# Patient Record
Sex: Male | Born: 1945 | Race: White | Hispanic: No | Marital: Married | Smoking: Never smoker
Health system: Southern US, Community
[De-identification: ages and names within clinical notes are randomized; demographics above are authoritative.]

## PROBLEM LIST (undated history)

## (undated) DIAGNOSIS — R7989 Other specified abnormal findings of blood chemistry: Secondary | ICD-10-CM

## (undated) DIAGNOSIS — E74818 Other disorders of glucose transport: Secondary | ICD-10-CM

## (undated) DIAGNOSIS — L4050 Arthropathic psoriasis, unspecified: Secondary | ICD-10-CM

## (undated) DIAGNOSIS — E748 Other specified disorders of carbohydrate metabolism: Secondary | ICD-10-CM

## (undated) DIAGNOSIS — I10 Essential (primary) hypertension: Secondary | ICD-10-CM

## (undated) DIAGNOSIS — M1990 Unspecified osteoarthritis, unspecified site: Secondary | ICD-10-CM

## (undated) DIAGNOSIS — E291 Testicular hypofunction: Secondary | ICD-10-CM

## (undated) DIAGNOSIS — E785 Hyperlipidemia, unspecified: Secondary | ICD-10-CM

## (undated) DIAGNOSIS — Z860100 Personal history of colon polyps, unspecified: Secondary | ICD-10-CM

## (undated) DIAGNOSIS — K5010 Crohn's disease of large intestine without complications: Secondary | ICD-10-CM

## (undated) DIAGNOSIS — M810 Age-related osteoporosis without current pathological fracture: Secondary | ICD-10-CM

## (undated) DIAGNOSIS — R945 Abnormal results of liver function studies: Secondary | ICD-10-CM

## (undated) HISTORY — DX: Age-related osteoporosis without current pathological fracture: M81.0

## (undated) HISTORY — DX: Unspecified osteoarthritis, unspecified site: M19.90

## (undated) HISTORY — DX: Testicular hypofunction: E29.1

## (undated) HISTORY — DX: Personal history of colonic polyps: Z86.010

## (undated) HISTORY — DX: Other disorders of glucose transport: E74.818

## (undated) HISTORY — DX: Personal history of colon polyps, unspecified: Z86.0100

## (undated) HISTORY — DX: Abnormal results of liver function studies: R94.5

## (undated) HISTORY — DX: Hyperlipidemia, unspecified: E78.5

## (undated) HISTORY — PX: POLYPECTOMY: SHX149

## (undated) HISTORY — DX: Other specified disorders of carbohydrate metabolism: E74.8

## (undated) HISTORY — DX: Other specified abnormal findings of blood chemistry: R79.89

## (undated) HISTORY — DX: Arthropathic psoriasis, unspecified: L40.50

## (undated) HISTORY — DX: Crohn's disease of large intestine without complications: K50.10

## (undated) HISTORY — DX: Essential (primary) hypertension: I10

## (undated) HISTORY — PX: COLONOSCOPY: SHX174

## (undated) HISTORY — PX: TONSILLECTOMY: SUR1361

---

## 2004-09-03 ENCOUNTER — Ambulatory Visit: Payer: Self-pay | Admitting: Gastroenterology

## 2004-09-18 ENCOUNTER — Ambulatory Visit: Payer: Self-pay | Admitting: Gastroenterology

## 2008-12-01 ENCOUNTER — Ambulatory Visit (HOSPITAL_COMMUNITY): Admission: RE | Admit: 2008-12-01 | Discharge: 2008-12-01 | Payer: Self-pay | Admitting: Internal Medicine

## 2009-08-22 ENCOUNTER — Encounter (INDEPENDENT_AMBULATORY_CARE_PROVIDER_SITE_OTHER): Payer: Self-pay | Admitting: *Deleted

## 2009-11-22 ENCOUNTER — Encounter: Payer: Self-pay | Admitting: Gastroenterology

## 2009-11-27 ENCOUNTER — Encounter (INDEPENDENT_AMBULATORY_CARE_PROVIDER_SITE_OTHER): Payer: Self-pay | Admitting: *Deleted

## 2010-01-22 ENCOUNTER — Encounter (INDEPENDENT_AMBULATORY_CARE_PROVIDER_SITE_OTHER): Payer: Self-pay | Admitting: *Deleted

## 2010-01-24 ENCOUNTER — Ambulatory Visit: Payer: Self-pay | Admitting: Gastroenterology

## 2010-02-13 ENCOUNTER — Ambulatory Visit: Payer: Self-pay | Admitting: Gastroenterology

## 2010-07-03 NOTE — Miscellaneous (Signed)
Summary: [facility] Previsit/prep  Clinical Lists Changes  Medications: Added new medication of MOVIPREP 100 GM  SOLR (PEG-KCL-NACL-NASULF-NA ASC-C) As per prep instructions. - Signed Rx of MOVIPREP 100 GM  SOLR (PEG-KCL-NACL-NASULF-NA ASC-C) As per prep instructions.;  #1 x 0;  Signed;  Entered by: [name] RN;  Authorized by: Inda Castle MD;  Method used: Electronically to [facility]. # [phone]*, [address], [place], [place], [place]  [address], Ph: [phone], Fax: [phone] Observations: Added new observation of NKA: T (01/24/2010 8:49)    Prescriptions: MOVIPREP 100 GM  SOLR (PEG-KCL-NACL-NASULF-NA ASC-C) As per prep instructions.  #1 x 0   Entered by:   [name] RN   Authorized by:   Inda Castle MD   Signed by:   [name] RN on 01/24/2010   Method used:   Electronically to        [facility]. # [phone]* (retail)       [address] [place], [place]  [address]       Ph: [phone]       Fax: [phone]   RxID:   [id number]

## 2010-07-03 NOTE — Procedures (Signed)
Summary: Colonoscopy  Patient: Melvin Shelton Note: All result statuses are Final unless otherwise noted.  Tests: (1) Colonoscopy (COL)   COL Colonoscopy           [facility].     [place], [place]  [address]           COLONOSCOPY PROCEDURE REPORT           PATIENT:  Melvin, Shelton  MR#:  [id number]     BIRTHDATE:  January 01, 1946, 34 yrs. old  GENDER:  male           ENDOSCOPIST:  Mister Krahenbuhl. Deatra Ina, MD     Referred by:           PROCEDURE DATE:  02/13/2010     PROCEDURE:  Diagnostic Colonoscopy     ASA CLASS:  Class I     INDICATIONS:  1) screening  2) history of pre-cancerous     (adenomatous) colon polyps           MEDICATIONS:   Fentanyl 75 mcg IV, Versed 7 mg IV           DESCRIPTION OF PROCEDURE:   After the risks benefits and     alternatives of the procedure were thoroughly explained, informed     consent was obtained.  Digital rectal exam was performed and     revealed no abnormalities.   The [id number] endoscope was     introduced through the anus and advanced to the cecum, which was     identified by both the appendix and ileocecal valve, without     limitations.  The quality of the prep was excellent, using     MoviPrep.  The instrument was then slowly withdrawn as the colon     was fully examined.     <<PROCEDUREIMAGES>>           FINDINGS:  Mild diverticulosis was found in the sigmoid colon (see     image16).  This was otherwise a normal examination of the colon     (see image1, image3, image5, image6, image7, image10, image12,     image14, image19, and image20).   Retroflexed views in the rectum     revealed no abnormalities.    The time to cecum =  3.0  minutes.     The scope was then withdrawn (time =  7.50  min) from the patient     and the procedure completed.           COMPLICATIONS:  None           ENDOSCOPIC IMPRESSION:     1) Mild diverticulosis in the sigmoid colon     2) Otherwise normal examination  RECOMMENDATIONS:     1) Colonoscopy           REPEAT EXAM:  In 7 year(s) for Colonoscopy.           ______________________________     Sandy Salaam. Deatra Ina, MD           CC: Rachel Moulds, DO           n.     eSIGNED:   Sandy Salaam. Kaplan at 02/13/2010 02:35 PM           Kennieth Francois, [id number]  Note: An exclamation mark (!) indicates a result that was not dispersed into the flowsheet. Document Creation Date: 02/13/2010 2:35 PM _______________________________________________________________________  (1)  Order result status: Final Collection or observation date-time: 02/13/2010 14:29 Requested date-time:  Receipt date-time:  Reported date-time:  Referring Physician:   Ordering Physician: Erskine Emery [phone]) Specimen Source:  Source: Tawanna Cooler Order Number: [id number] Lab site:   Appended Document: Colonoscopy    Clinical Lists Changes  Observations: Added new observation of COLONNXTDUE: 02/2017 (02/13/2010 14:43)

## 2010-07-03 NOTE — Letter (Signed)
Summary: Colonoscopy Letter  [facility]  [place], [place] [address]   Phone: [phone]  Fax: [phone]      August 22, 2009 MRN: [id number]   Melvin Shelton [place], [place]  [address]   Dear Mr. Canavan,   According to your medical record, it is time for you to schedule a Colonoscopy. The American Cancer Society recommends this procedure as a method to detect early colon cancer. Patients with a family history of colon cancer, or a personal history of colon polyps or inflammatory bowel disease are at increased risk.  This letter has been generated based on the recommendations made at the time of your procedure. If you feel that in your particular situation this may no longer apply, please contact our office.  Please call our office at [phone] to schedule this appointment or to update your records at your earliest convenience.  Thank you for cooperating with [place] to provide you with the very best care possible.   Sincerely,  Sandy Salaam. Deatra Ina, M.D.  [facility] Gastroenterology Division [phone]

## 2010-07-03 NOTE — Letter (Signed)
Summary: [facility] Instructions  [facility]  [place], [place] [address]   Phone: [phone]  Fax: [phone]       Melvin Shelton    05/11/1946    MRN: [id number]        Procedure Day Melvin Shelton:  Melvin Shelton  02/13/10     Arrival Time:  1:00PM     Procedure Time:  2:00PM     Location of Procedure:                    _X _  [name] (4th Floor)                       [place]   Starting 5 days prior to your procedure 02/08/10 do not eat nuts, seeds, popcorn, corn, beans, peas,  salads, or any raw vegetables.  Do not take any fiber supplements (e.g. Metamucil, Citrucel, and Benefiber).  THE DAY BEFORE YOUR PROCEDURE         DATE:  02/12/10  DAY: MONDAY  1.  Drink clear liquids the entire day-NO SOLID FOOD  2.  Do not drink anything colored red or purple.  Avoid juices with pulp.  No orange juice.  3.  Drink at least 64 oz. (8 glasses) of fluid/clear liquids during the day to prevent dehydration and help the prep work efficiently.  CLEAR LIQUIDS INCLUDE: Water Jello Ice Popsicles Tea (sugar ok, no milk/cream) Powdered fruit flavored drinks Coffee (sugar ok, no milk/cream) Gatorade Juice: apple, white grape, white cranberry  Lemonade Clear bullion, consomm, broth Carbonated beverages (any kind) Strained chicken noodle soup Hard Candy                             4.  In the morning, mix first dose of MoviPrep solution:    Empty 1 Pouch A and 1 Pouch B into the disposable container    Add lukewarm drinking water to the top line of the container. Mix to dissolve    Refrigerate (mixed solution should be used within 24 hrs)  5.  Begin drinking the prep at 5:00 p.m. The MoviPrep container is divided by 4 marks.   Every 15 minutes drink the solution down to the next mark (approximately 8 oz) until the full liter is complete.   6.  Follow completed prep with 16 oz of clear liquid of your choice (Nothing  red or purple).  Continue to drink clear liquids until bedtime.  7.  Before going to bed, mix second dose of MoviPrep solution:    Empty 1 Pouch A and 1 Pouch B into the disposable container    Add lukewarm drinking water to the top line of the container. Mix to dissolve    Refrigerate  THE DAY OF YOUR PROCEDURE      DATE: 02/13/10  DAY:  TUESDAY  Beginning at 9:00AM (5 hours before procedure):         1. Every 15 minutes, drink the solution down to the next mark (approx 8 oz) until the full liter is complete.  2. Follow completed prep with 16 oz. of clear liquid of your choice.    3. You may drink clear liquids until 12:00PM (2 HOURS BEFORE PROCEDURE).   MEDICATION INSTRUCTIONS  Unless otherwise instructed, you should take regular prescription medications with a small sip of water   as early as possible the  morning of your procedure.        OTHER INSTRUCTIONS  You will need a responsible adult at least 63 years of age to accompany you and drive you home.   This person must remain in the waiting room during your procedure.  Wear loose fitting clothing that is easily removed.  Leave jewelry and other valuables at home.  However, you may wish to bring a book to read or  an iPod/MP3 player to listen to music as you wait for your procedure to start.  Remove all body piercing jewelry and leave at home.  Total time from sign-in until discharge is approximately 2-3 hours.  You should go home directly after your procedure and rest.  You can resume normal activities the  day after your procedure.  The day of your procedure you should not:   Drive   Make legal decisions   Operate machinery   Drink alcohol   Return to work  You will receive specific instructions about eating, activities and medications before you leave.    The above instructions have been reviewed and explained to me by  Melvin Monte RN  January 24, 2010 9:17 AM     I fully understand and can  verbalize these instructions _____________________________ Date _________

## 2010-07-03 NOTE — Letter (Signed)
Summary: Previsit letter  [facility]  [place], [place] [address]   Phone: [phone]  Fax: [phone]       11/27/2009 MRN: [id number]  [facility] [place], [place]  [address]  Dear Melvin Shelton,  Welcome to the Gastroenterology Division at [facility].    You are scheduled to see a nurse for your pre-procedure visit on 01-24-10 at [facility] on the 3rd floor at [facility] [place], [place] [employer].  We ask that you try to arrive at our office 15 minutes prior to your appointment time to allow for check-in.  Your nurse visit will consist of discussing your medical and surgical history, your immediate family medical history, and your medications.    Please bring a complete list of all your medications or, if you prefer, bring the medication bottles and we will list them.  We will need to be aware of both prescribed and over the counter drugs.  We will need to know exact dosage information as well.  If you are on blood thinners (Coumadin, Plavix, Aggrenox, Ticlid, etc.) please call our office today/prior to your appointment, as we need to consult with your physician about holding your medication.   Please be prepared to read and sign documents such as consent forms, a financial agreement, and acknowledgement forms.  If necessary, and with your consent, a friend or relative is welcome to sit-in on the nurse visit with you.  Please bring your insurance card so that we may make a copy of it.  If your insurance requires a referral to see a specialist, please bring your referral form from your primary care physician.  No co-pay is required for this nurse visit.     If you cannot keep your appointment, please call [phone] to cancel or reschedule prior to your appointment date.  This allows [place] the opportunity to schedule an appointment for another patient in need of care.    Thank you for choosing [facility] for your medical  needs.  We appreciate the opportunity to care for you.  Please visit [place] at our website  to learn more about our practice.                     Sincerely.                                                                                                                   The Gastroenterology Division

## 2010-07-03 NOTE — Letter (Signed)
Summary: [facility]  [facility]   Imported By: Phillis Knack 11/30/2009 09:30:45  _____________________________________________________________________  External Attachment:    Type:   Image     Comment:   External Document

## 2011-06-17 ENCOUNTER — Ambulatory Visit (HOSPITAL_COMMUNITY)
Admission: RE | Admit: 2011-06-17 | Discharge: 2011-06-17 | Disposition: A | Discharge status: Home / Self Care | Payer: BC Managed Care – PPO | Source: Ambulatory Visit | Attending: Internal Medicine | Admitting: Internal Medicine

## 2011-06-17 ENCOUNTER — Other Ambulatory Visit (HOSPITAL_COMMUNITY): Payer: Self-pay | Admitting: Internal Medicine

## 2011-06-17 DIAGNOSIS — R059 Cough, unspecified: Secondary | ICD-10-CM | POA: Insufficient documentation

## 2011-06-17 DIAGNOSIS — R05 Cough: Secondary | ICD-10-CM

## 2013-04-22 ENCOUNTER — Other Ambulatory Visit: Payer: Self-pay | Admitting: Emergency Medicine

## 2013-04-22 MED ORDER — CLOBETASOL PROP EMOLLIENT BASE 0.05 % EX CREA: TOPICAL_CREAM | CUTANEOUS | Status: DC

## 2013-05-08 IMAGING — CR DG CHEST 2V
2 series · 2 of 2 positions shown · non-contrast
Comparison: None.

CLINICAL DATA: Cough

CHEST - 2 VIEW

[view not recorded (1 of 2)]
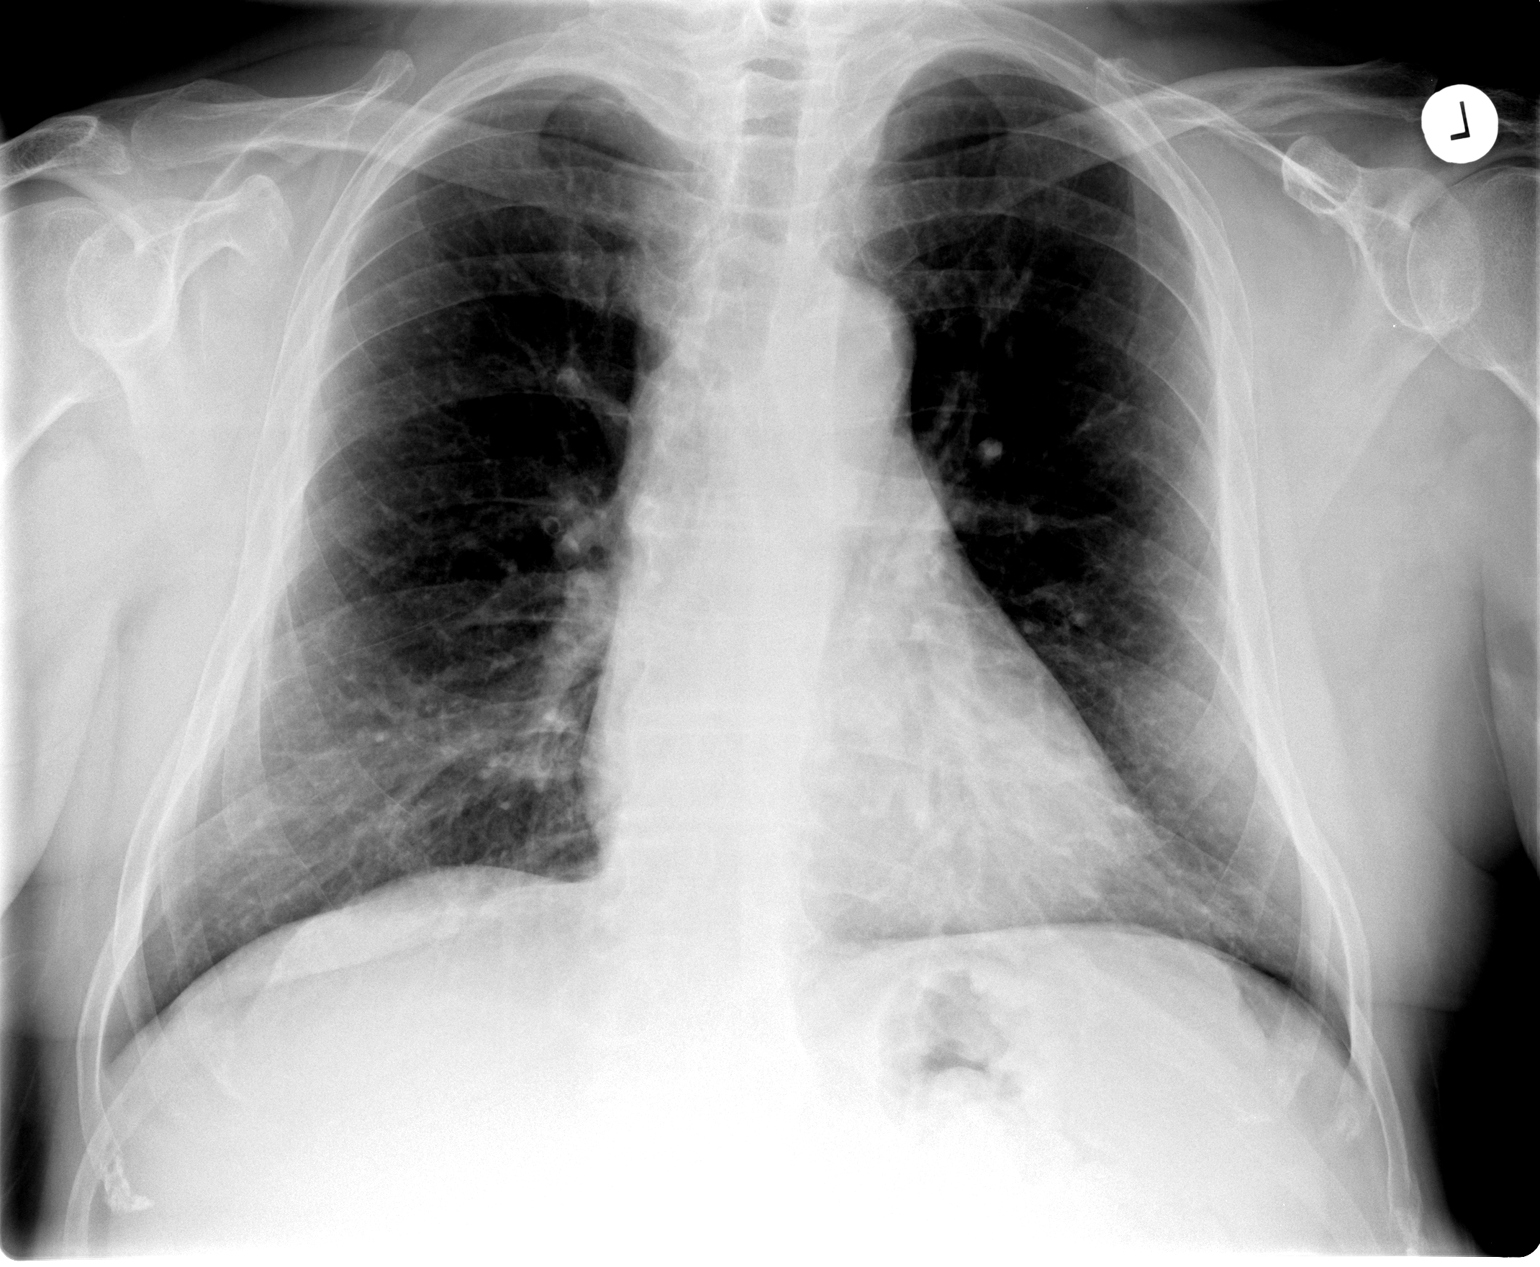

[view not recorded (2 of 2)]
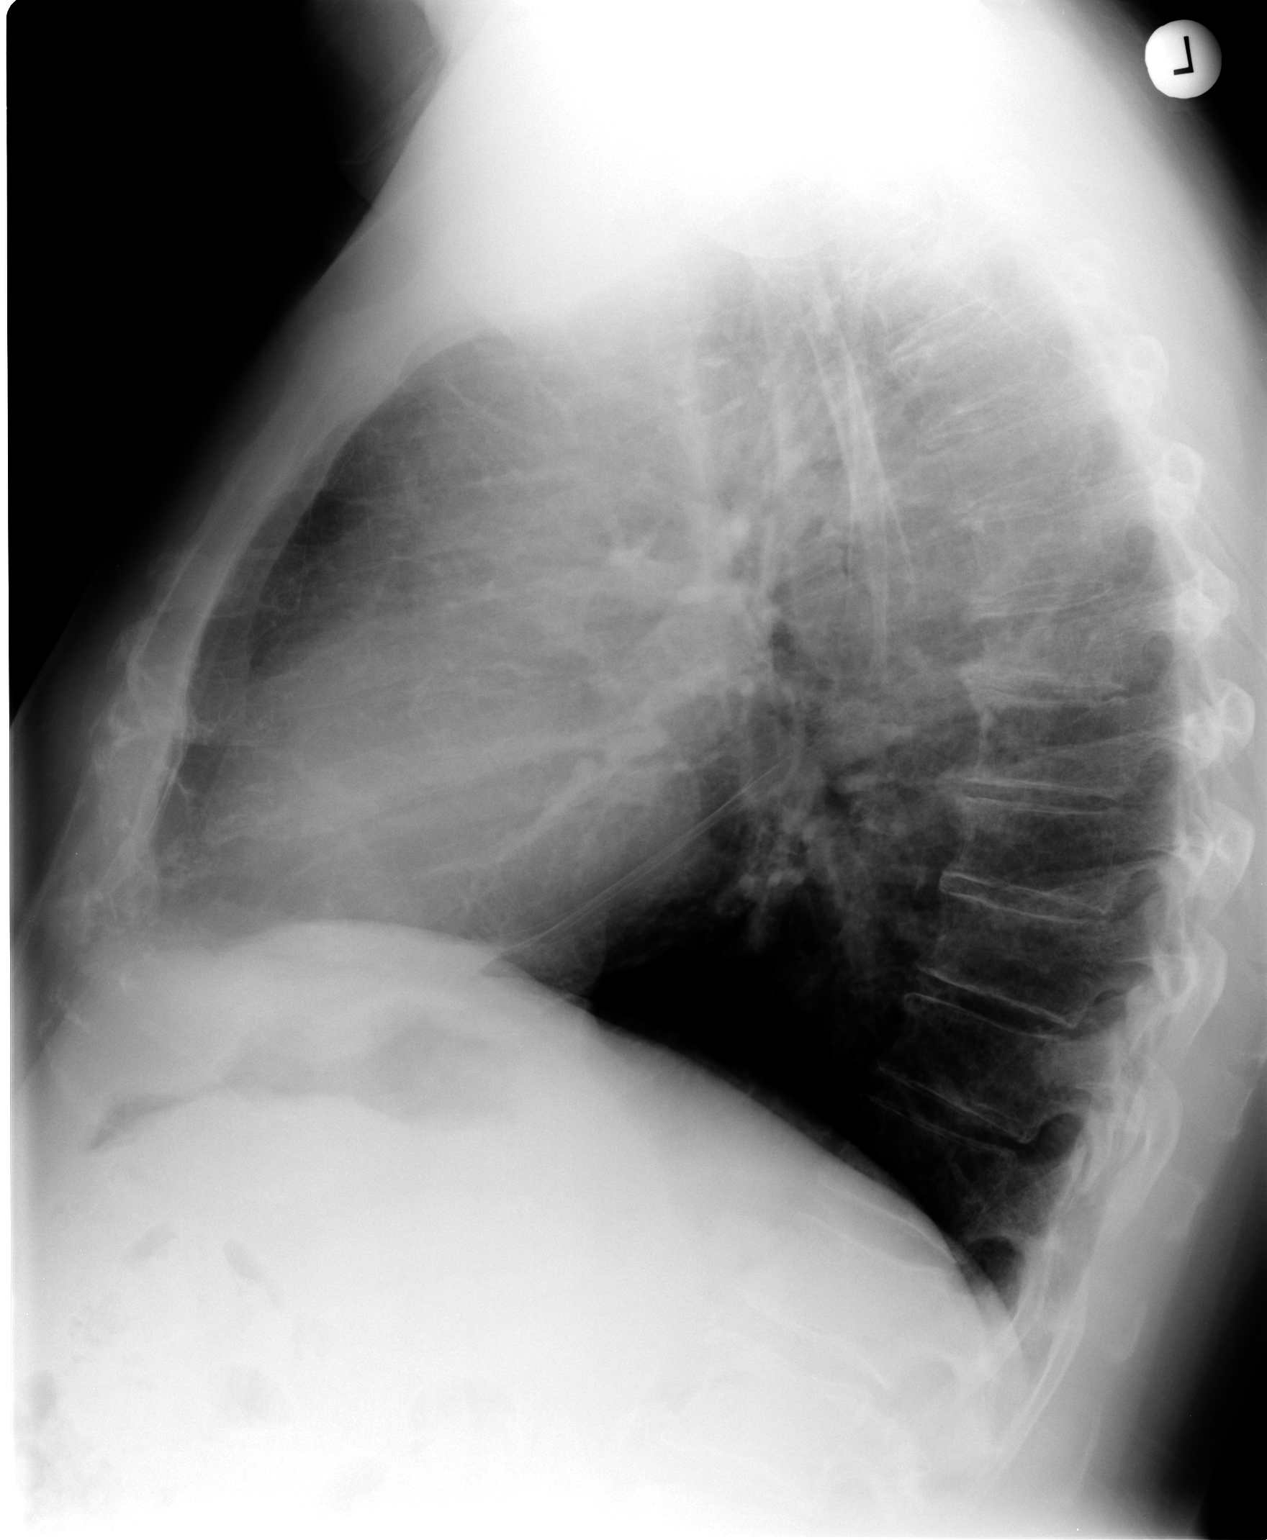

[2 of 2 positions shown; findings below may reference images not displayed]

FINDINGS: Heart size is normal.  Mediastinal shadows are normal.
The lungs are clear.  No effusions.  Ordinary degenerative changes
effect the spine.
IMPRESSION: No active disease

## 2013-06-16 ENCOUNTER — Other Ambulatory Visit: Payer: Self-pay | Admitting: Internal Medicine

## 2013-08-11 ENCOUNTER — Other Ambulatory Visit: Payer: Self-pay | Admitting: Emergency Medicine

## 2013-09-06 ENCOUNTER — Encounter: Payer: Self-pay | Admitting: *Deleted

## 2013-09-06 DIAGNOSIS — K760 Fatty (change of) liver, not elsewhere classified: Secondary | ICD-10-CM | POA: Insufficient documentation

## 2013-09-06 DIAGNOSIS — M810 Age-related osteoporosis without current pathological fracture: Secondary | ICD-10-CM | POA: Insufficient documentation

## 2013-09-06 DIAGNOSIS — L4050 Arthropathic psoriasis, unspecified: Secondary | ICD-10-CM | POA: Insufficient documentation

## 2013-09-06 DIAGNOSIS — E785 Hyperlipidemia, unspecified: Secondary | ICD-10-CM | POA: Insufficient documentation

## 2013-09-06 DIAGNOSIS — E1169 Type 2 diabetes mellitus with other specified complication: Secondary | ICD-10-CM | POA: Insufficient documentation

## 2013-09-06 DIAGNOSIS — R7989 Other specified abnormal findings of blood chemistry: Secondary | ICD-10-CM | POA: Insufficient documentation

## 2013-09-06 DIAGNOSIS — R945 Abnormal results of liver function studies: Secondary | ICD-10-CM

## 2013-09-06 DIAGNOSIS — Z86010 Personal history of colonic polyps: Secondary | ICD-10-CM | POA: Insufficient documentation

## 2013-09-06 DIAGNOSIS — I10 Essential (primary) hypertension: Secondary | ICD-10-CM | POA: Insufficient documentation

## 2013-09-08 ENCOUNTER — Ambulatory Visit: Payer: Self-pay | Admitting: Emergency Medicine

## 2013-09-16 ENCOUNTER — Other Ambulatory Visit: Payer: Self-pay | Admitting: Emergency Medicine

## 2013-09-22 ENCOUNTER — Ambulatory Visit (INDEPENDENT_AMBULATORY_CARE_PROVIDER_SITE_OTHER): Payer: Medicare Other | Admitting: Emergency Medicine

## 2013-09-22 ENCOUNTER — Encounter: Payer: Self-pay | Admitting: Emergency Medicine

## 2013-09-22 VITALS — BP 138/90 | HR 80 | Temp 98.0°F | Resp 16 | Ht 69.75 in | Wt 213.0 lb

## 2013-09-22 DIAGNOSIS — L409 Psoriasis, unspecified: Secondary | ICD-10-CM

## 2013-09-22 DIAGNOSIS — E782 Mixed hyperlipidemia: Secondary | ICD-10-CM

## 2013-09-22 DIAGNOSIS — E119 Type 2 diabetes mellitus without complications: Secondary | ICD-10-CM

## 2013-09-22 DIAGNOSIS — E291 Testicular hypofunction: Secondary | ICD-10-CM

## 2013-09-22 DIAGNOSIS — R6889 Other general symptoms and signs: Secondary | ICD-10-CM

## 2013-09-22 DIAGNOSIS — L408 Other psoriasis: Secondary | ICD-10-CM

## 2013-09-22 DIAGNOSIS — I10 Essential (primary) hypertension: Secondary | ICD-10-CM

## 2013-09-22 LAB — BASIC METABOLIC PANEL WITH GFR
BUN: 7 mg/dL (ref 6–23)
CHLORIDE: 99 meq/L (ref 96–112)
CO2: 28 meq/L (ref 19–32)
Calcium: 9.8 mg/dL (ref 8.4–10.5)
Creat: 0.87 mg/dL (ref 0.50–1.35)
GFR, Est African American: >89 mL/min
GLUCOSE: 109 mg/dL — AB (ref 70–99)
POTASSIUM: 4.3 meq/L (ref 3.5–5.3)
Sodium: 136 mEq/L (ref 135–145)

## 2013-09-22 LAB — HEPATIC FUNCTION PANEL
ALBUMIN: 4.4 g/dL (ref 3.5–5.2)
ALT: 65 U/L — AB (ref 0–53)
AST: 36 U/L (ref 0–37)
Alkaline Phosphatase: 48 U/L (ref 39–117)
Bilirubin, Direct: 0.2 mg/dL (ref 0.0–0.3)
Indirect Bilirubin: 1.2 mg/dL (ref 0.2–1.2)
TOTAL PROTEIN: 7.2 g/dL (ref 6.0–8.3)
Total Bilirubin: 1.4 mg/dL — ABNORMAL HIGH (ref 0.2–1.2)

## 2013-09-22 LAB — LIPID PANEL
CHOLESTEROL: 193 mg/dL (ref 0–200)
HDL: 42 mg/dL (ref >39)
LDL Cholesterol: 101 mg/dL — ABNORMAL HIGH (ref 0–99)
Total CHOL/HDL Ratio: 4.6 Ratio
Triglycerides: 250 mg/dL — ABNORMAL HIGH (ref <150)
VLDL: 50 mg/dL — AB (ref 0–40)

## 2013-09-22 NOTE — Progress Notes (Signed)
Subjective:    Patient ID: Melvin Shelton, male    DOB: [birth year], 68 y.o.   MRN: [id number]  HPI Comments: 68 yo WM presents for 3 month F/U for HTN, Cholesterol, Pre-Dm, D. Deficient. Last labs MALBU 44.6 T 178 TG 335 H 40 L 71 A1C 6.7 MAG 2.0 INSULIN 34 D 87. He has been continuing to improve diet and increase activity as permitted by schedule. He has maintained weight but is trying to lose more. He notes BP is good at home. He has noticed increase energy level with testosterone but concerned with expense with no insurance coverage. He trie to use QOD to decrease cost.  He has had a mildly elevated Malbu and has been trying to increase H2o.  HE HAS BEEN USING PEN VK FOR RASH AND HAS HAD IMPROVEMENT WITH SKIN, WITH PSORIASIS AND ARTHRITIS per Derm and is off Enbrel.    Hypertension  Hyperlipidemia      Medication List       This list is accurate as of: 09/22/13 11:45 AM.  Always use your most recent med list.               ALPRAZolam 0.5 MG tablet  Commonly known as:  XANAX  Take 0.5 mg by mouth 3 (three) times daily as needed for anxiety.     amLODipine 2.5 MG tablet  Commonly known as:  NORVASC  Take 2.5 mg by mouth daily.     aspirin 81 MG tablet  Take 81 mg by mouth daily.     atorvastatin 10 MG tablet  Commonly known as:  LIPITOR  Take 10 mg by mouth daily.     CALCIUM 600 PO  Take by mouth daily.     Clobetasol Prop Emollient Base 0.05 % emollient cream  Apply AD     dexamethasone 0.5 MG tablet  Commonly known as:  DECADRON  Take 0.5 mg by mouth as needed.     Fish Oil 1200 MG Caps  Take by mouth 3 (three) times daily.     FORTESTA 10 MG/ACT (2%) Gel  Generic drug:  Testosterone  Place onto the skin as directed.     Magnesium 500 MG Caps  Take 500 mg by mouth daily.     multivitamin tablet  Take 1 tablet by mouth daily.     penicillin v potassium 500 MG tablet  Commonly known as:  VEETID  Take 500 mg by mouth 2 (two) times daily.     quinapril 40 MG tablet  Commonly known as:  ACCUPRIL  TAKE 1 TABLET BY MOUTH EVERY DAY FOR HIGH BLOOD PRESSURE       No Known Allergies Past Medical History  Diagnosis Date  . Hypertension   . Psoriatic arthritis   . Hyperlipidemia   . Hypogonadism male   . Osteoporosis   . Renal glycosuria   . Elevated LFTs   . Hx of colonic polyp      Review of Systems  All other systems reviewed and are negative.  BP 138/90  Pulse 80  Temp(Src) 98 F (36.7 C) (Temporal)  Resp 16  Ht 5' 9.75" (1.772 m)  Wt 213 lb (96.616 kg)  BMI 30.77 kg/m2     Objective:   Physical Exam  Nursing note and vitals reviewed. Constitutional: He is oriented to person, place, and time. He appears well-developed and well-nourished.  HENT:  Head: Normocephalic and atraumatic.  Right Ear: External ear normal.  Left Ear: External ear  normal.  Nose: Nose normal.  Eyes: Conjunctivae and EOM are normal.  Neck: Normal range of motion. Neck supple. No JVD present. No thyromegaly present.  Cardiovascular: Normal rate, regular rhythm, normal heart sounds and intact distal pulses.   Pulmonary/Chest: Effort normal and breath sounds normal.  Abdominal: Soft. Bowel sounds are normal. He exhibits no distension and no mass. There is no tenderness. There is no rebound and no guarding.  Musculoskeletal: Normal range of motion. He exhibits no edema and no tenderness.  Lymphadenopathy:    He has no cervical adenopathy.  Neurological: He is alert and oriented to person, place, and time. He has normal reflexes. No cranial nerve deficit. Coordination normal.  Skin: Skin is warm and dry.  Psychiatric: He has a normal mood and affect. His behavior is normal. Judgment and thought content normal.          Assessment & Plan:  1.  3 month F/U for HTN, Cholesterol,DM, D. Deficient. Needs healthy diet, cardio QD and obtain healthy weight. Check Labs, Check BP if >130/80 call office, Check BS if >200 call office  2.  Psoriasis- Continue Pen VK AD and FO  3. Hypogonadism- Check labs  4. malbu elevation- recheck labs

## 2013-09-22 NOTE — Patient Instructions (Signed)
Diabetes and Exercise Exercising regularly is important. It is not just about losing weight. It has many health benefits, such as:  Improving your overall fitness, flexibility, and endurance.  Increasing your bone density.  Helping with weight control.  Decreasing your body fat.  Increasing your muscle strength.  Reducing stress and tension.  Improving your overall health. People with diabetes who exercise gain additional benefits because exercise:  Reduces appetite.  Improves the body's use of blood sugar (glucose).  Helps lower or control blood glucose.  Decreases blood pressure.  Helps control blood lipids (such as cholesterol and triglycerides).  Improves the body's use of the hormone insulin by:  Increasing the body's insulin sensitivity.  Reducing the body's insulin needs.  Decreases the risk for heart disease because exercising:  Lowers cholesterol and triglycerides levels.  Increases the levels of good cholesterol (such as high-density lipoproteins [HDL]) in the body.  Lowers blood glucose levels. YOUR ACTIVITY PLAN  Choose an activity that you enjoy and set realistic goals. Your health care provider or diabetes educator can help you make an activity plan that works for you. You can break activities into 2 or 3 sessions throughout the day. Doing so is as good as one long session. Exercise ideas include:  Taking the dog for a walk.  Taking the stairs instead of the elevator.  Dancing to your favorite song.  Doing your favorite exercise with a friend. RECOMMENDATIONS FOR EXERCISING WITH TYPE 1 OR TYPE 2 DIABETES   Check your blood glucose before exercising. If blood glucose levels are greater than 240 mg/dL, check for urine ketones. Do not exercise if ketones are present.  Avoid injecting insulin into areas of the body that are going to be exercised. For example, avoid injecting insulin into:  The arms when playing tennis.  The legs when  jogging.  Keep a record of:  Food intake before and after you exercise.  Expected peak times of insulin action.  Blood glucose levels before and after you exercise.  The type and amount of exercise you have done.  Review your records with your health care provider. Your health care provider will help you to develop guidelines for adjusting food intake and insulin amounts before and after exercising.  If you take insulin or oral hypoglycemic agents, watch for signs and symptoms of hypoglycemia. They include:  Dizziness.  Shaking.  Sweating.  Chills.  Confusion.  Drink plenty of water while you exercise to prevent dehydration or heat stroke. Body water is lost during exercise and must be replaced.  Talk to your health care provider before starting an exercise program to make sure it is safe for you. Remember, almost any type of activity is better than none. Document Released: 08/10/2003 Document Revised: 01/20/2013 Document Reviewed: 10/27/2012 [facility] Patient Information 2014 [place].

## 2013-09-23 ENCOUNTER — Other Ambulatory Visit: Payer: Self-pay | Admitting: Emergency Medicine

## 2013-09-23 LAB — CBC WITH DIFFERENTIAL/PLATELET
BASOS ABS: 0 10*3/uL (ref 0.0–0.1)
Basophils Relative: 0 % (ref 0–1)
Eosinophils Absolute: 0.1 10*3/uL (ref 0.0–0.7)
Eosinophils Relative: 1 % (ref 0–5)
HEMATOCRIT: 53.4 % — AB (ref 39.0–52.0)
Hemoglobin: 18.5 g/dL — ABNORMAL HIGH (ref 13.0–17.0)
LYMPHS PCT: 26 % (ref 12–46)
Lymphs Abs: 2.7 10*3/uL (ref 0.7–4.0)
MCH: 32 pg (ref 26.0–34.0)
MCHC: 34.6 g/dL (ref 30.0–36.0)
MCV: 92.4 fL (ref 78.0–100.0)
MONO ABS: 0.9 10*3/uL (ref 0.1–1.0)
Monocytes Relative: 9 % (ref 3–12)
NEUTROS ABS: 6.5 10*3/uL (ref 1.7–7.7)
Neutrophils Relative %: 64 % (ref 43–77)
Platelets: 265 10*3/uL (ref 150–400)
RBC: 5.78 MIL/uL (ref 4.22–5.81)
RDW: 14.8 % (ref 11.5–15.5)
WBC: 10.2 10*3/uL (ref 4.0–10.5)

## 2013-09-23 LAB — HEMOGLOBIN A1C
Hgb A1c MFr Bld: 6.6 % — ABNORMAL HIGH (ref <5.7)
MEAN PLASMA GLUCOSE: 143 mg/dL — AB (ref <117)

## 2013-09-23 LAB — TESTOSTERONE: Testosterone: 423 ng/dL (ref 300–890)

## 2013-09-23 LAB — MICROALBUMIN / CREATININE URINE RATIO
CREATININE, URINE: 118.9 mg/dL
MICROALB UR: 3.79 mg/dL — AB (ref 0.00–1.89)
Microalb Creat Ratio: 31.9 mg/g — ABNORMAL HIGH (ref 0.0–30.0)

## 2013-09-23 LAB — INSULIN, FASTING: Insulin fasting, serum: 26 u[IU]/mL (ref 3–28)

## 2013-09-24 ENCOUNTER — Other Ambulatory Visit: Payer: Self-pay | Admitting: Emergency Medicine

## 2013-09-24 ENCOUNTER — Encounter: Payer: Self-pay | Admitting: Emergency Medicine

## 2013-09-24 MED ORDER — METFORMIN HCL ER 500 MG PO TB24: ORAL_TABLET | ORAL | Status: DC

## 2013-10-01 ENCOUNTER — Other Ambulatory Visit: Payer: Self-pay | Admitting: Internal Medicine

## 2013-10-19 ENCOUNTER — Other Ambulatory Visit: Payer: Self-pay

## 2013-10-21 ENCOUNTER — Other Ambulatory Visit: Payer: Self-pay | Admitting: Emergency Medicine

## 2013-10-21 DIAGNOSIS — R6889 Other general symptoms and signs: Secondary | ICD-10-CM

## 2013-10-26 ENCOUNTER — Other Ambulatory Visit: Payer: Medicare Other

## 2013-10-26 DIAGNOSIS — R6889 Other general symptoms and signs: Secondary | ICD-10-CM

## 2013-10-26 LAB — HEPATIC FUNCTION PANEL
ALT: 64 U/L — AB (ref 0–53)
AST: 32 U/L (ref 0–37)
Albumin: 4.3 g/dL (ref 3.5–5.2)
Alkaline Phosphatase: 46 U/L (ref 39–117)
Bilirubin, Direct: 0.2 mg/dL (ref 0.0–0.3)
Indirect Bilirubin: 0.9 mg/dL (ref 0.2–1.2)
TOTAL PROTEIN: 7 g/dL (ref 6.0–8.3)
Total Bilirubin: 1.1 mg/dL (ref 0.2–1.2)

## 2013-10-27 ENCOUNTER — Other Ambulatory Visit: Payer: Self-pay | Admitting: Emergency Medicine

## 2013-10-27 ENCOUNTER — Encounter: Payer: Self-pay | Admitting: Emergency Medicine

## 2013-10-27 DIAGNOSIS — R6889 Other general symptoms and signs: Secondary | ICD-10-CM

## 2013-10-27 LAB — MICROALBUMIN / CREATININE URINE RATIO
CREATININE, URINE: 228.5 mg/dL
MICROALB UR: 13.89 mg/dL — AB (ref 0.00–1.89)
Microalb Creat Ratio: 60.8 mg/g — ABNORMAL HIGH (ref 0.0–30.0)

## 2013-11-01 ENCOUNTER — Other Ambulatory Visit: Payer: BC Managed Care – PPO

## 2013-11-09 ENCOUNTER — Ambulatory Visit (INDEPENDENT_AMBULATORY_CARE_PROVIDER_SITE_OTHER): Payer: Medicare Other | Admitting: Emergency Medicine

## 2013-11-09 ENCOUNTER — Encounter: Payer: Self-pay | Admitting: Emergency Medicine

## 2013-11-09 VITALS — BP 140/82 | HR 78 | Temp 98.0°F | Resp 16 | Ht 69.75 in | Wt 217.0 lb

## 2013-11-09 DIAGNOSIS — R6889 Other general symptoms and signs: Secondary | ICD-10-CM

## 2013-11-09 LAB — BASIC METABOLIC PANEL WITH GFR
BUN: 5 mg/dL — AB (ref 6–23)
CO2: 24 mEq/L (ref 19–32)
CREATININE: 0.78 mg/dL (ref 0.50–1.35)
Calcium: 9.3 mg/dL (ref 8.4–10.5)
Chloride: 104 mEq/L (ref 96–112)
Glucose, Bld: 92 mg/dL (ref 70–99)
Potassium: 3.8 mEq/L (ref 3.5–5.3)
Sodium: 138 mEq/L (ref 135–145)

## 2013-11-09 LAB — HEPATIC FUNCTION PANEL
ALBUMIN: 4.1 g/dL (ref 3.5–5.2)
ALT: 74 U/L — ABNORMAL HIGH (ref 0–53)
AST: 54 U/L — ABNORMAL HIGH (ref 0–37)
Alkaline Phosphatase: 48 U/L (ref 39–117)
Bilirubin, Direct: 0.3 mg/dL (ref 0.0–0.3)
Indirect Bilirubin: 1.7 mg/dL — ABNORMAL HIGH (ref 0.2–1.2)
TOTAL PROTEIN: 6.8 g/dL (ref 6.0–8.3)
Total Bilirubin: 2 mg/dL — ABNORMAL HIGH (ref 0.2–1.2)

## 2013-11-09 NOTE — Progress Notes (Signed)
Subjective:    Patient ID: Melvin Shelton, male    DOB: Jun 03, 1946, 68 y.o.   MRN: [id number]  HPI Comments: 68 yo WM with abnormal labs recheck. He d/c Decadron/ Quinapril/ testosterone metformin AD with abnormal lab with Malbu/ HFP x 2 weeks.He is eating better and keeps busy. He notes he is feeing better with less medicine. He is taking 1/2 Quinapril x couple days with BP mild elevation.   WBC            10.2   09/22/2013 HGB            18.5   09/22/2013 HCT            53.4   09/22/2013 PLT             265   09/22/2013 GLUCOSE         109   09/22/2013 CHOL            193   09/22/2013 TRIG            250   09/22/2013 HDL              42   09/22/2013 LDLCALC         101   09/22/2013 ALT              64   10/26/2013 AST              32   10/26/2013 NA              136   09/22/2013 K               4.3   09/22/2013 CL               99   09/22/2013 CREATININE     0.87   09/22/2013 BUN               7   09/22/2013 CO2              28   09/22/2013 HGBA1C          6.6   09/22/2013 MICROALBUR    13.89   10/26/2013     Medication List       This list is accurate as of: 11/09/13  1:51 PM.  Always use your most recent med list.               ALPRAZolam 0.5 MG tablet  Commonly known as:  XANAX  Take 0.5 mg by mouth 3 (three) times daily as needed for anxiety.     amLODipine 5 MG tablet  Commonly known as:  NORVASC  TAKE 1 TABLET BY MOUTH EVERY DAY     aspirin 81 MG tablet  Take 81 mg by mouth daily.     atorvastatin 10 MG tablet  Commonly known as:  LIPITOR  Take 10 mg by mouth daily.     CALCIUM 600 PO  Take by mouth daily.     Clobetasol Prop Emollient Base 0.05 % emollient cream  Apply AD     dexamethasone 0.5 MG tablet  Commonly known as:  DECADRON  Take 0.5 mg by mouth as needed.     dexamethasone 0.75 MG tablet  Commonly known as:  DECADRON  TAKE 1/2 TO 1 TABLET BY MOUTH EVERY DAY     Fish Oil 1200 MG Caps  Take by mouth 3 (three) times daily.     FORTESTA 10  MG/ACT  (2%) Gel  Generic drug:  Testosterone  Place onto the skin as directed.     Magnesium 500 MG Caps  Take 500 mg by mouth daily.     metFORMIN 500 MG 24 hr tablet  Commonly known as:  GLUCOPHAGE XR  1 tab BID     multivitamin tablet  Take 1 tablet by mouth daily.     penicillin v potassium 500 MG tablet  Commonly known as:  VEETID  Take 500 mg by mouth 2 (two) times daily.     quinapril 40 MG tablet  Commonly known as:  ACCUPRIL  TAKE 1 TABLET BY MOUTH EVERY DAY FOR HIGH BLOOD PRESSURE       No Known Allergies  Past Medical History  Diagnosis Date  . Hypertension   . Psoriatic arthritis   . Hyperlipidemia   . Hypogonadism male   . Osteoporosis   . Renal glycosuria   . Elevated LFTs   . Hx of colonic polyp       Review of Systems  All other systems reviewed and are negative.  BP 140/82  Pulse 78  Temp(Src) 98 F (36.7 C) (Temporal)  Resp 16  Ht 5' 9.75" (1.772 m)  Wt 217 lb (98.431 kg)  BMI 31.35 kg/m2     Objective:   Physical Exam  Nursing note and vitals reviewed. Constitutional: He is oriented to person, place, and time. He appears well-developed and well-nourished.  HENT:  Head: Normocephalic and atraumatic.  Right Ear: External ear normal.  Left Ear: External ear normal.  Nose: Nose normal.  Eyes: Conjunctivae and EOM are normal.  Neck: Normal range of motion. Neck supple. No JVD present. No thyromegaly present.  Cardiovascular: Normal rate, regular rhythm, normal heart sounds and intact distal pulses.   Pulmonary/Chest: Effort normal and breath sounds normal.  Abdominal: Soft. Bowel sounds are normal. He exhibits no distension. There is no tenderness.  Musculoskeletal: Normal range of motion. He exhibits no edema and no tenderness.  Lymphadenopathy:    He has no cervical adenopathy.  Neurological: He is alert and oriented to person, place, and time. No cranial nerve deficit. Coordination normal.  Skin: Skin is warm and dry.  Psychiatric: He  has a normal mood and affect. His behavior is normal. Judgment and thought content normal.          Assessment & Plan:  1. Abnormal labs- Recheck with d/c of all directed meds.   2. HTN- Check BP call if >130/80, increase cardio

## 2013-11-10 LAB — CBC WITH DIFFERENTIAL/PLATELET
BASOS PCT: 1 % (ref 0–1)
Basophils Absolute: 0.1 10*3/uL (ref 0.0–0.1)
Eosinophils Absolute: 0.2 10*3/uL (ref 0.0–0.7)
Eosinophils Relative: 2 % (ref 0–5)
HCT: 50.2 % (ref 39.0–52.0)
HEMOGLOBIN: 17.7 g/dL — AB (ref 13.0–17.0)
LYMPHS ABS: 1.9 10*3/uL (ref 0.7–4.0)
Lymphocytes Relative: 25 % (ref 12–46)
MCH: 33.1 pg (ref 26.0–34.0)
MCHC: 35.3 g/dL (ref 30.0–36.0)
MCV: 93.8 fL (ref 78.0–100.0)
MONOS PCT: 11 % (ref 3–12)
Monocytes Absolute: 0.8 10*3/uL (ref 0.1–1.0)
NEUTROS ABS: 4.6 10*3/uL (ref 1.7–7.7)
NEUTROS PCT: 61 % (ref 43–77)
Platelets: 240 10*3/uL (ref 150–400)
RBC: 5.35 MIL/uL (ref 4.22–5.81)
RDW: 14.4 % (ref 11.5–15.5)
WBC: 7.6 10*3/uL (ref 4.0–10.5)

## 2013-11-10 LAB — MICROALBUMIN / CREATININE URINE RATIO
CREATININE, URINE: 139.6 mg/dL
MICROALB UR: 4.02 mg/dL — AB (ref 0.00–1.89)
Microalb Creat Ratio: 28.8 mg/g (ref 0.0–30.0)

## 2013-11-11 ENCOUNTER — Encounter: Payer: Self-pay | Admitting: Emergency Medicine

## 2013-11-12 ENCOUNTER — Other Ambulatory Visit: Payer: Self-pay | Admitting: Emergency Medicine

## 2013-11-12 DIAGNOSIS — R6889 Other general symptoms and signs: Secondary | ICD-10-CM

## 2013-11-20 ENCOUNTER — Encounter: Payer: Self-pay | Admitting: Emergency Medicine

## 2013-11-22 ENCOUNTER — Other Ambulatory Visit: Payer: Self-pay | Admitting: Emergency Medicine

## 2013-11-22 ENCOUNTER — Encounter: Payer: Self-pay | Admitting: Emergency Medicine

## 2013-11-22 ENCOUNTER — Encounter: Payer: Self-pay | Admitting: Internal Medicine

## 2013-11-22 ENCOUNTER — Ambulatory Visit
Admission: RE | Admit: 2013-11-22 | Discharge: 2013-11-22 | Disposition: A | Discharge status: Home / Self Care | Payer: Medicare Other | Source: Ambulatory Visit | Attending: Emergency Medicine | Admitting: Emergency Medicine

## 2013-11-22 DIAGNOSIS — R6889 Other general symptoms and signs: Secondary | ICD-10-CM

## 2013-11-22 DIAGNOSIS — N281 Cyst of kidney, acquired: Secondary | ICD-10-CM

## 2013-12-06 ENCOUNTER — Other Ambulatory Visit: Payer: Self-pay | Admitting: Emergency Medicine

## 2013-12-14 ENCOUNTER — Encounter: Payer: Self-pay | Admitting: Emergency Medicine

## 2013-12-15 ENCOUNTER — Ambulatory Visit (INDEPENDENT_AMBULATORY_CARE_PROVIDER_SITE_OTHER): Payer: Medicare Other | Admitting: *Deleted

## 2013-12-15 DIAGNOSIS — R6889 Other general symptoms and signs: Secondary | ICD-10-CM

## 2013-12-15 LAB — HEPATIC FUNCTION PANEL
ALBUMIN: 4 g/dL (ref 3.5–5.2)
ALT: 59 U/L — AB (ref 0–53)
AST: 47 U/L — ABNORMAL HIGH (ref 0–37)
Alkaline Phosphatase: 54 U/L (ref 39–117)
BILIRUBIN DIRECT: 0.2 mg/dL (ref 0.0–0.3)
Indirect Bilirubin: 1 mg/dL (ref 0.2–1.2)
Total Bilirubin: 1.2 mg/dL (ref 0.2–1.2)
Total Protein: 6.5 g/dL (ref 6.0–8.3)

## 2013-12-15 NOTE — Progress Notes (Signed)
Patient ID: Melvin Shelton, male   DOB: 07/28/1945, 68 y.o.   MRN: [id number] Patient returns for labs and BP check.  BP was 136/82 and weight was 200.2 lb. He states he has reduced the amount of food he is eating and making better choices.  He has lost 13 # since 09/2013 visit.

## 2013-12-16 LAB — MICROALBUMIN / CREATININE URINE RATIO
CREATININE, URINE: 84.8 mg/dL
MICROALB UR: 1.99 mg/dL — AB (ref 0.00–1.89)
Microalb Creat Ratio: 23.5 mg/g (ref 0.0–30.0)

## 2014-01-04 ENCOUNTER — Other Ambulatory Visit: Payer: Self-pay | Admitting: Physician Assistant

## 2014-01-13 ENCOUNTER — Other Ambulatory Visit: Payer: Self-pay | Admitting: Emergency Medicine

## 2014-02-24 ENCOUNTER — Encounter: Payer: Self-pay | Admitting: Emergency Medicine

## 2014-02-24 ENCOUNTER — Ambulatory Visit (INDEPENDENT_AMBULATORY_CARE_PROVIDER_SITE_OTHER): Payer: Medicare Other

## 2014-02-24 VITALS — BP 130/84 | HR 80 | Temp 97.9°F | Resp 18 | Ht 70.0 in | Wt 201.0 lb

## 2014-02-24 DIAGNOSIS — I10 Essential (primary) hypertension: Secondary | ICD-10-CM

## 2014-02-24 NOTE — Progress Notes (Signed)
   Subjective:    Patient ID: Melvin Shelton, male    DOB: Aug 11, 1945, 68 y.o.   MRN: [id number]  HPI Comments: Rescheduled- Nurse visit only with medication reviewed    Medication List       This list is accurate as of: 02/24/14  2:46 PM.  Always use your most recent med list.               ALPRAZolam 0.5 MG tablet  Commonly known as:  XANAX  TAKE 1 TABLET BY MOUTH THREE TIMES DAILY AS NEEDED FOR ANXIETY     amLODipine 5 MG tablet  Commonly known as:  NORVASC  TAKE 1 TABLET BY MOUTH EVERY DAY     aspirin 81 MG tablet  Take 81 mg by mouth daily.     atorvastatin 10 MG tablet  Commonly known as:  LIPITOR  Take 10 mg by mouth daily.     CALCIUM 600 PO  Take by mouth daily.     CLOBETASOL PROPIONATE E 0.05 % emollient cream  Generic drug:  Clobetasol Prop Emollient Base  APPLY TO AFFECTED AREA AS DIRECTED     dexamethasone 0.5 MG tablet  Commonly known as:  DECADRON  Take 0.5 mg by mouth as needed.     dexamethasone 0.75 MG tablet  Commonly known as:  DECADRON  TAKE 1/2 TO 1 TABLET BY MOUTH EVERY DAY     Fish Oil 1200 MG Caps  Take by mouth 3 (three) times daily.     FORTESTA 10 MG/ACT (2%) Gel  Generic drug:  Testosterone  Place onto the skin as directed.     Magnesium 500 MG Caps  Take 500 mg by mouth daily.     metFORMIN 500 MG 24 hr tablet  Commonly known as:  GLUCOPHAGE XR  1 tab BID     multivitamin tablet  Take 1 tablet by mouth daily.     penicillin v potassium 500 MG tablet  Commonly known as:  VEETID  Take 500 mg by mouth 2 (two) times daily.     quinapril 40 MG tablet  Commonly known as:  ACCUPRIL  TAKE 1 TABLET BY MOUTH EVERY DAY FOR HIGH BLOOD PRESSURE          Review of Systems  Constitutional: Negative for activity change.   BP 130/84  Pulse 80  Temp(Src) 97.9 F (36.6 C)  Resp 18  Ht 5' 10"  (1.778 m)  Wt 201 lb (91.173 kg)  BMI 28.84 kg/m2     Objective:   Physical Exam  Nursing note and vitals  reviewed. Constitutional: He appears well-developed.  Psychiatric: Judgment normal.          Assessment & Plan:  Scheduled in correctly will change to [place] and reschedule as CPE with [facility] due to Medicare rules. NO EXAM PERFORMED only VITAL. EKG/ Aorta SCAN will be reviewed at CPE

## 2014-03-07 ENCOUNTER — Encounter: Payer: Self-pay | Admitting: Physician Assistant

## 2014-03-16 ENCOUNTER — Ambulatory Visit (INDEPENDENT_AMBULATORY_CARE_PROVIDER_SITE_OTHER): Payer: Medicare Other | Admitting: Internal Medicine

## 2014-03-16 ENCOUNTER — Encounter: Payer: Self-pay | Admitting: Internal Medicine

## 2014-03-16 VITALS — BP 148/84 | HR 64 | Temp 97.7°F | Resp 16 | Ht 68.25 in | Wt 202.2 lb

## 2014-03-16 DIAGNOSIS — Z79899 Other long term (current) drug therapy: Secondary | ICD-10-CM | POA: Insufficient documentation

## 2014-03-16 DIAGNOSIS — Z23 Encounter for immunization: Secondary | ICD-10-CM

## 2014-03-16 DIAGNOSIS — Z125 Encounter for screening for malignant neoplasm of prostate: Secondary | ICD-10-CM

## 2014-03-16 DIAGNOSIS — E559 Vitamin D deficiency, unspecified: Secondary | ICD-10-CM

## 2014-03-16 DIAGNOSIS — I10 Essential (primary) hypertension: Secondary | ICD-10-CM

## 2014-03-16 DIAGNOSIS — E785 Hyperlipidemia, unspecified: Secondary | ICD-10-CM

## 2014-03-16 DIAGNOSIS — R945 Abnormal results of liver function studies: Secondary | ICD-10-CM

## 2014-03-16 DIAGNOSIS — Z1212 Encounter for screening for malignant neoplasm of rectum: Secondary | ICD-10-CM

## 2014-03-16 DIAGNOSIS — R6889 Other general symptoms and signs: Secondary | ICD-10-CM

## 2014-03-16 DIAGNOSIS — R7989 Other specified abnormal findings of blood chemistry: Secondary | ICD-10-CM

## 2014-03-16 DIAGNOSIS — Z0001 Encounter for general adult medical examination with abnormal findings: Secondary | ICD-10-CM

## 2014-03-16 DIAGNOSIS — E119 Type 2 diabetes mellitus without complications: Secondary | ICD-10-CM

## 2014-03-16 DIAGNOSIS — E291 Testicular hypofunction: Secondary | ICD-10-CM

## 2014-03-16 DIAGNOSIS — Z113 Encounter for screening for infections with a predominantly sexual mode of transmission: Secondary | ICD-10-CM

## 2014-03-16 DIAGNOSIS — Z1331 Encounter for screening for depression: Secondary | ICD-10-CM

## 2014-03-16 DIAGNOSIS — Z9181 History of falling: Secondary | ICD-10-CM

## 2014-03-16 DIAGNOSIS — E349 Endocrine disorder, unspecified: Secondary | ICD-10-CM

## 2014-03-16 LAB — HEMOGLOBIN A1C
Hgb A1c MFr Bld: 5.7 % — ABNORMAL HIGH (ref <5.7)
MEAN PLASMA GLUCOSE: 117 mg/dL — AB (ref <117)

## 2014-03-16 LAB — CBC WITH DIFFERENTIAL/PLATELET
Basophils Absolute: 0 10*3/uL (ref 0.0–0.1)
Basophils Relative: 0 % (ref 0–1)
Eosinophils Absolute: 0.1 10*3/uL (ref 0.0–0.7)
Eosinophils Relative: 1 % (ref 0–5)
HEMATOCRIT: 46.7 % (ref 39.0–52.0)
HEMOGLOBIN: 17 g/dL (ref 13.0–17.0)
LYMPHS PCT: 22 % (ref 12–46)
Lymphs Abs: 1.9 10*3/uL (ref 0.7–4.0)
MCH: 32.5 pg (ref 26.0–34.0)
MCHC: 36.4 g/dL — ABNORMAL HIGH (ref 30.0–36.0)
MCV: 89.3 fL (ref 78.0–100.0)
MONOS PCT: 8 % (ref 3–12)
Monocytes Absolute: 0.7 10*3/uL (ref 0.1–1.0)
NEUTROS ABS: 5.9 10*3/uL (ref 1.7–7.7)
Neutrophils Relative %: 69 % (ref 43–77)
Platelets: 293 10*3/uL (ref 150–400)
RBC: 5.23 MIL/uL (ref 4.22–5.81)
RDW: 13.9 % (ref 11.5–15.5)
WBC: 8.5 10*3/uL (ref 4.0–10.5)

## 2014-03-16 MED ORDER — VITAMIN D3 125 MCG (5000 UT) PO TABS: ORAL_TABLET | ORAL | Status: DC

## 2014-03-16 NOTE — Progress Notes (Signed)
Patient ID: CLAYDEN WITHEM, male   DOB: Nov 08, 1945, 68 y.o.   MRN: [id number]  MEDICARE ANNUAL WELLNESS VISIT & PREVENTATIVE VISIT & CPE  Assessment:   1. Encounter for general adult medical examination with abnormal findings   2. Essential hypertension  - Microalbumin / creatinine urine ratio - EKG 12-Lead - [place], RETROPERITNL ABD,  LTD  3. Hyperlipidemia   4. Type 2 diabetes mellitus without complication  - HM DIABETES FOOT EXAM - LOW EXTREMITY NEUR EXAM DOCUM - Hemoglobin A1c - Insulin, fasting  5. Vitamin D deficiency  - Vit D  25 hydroxy (rtn osteoporosis monitoring)  6. Testosterone deficiency  - Testosterone  7. Medication management  - Urine Microscopic - CBC with Differential - BASIC METABOLIC PANEL WITH GFR - Hepatic function panel - Magnesium - Lipid panel - TSH  8. Screening for rectal cancer  - POC Hemoccult Bld/Stl (3-Cd Home Screen); Future  9. Screening for prostate cancer  - PSA  10. Abnormal LFTs  - Hepatitis A antibody, total - Hepatitis B core antibody, total - Hepatitis B e antibody - Hepatitis B surface antibody - Hepatitis C antibody  11. Screening for venereal disease (VD)  - HIV antibody - RPR  12. Need for prophylactic vaccination with tetanus-diphtheria (TD)  - DT Vaccine greater than 7yo IM  Plan:   During the course of the visit the patient was educated and counseled about appropriate screening and preventive services including:    Pneumococcal vaccine   Influenza vaccine  Td vaccine  Screening electrocardiogram  Bone densitometry screening  Colorectal cancer screening  Diabetes screening  Glaucoma screening  Nutrition counseling   Advanced directives: requested  Screening recommendations, referrals: Vaccinations: DT vaccine 03/16/14 Influenza vaccine declined Pneumococcal vaccine 2005 Prevnar vaccine not indicated Shingles vaccine declined Hep B vaccine not indicated  Nutrition assessed  and recommended  Colonoscopy 02/13/2010 Recommended yearly ophthalmology/optometry visit for glaucoma screening and checkup Recommended yearly dental visit for hygiene and checkup Advanced directives - No  Conditions/risks identified: BMI: Discussed weight loss, diet, and increase physical activity.  Increase physical activity: AHA recommends 150 minutes of physical activity a week.  Medications reviewed Diabetes is not at goal, ACE/ARB therapy: No. Urinary Incontinence is not an issue: discussed non pharmacology and pharmacology options.  Fall risk: low- discussed PT, home fall assessment, medications.    Subjective:  AKAASH VANDEWATER is a 68 y.o. MWM who presents for Medicare Annual Wellness& Preventative  Visit and complete physical.  Date of last medicare wellness visit is unknown.  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 He is followed by Dr Lyman Speller at [facility]. Patient had previoully been on a DEMARD but has stopped since he can't afford on M/C.  He has had elevated blood pressure since 2013.  His blood pressure has been controlled at home, today their BP is BP: 148/84 mmHg  He does not workout. He denies chest pain, shortness of breath, dizziness.  He is on cholesterol medication and denies myalgias. His cholesterol is at goal. The cholesterol last visit was:   Lab Results  Component Value Date   CHOL 193 09/22/2013   HDL 42 09/22/2013   LDLCALC 101* 09/22/2013   TRIG 250* 09/22/2013   CHOLHDL 4.6 09/22/2013   He has had diabetes for 10 years since 2005. He has been working on diet and exercise for T2DM which he has attempted tocontrol with diet and denies foot ulcerations, hyperglycemia,  paresthesia of the feet, polydipsia, polyuria and visual disturbances. Last A1C in the office was:  Lab Results  Component Value Date   HGBA1C 6.6* 09/22/2013   Patient is on Vitamin D supplement.  Last Vit D was 87  in Sept 2014.     Names of Other Physician/Practitioners you currently use: 1. [facility] here for primary care 2. [facility] , eye doctor, last visit 2013 3. Dr Elvina Mattes, dentist, last visit 2013  Patient Care Team: Unk Pinto, MD as PCP - General (Internal Medicine) Inda Castle, MD as Consulting Physician (Gastroenterology) Orville Govern, MD as Consulting Physician (Dermatology)  Medication Review: Medication Sig  . ALPRAZolam  0.5 MG tablet TAKE 1 TAB  THREE TIMES DAILY AS NEEDED  . amLODipine  5 MG tablet TAKE 1 TAB EVERY DAY  . aspirin 81 MG tablet Take 81 mg by mouth daily.  Marland Kitchen atorvastatin 10 MG tablet Take 10 mg by mouth daily.  . Calcium Carbonate  600 mg Take by mouth daily.  Marland Kitchen CLOBETASOL PROPIONATE E 0.05 % crm APPLY TO AFFECTED AREA AS DIRECTED  . Magnesium 500 MG CAPS Take 500 mg by mouth daily.  . Multiple Vitamin Take 1 tablet by mouth daily.  Marland Kitchen FISH OIL 1200 MG  Take by mouth 3 (three) times daily.  . penicillin v potassium (VEETID) 500 MG tablet Take 500 mg by mouth 2 (two) times daily.   Current Problems (verified) Patient Active Problem List   Diagnosis Date Noted  . T2_NIDDM 03/16/2014  . Medication management 03/16/2014  . Vitamin D deficiency 03/16/2014  . Abnormal LFTs 03/16/2014  . Hypertension   . Psoriatic arthritis   . Hyperlipidemia   . Testosterone deficiency   . Osteoporosis   . Elevated LFTs   . Hx of colonic polyp     Screening Tests Health Maintenance  Topic Date Due  . Foot Exam  10/09/1957  . Ophthalmology Exam  10/09/1957  . Tetanus/tdap  10/10/1966  . Colonoscopy  10/09/1997  . Zostavax  10/10/2007  . Influenza Vaccine  01/01/2014  . Pneumococcal Polysaccharide Vaccine Age 62 And Over  09/23/2014 (Originally 10/09/2012)  . Hemoglobin A1c  03/24/2014  . Urine Microalbumin  12/16/2014   Preventative care: Last colonoscopy: 9/13 2011  Prior vaccinations: TD :  03/16/2014  Influenza: declines  Pneumococcal: 2005 Shingles/Zostavax: declines  History reviewed: allergies, current medications, past family history, past medical history, past social history, past surgical history and problem list  Risk Factors: Tobacco History  Substance Use Topics  . Smoking status: Never Smoker   . Smokeless tobacco: Not on file  . Alcohol Use: Yes     Comment: ocassional   He does not smoke.  Patient is not a former smoker, but has 2sd hand exposure. Are there smokers in your home (other than you)?  No  Alcohol Current alcohol use: none, rare  Caffeine Current caffeine use: coffee 1-3 /day  Exercise Current exercise: walking  Nutrition/Diet Current diet: in general, a "healthy" diet    Cardiac risk factors: advanced age (older than 93 for men, 81 for women), diabetes mellitus, dyslipidemia, hypertension, male gender, sedentary lifestyle and smoking/ tobacco exposure.  Depression Screen (Note: if answer to either of the following is "Yes", a more complete depression screening is indicated)   Q1: Over the past two weeks, have you felt down, depressed or hopeless? No  Q2: Over the past two weeks, have you felt little interest or  pleasure in doing things? No  Have you lost interest or pleasure in daily life? No  Do you often feel hopeless? No  Do you cry easily over simple problems? No  Activities of Daily Living In your present state of health, do you have any difficulty performing the following activities?:  Driving? No Managing money?  No Feeding yourself? No Getting from bed to chair? No Climbing a flight of stairs? No Preparing food and eating?: No Bathing or showering? No Getting dressed: No Getting to the toilet? No Using the toilet:No Moving around from place to place: No In the past year have you fallen or had a near fall?:No   Are you sexually active?  Yes  Do you have more than one partner?  No  Vision  Difficulties: No  Hearing Difficulties: No Do you often ask people to speak up or repeat themselves? No Do you experience ringing or noises in your ears? No Do you have difficulty understanding soft or whispered voices? No  Cognition  Do you feel that you have a problem with memory?No  Do you often misplace items? No  Do you feel safe at home?  Yes  Advanced directives Does patient have a [place]? No Does patient have a Living Will? No Patient given copy of Advanced Directives & HC POA today.  Objective:     Blood pressure 148/84, pulse 64, temperature 97.7 F (36.5 C), temperature source Temporal, resp. rate 16, height 5' 8.25" (1.734 m), weight 202 lb 3.2 oz (91.717 kg). Body mass index is 30.5 kg/(m^2).  General appearance: alert, no distress, WD/WN, male Cognitive Testing  Alert? Yes  Normal Appearance? Yes  Oriented to person? Yes  Place? Yes   Time? Yes  Recall of three objects?  Yes  Can perform simple calculations? Yes  Displays appropriate judgment? Yes  Can read the correct time from a watch/clock? Yes  HEENT: normocephalic, sclerae anicteric, TMs pearly, nares patent, no discharge or erythema, pharynx normal Oral cavity: MMM, no lesions Neck: supple, no lymphadenopathy, no thyromegaly, no masses Heart: RRR, normal S1, S2, no murmurs Lungs: CTA bilaterally, no wheezes, rhonchi, or rales Abdomen: +bs, soft, non tender, non distended, no masses, no hepatomegaly, no splenomegaly GU: DRE finds prostate smooth & Nl for age . Hemoccult Neg. Musculoskeletal: nontender, no swelling, no obvious deformity Extremities: no edema, no cyanosis, no clubbing Pulses: 2+ symmetric, upper and lower extremities, normal cap refill Neurological: alert, oriented x 3, CN2-12 intact, strength normal upper extremities and lower extremities, sensation normal throughout, DTRs 2+ throughout, no cerebellar signs, gait normal Psychiatric: normal affect, behavior normal,  pleasant   Medicare Attestation I have personally reviewed: The patient's medical and social history Their use of alcohol, tobacco or illicit drugs Their current medications and supplements The patient's functional ability including ADLs,fall risks, home safety risks, cognitive, and hearing and visual impairment Diet and physical activities Evidence for depression or mood disorders  The patient's weight, height, BMI, and visual acuity have been recorded in the chart.  I have made referrals, counseling, and provided education to the patient based on review of the above and I have provided the patient with a written personalized care plan for preventive services.    Annis Lagoy DAVID, MD   03/16/2014

## 2014-03-16 NOTE — Patient Instructions (Addendum)
 Recommend the book "The END of DIETING" by Dr Joel Furman   and the book "The END of DIABETES " by Dr Joel Fuhrman  At [employer] - get book & Audio CD's      Being diabetic has a  300% increased risk for heart attack, stroke, cancer, and alzheimer- type vascular dementia. It is very important that you work harder with diet by avoiding all foods that are white except chicken & fish. Avoid white rice (brown & wild rice is OK), white potatoes (sweetpotatoes in moderation is OK), White bread or wheat bread or anything made out of white flour like bagels, donuts, rolls, buns, biscuits, cakes, pastries, cookies, pizza crust, and pasta (made from white flour & egg whites) - vegetarian pasta or spinach or wheat pasta is OK. Multigrain breads like Arnold's or [employer], or multigrain sandwich thins or flatbreads.  Diet, exercise and weight loss can reverse and cure diabetes in the early stages.  Diet, exercise and weight loss is very important in the control and prevention of complications of diabetes which affects every system in your body, ie. Brain - dementia/stroke, eyes - glaucoma/blindness, heart - heart attack/heart failure, kidneys - dialysis, stomach - gastric paralysis, intestines - malabsorption, nerves - severe painful neuritis, circulation - gangrene & loss of a leg(s), and finally cancer and Alzheimers.    I recommend avoid fried & greasy foods,  sweets/candy, white rice (brown or wild rice or Quinoa is OK), white potatoes (sweet potatoes are OK) - anything made from white flour - bagels, doughnuts, rolls, buns, biscuits,white and wheat breads, pizza crust and traditional pasta made of white flour & egg white(vegetarian pasta or spinach or wheat pasta is OK).  Multi-grain bread is OK - like multi-grain flat bread or sandwich thins. Avoid alcohol in excess. Exercise is also important.    Eat all the vegetables you want - avoid meat, especially red meat and dairy - especially cheese.  Cheese  is the most concentrated form of trans-fats which is the worst thing to clog up our arteries. Veggie cheese is OK which can be found in the fresh produce section at [employer] or [employer] or [employer]  Preventive Care for Adults A healthy lifestyle and preventive care can promote health and wellness. Preventive health guidelines for men include the following key practices:  A routine yearly physical is a good way to check with your health care provider about your health and preventative screening. It is a chance to share any concerns and updates on your health and to receive a thorough exam.  Visit your dentist for a routine exam and preventative care every 6 months. Brush your teeth twice a day and floss once a day. Good oral hygiene prevents tooth decay and gum disease.  The frequency of eye exams is based on your age, health, family medical history, use of contact lenses, and other factors. Follow your health care provider's recommendations for frequency of eye exams.  Eat a healthy diet. Foods such as vegetables, fruits, whole grains, low-fat dairy products, and lean protein foods contain the nutrients you need without too many calories. Decrease your intake of foods high in solid fats, added sugars, and salt. Eat the right amount of calories for you.Get information about a proper diet from your health care provider, if necessary.  Regular physical exercise is one of the most important things you can do for your health. Most adults should get at least 150 minutes of moderate-intensity exercise (any activity that   increases your heart rate and causes you to sweat) each week. In addition, most adults need muscle-strengthening exercises on 2 or more days a week.  Maintain a healthy weight. The body mass index (BMI) is a screening tool to identify possible weight problems. It provides an estimate of body fat based on height and weight. Your health care provider can find your BMI and can help you  achieve or maintain a healthy weight.For adults 20 years and older:  A BMI below 18.5 is considered underweight.  A BMI of 18.5 to 24.9 is normal.  A BMI of 25 to 29.9 is considered overweight.  A BMI of 30 and above is considered obese.  Maintain normal blood lipids and cholesterol levels by exercising and minimizing your intake of saturated fat. Eat a balanced diet with plenty of fruit and vegetables. Blood tests for lipids and cholesterol should begin at age 20 and be repeated every 5 years. If your lipid or cholesterol levels are high, you are over 50, or you are at high risk for heart disease, you may need your cholesterol levels checked more frequently.Ongoing high lipid and cholesterol levels should be treated with medicines if diet and exercise are not working.  If you smoke, find out from your health care provider how to quit. If you do not use tobacco, do not start.  Lung cancer screening is recommended for adults aged 55-80 years who are at high risk for developing lung cancer because of a history of smoking. A yearly low-dose CT scan of the lungs is recommended for people who have at least a 30-pack-year history of smoking and are a current smoker or have quit within the past 15 years. A pack year of smoking is smoking an average of 1 pack of cigarettes a day for 1 year (for example: 1 pack a day for 30 years or 2 packs a day for 15 years). Yearly screening should continue until the smoker has stopped smoking for at least 15 years. Yearly screening should be stopped for people who develop a health problem that would prevent them from having lung cancer treatment.  If you choose to drink alcohol, do not have more than 2 drinks per day. One drink is considered to be 12 ounces (355 mL) of beer, 5 ounces (148 mL) of wine, or 1.5 ounces (44 mL) of liquor.  Avoid use of street drugs. Do not share needles with anyone. Ask for help if you need support or instructions about stopping the use of  drugs.  High blood pressure causes heart disease and increases the risk of stroke. Your blood pressure should be checked at least every 1-2 years. Ongoing high blood pressure should be treated with medicines, if weight loss and exercise are not effective.  If you are 45-79 years old, ask your health care provider if you should take aspirin to prevent heart disease.  Diabetes screening involves taking a blood sample to check your fasting blood sugar level. This should be done once every 3 years, after age 45, if you are within normal weight and without risk factors for diabetes. Testing should be considered at a younger age or be carried out more frequently if you are overweight and have at least 1 risk factor for diabetes.  Colorectal cancer can be detected and often prevented. Most routine colorectal cancer screening begins at the age of 50 and continues through age 75. However, your health care provider may recommend screening at an earlier age if you have risk   risk factors for colon cancer. On a yearly basis, your health care provider may provide home test kits to check for hidden blood in the stool. Use of a small camera at the end of a tube to directly examine the colon (sigmoidoscopy or colonoscopy) can detect the earliest forms of colorectal cancer. Talk to your health care provider about this at age 76, when routine screening begins. Direct exam of the colon should be repeated every 5-10 years through age 97, unless early forms of precancerous polyps or small growths are found.  People who are at an increased risk for hepatitis B should be screened for this virus. You are considered at high risk for hepatitis B if:  You were born in a country where hepatitis B occurs often. Talk with your health care provider about which countries are considered high risk.  Your parents were born in a high-risk country and you have not received a shot to protect against hepatitis B (hepatitis B  vaccine).  You have HIV or AIDS.  You use needles to inject street drugs.  You live with, or have sex with, someone who has hepatitis B.  You are a man who has sex with other men (MSM).  You get hemodialysis treatment.  You take certain medicines for conditions such as cancer, organ transplantation, and autoimmune conditions.  Hepatitis C blood testing is recommended for all people born from [birth year] through 1965 and any individual with known risks for hepatitis C.  Practice safe sex. Use condoms and avoid high-risk sexual practices to reduce the spread of sexually transmitted infections (STIs). STIs include gonorrhea, chlamydia, syphilis, trichomonas, herpes, HPV, and human immunodeficiency virus (HIV). Herpes, HIV, and HPV are viral illnesses that have no cure. They can result in disability, cancer, and death.  If you are at risk of being infected with HIV, it is recommended that you take a prescription medicine daily to prevent HIV infection. This is called preexposure prophylaxis (PrEP). You are considered at risk if:  You are a man who has sex with other men (MSM) and have other risk factors.  You are a heterosexual man, are sexually active, and are at increased risk for HIV infection.  You take drugs by injection.  You are sexually active with a partner who has HIV.  Talk with your health care provider about whether you are at high risk of being infected with HIV. If you choose to begin PrEP, you should first be tested for HIV. You should then be tested every 3 months for as long as you are taking PrEP.  Screening for abdominal aortic aneurysm (AAA) and surgical repair of large AAAs by ultrasound are recommended for men ages 51 to 52 years who are current or former smokers.  Healthy men should no longer receive prostate-specific antigen (PSA) blood tests as part of routine cancer screening. Talk with your health care provider about prostate cancer screening.  Testicular cancer  screening is not recommended for adult males who have no symptoms. Screening includes self-exam, a health care provider exam, and other screening tests. Consult with your health care provider about any symptoms you have or any concerns you have about testicular cancer.  Use sunscreen. Apply sunscreen liberally and repeatedly throughout the day. You should seek shade when your shadow is shorter than you. Protect yourself by wearing long sleeves, pants, a wide-brimmed hat, and sunglasses year round, whenever you are outdoors.  Once a month, do a whole-body skin exam, using a mirror to look at  the skin on your back. Tell your health care provider about new moles, moles that have irregular borders, moles that are larger than a pencil eraser, or moles that have changed in shape or color.  Stay current with required vaccines (immunizations).  Influenza vaccine. All adults should be immunized every year.  Tetanus, diphtheria, and acellular pertussis (Td, Tdap) vaccine. An adult who has not previously received Tdap or who does not know his vaccine status should receive 1 dose of Tdap. This initial dose should be followed by tetanus and diphtheria toxoids (Td) booster doses every 10 years. Adults with an unknown or incomplete history of completing a 3-dose immunization series with Td-containing vaccines should begin or complete a primary immunization series including a Tdap dose. Adults should receive a Td booster every 10 years.  Varicella vaccine. An adult without evidence of immunity to varicella should receive 2 doses or a second dose if he has previously received 1 dose.  Human papillomavirus (HPV) vaccine. Males aged 77-21 years who have not received the vaccine previously should receive the 3-dose series. Males aged 22-26 years may be immunized. Immunization is recommended through the age of 89 years for any male who has sex with males and did not get any or all doses earlier. Immunization is recommended  for any person with an immunocompromised condition through the age of 84 years if he did not get any or all doses earlier. During the 3-dose series, the second dose should be obtained 4-8 weeks after the first dose. The third dose should be obtained 24 weeks after the first dose and 16 weeks after the second dose.  Zoster vaccine. One dose is recommended for adults aged 78 years or older unless certain conditions are present.  Measles, mumps, and rubella (MMR) vaccine. Adults born before [birth year] generally are considered immune to measles and mumps. Adults born in [birth year] or later should have 1 or more doses of MMR vaccine unless there is a contraindication to the vaccine or there is laboratory evidence of immunity to each of the three diseases. A routine second dose of MMR vaccine should be obtained at least 28 days after the first dose for students attending postsecondary schools, health care workers, or international travelers. People who received inactivated measles vaccine or an unknown type of measles vaccine during 1963-1967 should receive 2 doses of MMR vaccine. People who received inactivated mumps vaccine or an unknown type of mumps vaccine before 1979 and are at high risk for mumps infection should consider immunization with 2 doses of MMR vaccine. Unvaccinated health care workers born before [birth year] who lack laboratory evidence of measles, mumps, or rubella immunity or laboratory confirmation of disease should consider measles and mumps immunization with 2 doses of MMR vaccine or rubella immunization with 1 dose of MMR vaccine.  Pneumococcal 13-valent conjugate (PCV13) vaccine. When indicated, a person who is uncertain of his immunization history and has no record of immunization should receive the PCV13 vaccine. An adult aged 74 years or older who has certain medical conditions and has not been previously immunized should receive 1 dose of PCV13 vaccine. This PCV13 should be followed with a dose of  pneumococcal polysaccharide (PPSV23) vaccine. The PPSV23 vaccine dose should be obtained at least 8 weeks after the dose of PCV13 vaccine. An adult aged 52 years or older who has certain medical conditions and previously received 1 or more doses of PPSV23 vaccine should receive 1 dose of PCV13. The PCV13 vaccine dose should be obtained 1 or  more years after the last PPSV23 vaccine dose.  Pneumococcal polysaccharide (PPSV23) vaccine. When PCV13 is also indicated, PCV13 should be obtained first. All adults aged 56 years and older should be immunized. An adult younger than age 40 years who has certain medical conditions should be immunized. Any person who resides in a nursing home or long-term care facility should be immunized. An adult smoker should be immunized. People with an immunocompromised condition and certain other conditions should receive both PCV13 and PPSV23 vaccines. People with human immunodeficiency virus (HIV) infection should be immunized as soon as possible after diagnosis. Immunization during chemotherapy or radiation therapy should be avoided. Routine use of PPSV23 vaccine is not recommended for American Indians, [place] Natives, or people younger than 65 years unless there are medical conditions that require PPSV23 vaccine. When indicated, people who have unknown immunization and have no record of immunization should receive PPSV23 vaccine. One-time revaccination 5 years after the first dose of PPSV23 is recommended for people aged 19-64 years who have chronic kidney failure, nephrotic syndrome, asplenia, or immunocompromised conditions. People who received 1-2 doses of PPSV23 before age 58 years should receive another dose of PPSV23 vaccine at age 22 years or later if at least 5 years have passed since the previous dose. Doses of PPSV23 are not needed for people immunized with PPSV23 at or after age 47 years.  Meningococcal vaccine. Adults with asplenia or persistent complement component  deficiencies should receive 2 doses of quadrivalent meningococcal conjugate (MenACWY-D) vaccine. The doses should be obtained at least 2 months apart. Microbiologists working with certain meningococcal bacteria, [employer] recruits, people at risk during an outbreak, and people who travel to or live in countries with a high rate of meningitis should be immunized. A first-year college student up through age 76 years who is living in a residence hall should receive a dose if he did not receive a dose on or after his 16th birthday. Adults who have certain high-risk conditions should receive one or more doses of vaccine.  Hepatitis A vaccine. Adults who wish to be protected from this disease, have certain high-risk conditions, work with hepatitis A-infected animals, work in hepatitis A research labs, or travel to or work in countries with a high rate of hepatitis A should be immunized. Adults who were previously unvaccinated and who anticipate close contact with an international adoptee during the first 60 days after arrival in the [place] from a country with a high rate of hepatitis A should be immunized.  Hepatitis B vaccine. Adults should be immunized if they wish to be protected from this disease, have certain high-risk conditions, may be exposed to blood or other infectious body fluids, are household contacts or sex partners of hepatitis B positive people, are clients or workers in certain care facilities, or travel to or work in countries with a high rate of hepatitis B.  Haemophilus influenzae type b (Hib) vaccine. A previously unvaccinated person with asplenia or sickle cell disease or having a scheduled splenectomy should receive 1 dose of Hib vaccine. Regardless of previous immunization, a recipient of a hematopoietic stem cell transplant should receive a 3-dose series 6-12 months after his successful transplant. Hib vaccine is not recommended for adults with HIV infection. Preventive Service /  Frequency   Ages 79 and over  Blood pressure check.** / Every 1 to 2 years.  Lipid and cholesterol check.**/ Every 5 years beginning at age 26.  Lung cancer screening. / Every year if you are aged 25-80  years and have a 30-pack-year history of smoking and currently smoke or have quit within the past 15 years. Yearly screening is stopped once you have quit smoking for at least 15 years or develop a health problem that would prevent you from having lung cancer treatment.  Fecal occult blood test (FOBT) of stool. / Every year beginning at age 64 and continuing until age 41. You may not have to do this test if you get a colonoscopy every 10 years.  Flexible sigmoidoscopy** or colonoscopy.** / Every 5 years for a flexible sigmoidoscopy or every 10 years for a colonoscopy beginning at age 30 and continuing until age 46.  Hepatitis C blood test.** / For all people born from [birth year] through 1963 and any individual with known risks for hepatitis C.  Abdominal aortic aneurysm (AAA) screening for patients with hypertension or who are current or former smokers.  Skin self-exam. / Monthly.  Influenza vaccine. / Every year.  Tetanus, diphtheria, and acellular pertussis (Tdap/Td) vaccine.** / 1 dose of Td every 10 years.  Varicella vaccine.** / Consult your health care provider.  Zoster vaccine.** / 1 dose for adults aged 58 years or older.  Pneumococcal 13-valent conjugate (PCV13) vaccine.** / Consult your health care provider.  Pneumococcal polysaccharide (PPSV23) vaccine.** / 1 dose for all adults aged 25 years and older.  Meningococcal vaccine.** / Consult your health care provider.  Hepatitis A vaccine.** / Consult your health care provider.  Hepatitis B vaccine.** / Consult your health care provider.

## 2014-03-17 ENCOUNTER — Other Ambulatory Visit: Payer: Self-pay | Admitting: Physician Assistant

## 2014-03-17 LAB — HEPATIC FUNCTION PANEL
ALK PHOS: 55 U/L (ref 39–117)
ALT: 25 U/L (ref 0–53)
AST: 18 U/L (ref 0–37)
Albumin: 4.2 g/dL (ref 3.5–5.2)
Bilirubin, Direct: 0.2 mg/dL (ref 0.0–0.3)
Indirect Bilirubin: 0.6 mg/dL (ref 0.2–1.2)
TOTAL PROTEIN: 7 g/dL (ref 6.0–8.3)
Total Bilirubin: 0.8 mg/dL (ref 0.2–1.2)

## 2014-03-17 LAB — HIV ANTIBODY (ROUTINE TESTING W REFLEX): HIV 1&2 Ab, 4th Generation: NONREACTIVE

## 2014-03-17 LAB — URINALYSIS, MICROSCOPIC ONLY
Bacteria, UA: NONE SEEN
CASTS: NONE SEEN
Crystals: NONE SEEN
Squamous Epithelial / LPF: NONE SEEN

## 2014-03-17 LAB — MICROALBUMIN / CREATININE URINE RATIO
CREATININE, URINE: 115.3 mg/dL
MICROALB UR: 7.2 mg/dL — AB (ref <2.0)
Microalb Creat Ratio: 62.4 mg/g — ABNORMAL HIGH (ref 0.0–30.0)

## 2014-03-17 LAB — BASIC METABOLIC PANEL WITH GFR
BUN: 13 mg/dL (ref 6–23)
CHLORIDE: 105 meq/L (ref 96–112)
CO2: 26 mEq/L (ref 19–32)
Calcium: 9.6 mg/dL (ref 8.4–10.5)
Creat: 0.78 mg/dL (ref 0.50–1.35)
GFR, Est African American: >89 mL/min
GLUCOSE: 106 mg/dL — AB (ref 70–99)
POTASSIUM: 4.2 meq/L (ref 3.5–5.3)
Sodium: 143 mEq/L (ref 135–145)

## 2014-03-17 LAB — RPR

## 2014-03-17 LAB — HEPATITIS B CORE ANTIBODY, TOTAL: HEP B C TOTAL AB: NONREACTIVE

## 2014-03-17 LAB — LIPID PANEL
Cholesterol: 203 mg/dL — ABNORMAL HIGH (ref 0–200)
HDL: 53 mg/dL (ref >39)
LDL CALC: 129 mg/dL — AB (ref 0–99)
Total CHOL/HDL Ratio: 3.8 Ratio
Triglycerides: 106 mg/dL (ref <150)
VLDL: 21 mg/dL (ref 0–40)

## 2014-03-17 LAB — MAGNESIUM: Magnesium: 2.4 mg/dL (ref 1.5–2.5)

## 2014-03-17 LAB — TESTOSTERONE: Testosterone: 246 ng/dL — ABNORMAL LOW (ref 300–890)

## 2014-03-17 LAB — INSULIN, FASTING: Insulin fasting, serum: 28.8 u[IU]/mL — ABNORMAL HIGH (ref 2.0–19.6)

## 2014-03-17 LAB — HEPATITIS A ANTIBODY, TOTAL: HEP A TOTAL AB: NONREACTIVE

## 2014-03-17 LAB — VITAMIN D 25 HYDROXY (VIT D DEFICIENCY, FRACTURES): Vit D, 25-Hydroxy: 118 ng/mL — ABNORMAL HIGH (ref 30–89)

## 2014-03-17 LAB — TSH: TSH: 0.703 u[IU]/mL (ref 0.350–4.500)

## 2014-03-17 LAB — HEPATITIS B SURFACE ANTIBODY,QUALITATIVE: HEP B S AB: NEGATIVE

## 2014-03-17 LAB — PSA: PSA: 1.86 ng/mL (ref <=4.00)

## 2014-03-17 LAB — HEPATITIS C ANTIBODY: HCV Ab: NEGATIVE

## 2014-03-18 ENCOUNTER — Other Ambulatory Visit: Payer: Self-pay

## 2014-03-18 LAB — HEPATITIS B E ANTIBODY: HEPATITIS BE ANTIBODY: NONREACTIVE

## 2014-03-21 ENCOUNTER — Other Ambulatory Visit: Payer: Self-pay | Admitting: Physician Assistant

## 2014-04-20 ENCOUNTER — Other Ambulatory Visit: Payer: Self-pay | Admitting: Physician Assistant

## 2014-04-20 ENCOUNTER — Other Ambulatory Visit: Payer: Self-pay | Admitting: Emergency Medicine

## 2014-04-20 MED ORDER — ALPRAZOLAM 0.5 MG PO TABS: 0.5000 mg | ORAL_TABLET | Freq: Three times a day (TID) | ORAL | Status: DC | PRN

## 2014-05-29 ENCOUNTER — Other Ambulatory Visit: Payer: Self-pay | Admitting: Emergency Medicine

## 2014-06-06 DIAGNOSIS — L409 Psoriasis, unspecified: Secondary | ICD-10-CM | POA: Diagnosis not present

## 2014-06-06 DIAGNOSIS — L4050 Arthropathic psoriasis, unspecified: Secondary | ICD-10-CM | POA: Diagnosis not present

## 2014-06-13 ENCOUNTER — Ambulatory Visit (INDEPENDENT_AMBULATORY_CARE_PROVIDER_SITE_OTHER): Payer: Medicare Other | Admitting: *Deleted

## 2014-06-13 DIAGNOSIS — Z79899 Other long term (current) drug therapy: Secondary | ICD-10-CM

## 2014-06-13 NOTE — Progress Notes (Signed)
Patient ID: Melvin Shelton, male   DOB: 1946-05-01, 69 y.o.   MRN: [id number] Patient presents for TB Gold lab per Dr. Lyman Speller (Dermatology).  Results will be faxed Attn: Kennyth Lose [phone].

## 2014-06-16 LAB — QUANTIFERON TB GOLD ASSAY (BLOOD)
INTERFERON GAMMA RELEASE ASSAY: NEGATIVE
Mitogen value: 0.9 IU/mL
QUANTIFERON NIL VALUE: 0.02 [IU]/mL
QUANTIFERON TB AG MINUS NIL: 0 [IU]/mL
TB Ag value: 0.02 IU/mL

## 2014-06-21 ENCOUNTER — Ambulatory Visit: Payer: Self-pay | Admitting: Physician Assistant

## 2014-06-28 ENCOUNTER — Ambulatory Visit (INDEPENDENT_AMBULATORY_CARE_PROVIDER_SITE_OTHER): Payer: Medicare Other | Admitting: Physician Assistant

## 2014-06-28 ENCOUNTER — Encounter: Payer: Self-pay | Admitting: Physician Assistant

## 2014-06-28 VITALS — BP 140/82 | HR 72 | Temp 98.0°F | Resp 16 | Ht 68.25 in | Wt 211.0 lb

## 2014-06-28 DIAGNOSIS — I10 Essential (primary) hypertension: Secondary | ICD-10-CM

## 2014-06-28 DIAGNOSIS — Z79899 Other long term (current) drug therapy: Secondary | ICD-10-CM | POA: Diagnosis not present

## 2014-06-28 DIAGNOSIS — Z860100 Personal history of colon polyps, unspecified: Secondary | ICD-10-CM

## 2014-06-28 DIAGNOSIS — E785 Hyperlipidemia, unspecified: Secondary | ICD-10-CM

## 2014-06-28 DIAGNOSIS — Z789 Other specified health status: Secondary | ICD-10-CM

## 2014-06-28 DIAGNOSIS — Z1331 Encounter for screening for depression: Secondary | ICD-10-CM

## 2014-06-28 DIAGNOSIS — E119 Type 2 diabetes mellitus without complications: Secondary | ICD-10-CM

## 2014-06-28 DIAGNOSIS — R7989 Other specified abnormal findings of blood chemistry: Secondary | ICD-10-CM

## 2014-06-28 DIAGNOSIS — E559 Vitamin D deficiency, unspecified: Secondary | ICD-10-CM | POA: Diagnosis not present

## 2014-06-28 DIAGNOSIS — E291 Testicular hypofunction: Secondary | ICD-10-CM

## 2014-06-28 DIAGNOSIS — R6889 Other general symptoms and signs: Secondary | ICD-10-CM

## 2014-06-28 DIAGNOSIS — E349 Endocrine disorder, unspecified: Secondary | ICD-10-CM

## 2014-06-28 DIAGNOSIS — E669 Obesity, unspecified: Secondary | ICD-10-CM

## 2014-06-28 DIAGNOSIS — L4050 Arthropathic psoriasis, unspecified: Secondary | ICD-10-CM

## 2014-06-28 DIAGNOSIS — R945 Abnormal results of liver function studies: Secondary | ICD-10-CM

## 2014-06-28 DIAGNOSIS — Z0001 Encounter for general adult medical examination with abnormal findings: Secondary | ICD-10-CM | POA: Diagnosis not present

## 2014-06-28 DIAGNOSIS — M810 Age-related osteoporosis without current pathological fracture: Secondary | ICD-10-CM

## 2014-06-28 NOTE — Patient Instructions (Signed)

## 2014-06-28 NOTE — Progress Notes (Signed)
MEDICARE ANNUAL WELLNESS VISIT AND FOLLOW UP Assessment:   1. Essential hypertension - continue medications, DASH diet, exercise and monitor at home. Call if greater than 130/80.  - CBC with Differential/Platelet - BASIC METABOLIC PANEL WITH GFR - Hepatic function panel - TSH  2. Hyperlipidemia -continue medications, check lipids, decrease fatty foods, increase activity.  - Lipid panel  3. Type 2 diabetes mellitus without complication - Hemoglobin A1c - HM DIABETES FOOT EXAM  4. Psoriatic arthritis Try to get on DMARD  5. Osteoporosis Check DEXA  6. Testosterone deficiency Natural ways to increase testosterone  7. Elevated LFTs Check LFTs, weight loss advised  8. Hx of colonic polyp Due 2018  9. Medication management - Magnesium  10. Vitamin D deficiency - Vit D  25 hydroxy (rtn osteoporosis monitoring)  11. Obesity Obesity with co morbidities- long discussion about weight loss, diet, and exercise   Plan:   During the course of the visit the patient was educated and counseled about appropriate screening and preventive services including:    Pneumococcal vaccine   Influenza vaccine  Td vaccine  Screening electrocardiogram  Colorectal cancer screening  Diabetes screening  Glaucoma screening  Nutrition counseling   Screening recommendations, referrals: Vaccinations: Please see documentation below and orders this visit.  Nutrition assessed and recommended  Colonoscopy up to date Recommended yearly ophthalmology/optometry visit for glaucoma screening and checkup Recommended yearly dental visit for hygiene and checkup Advanced directives - requested  Conditions/risks identified: BMI: Discussed weight loss, diet, and increase physical activity.  Increase physical activity: AHA recommends 150 minutes of physical activity a week.  Medications reviewed Diabetes is at goal, ACE/ARB therapy: No, Reason not on Ace Inhibitor/ARB therapy:   declines Urinary Incontinence is not an issue: discussed non pharmacology and pharmacology options.  Fall risk: low- discussed PT, home fall assessment, medications.    Subjective:  Melvin Shelton is a 69 y.o. male who presents for Medicare Annual Wellness Visit and 3 month follow up for HTN, hyperlipidemia, prediabetes, and vitamin D Def.  Date of last medicare wellness visit was 03/16/2014  His blood pressure has been controlled at home, he is on norvasc 5, today their BP is BP: 140/82 mmHg He does not workout reguarly but walks some He denies chest pain, shortness of breath, dizziness.  He is on cholesterol medication, lipitor 66m and denies myalgias. His cholesterol is not at goal. The cholesterol last visit was:   Lab Results  Component Value Date   CHOL 203* 03/16/2014   HDL 53 03/16/2014   LDLCALC 129* 03/16/2014   TRIG 106 03/16/2014   CHOLHDL 3.8 03/16/2014  He has cutaneous psoriasis and has psoriatic arthritis affecting his hands. He follows with Dr. 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 Plays jazz guitar.  He is on xanax for  Has history of osteoporosis last DEXA was 2010.  Has history of elevated liver function test, had normal [place], Lab Results  Component Value Date   ALT 25 03/16/2014   AST 18 03/16/2014   ALKPHOS 55 03/16/2014   BILITOT 0.8 03/16/2014  He has been working on diet and exercise for diabetes A1C 09/2013 was 6.6, he has worked very hard to lower A1C, now in preDM range, he is on bASA, he is not on ACE/ARB, and denies polyuria and visual disturbances. Last A1C in the office was:  Lab  Results  Component Value Date   HGBA1C 5.7* 03/16/2014  Patient is on Vitamin D supplement.   Lab Results  Component Value Date   VD25OH 118* 03/16/2014  BMI is Body mass index is 31.83 kg/(m^2)., he is working on diet and exercise. Wt Readings from Last 3 Encounters:   06/28/14 211 lb (95.709 kg)  03/16/14 202 lb 3.2 oz (91.717 kg)  02/24/14 201 lb (91.173 kg)   Names of Other Physician/Practitioners you currently use: 1. [facility] here for primary care 2. [facility], eye doctor, last visit 2013 3. Dr. Archie Balboa, dentist, last visit 2013 Patient Care Team: Unk Pinto, MD as PCP - General (Internal Medicine) Inda Castle, MD as Consulting Physician (Gastroenterology) Orville Govern, MD as Consulting Physician  Medication Review: Current Outpatient Prescriptions on File Prior to Visit  Medication Sig Dispense Refill  . ALPRAZolam (XANAX) 0.5 MG tablet Take 1 tablet (0.5 mg total) by mouth 3 (three) times daily as needed. 90 tablet 2  . amLODipine (NORVASC) 5 MG tablet TAKE 1 TABLET BY MOUTH EVERY DAY 90 tablet 0  . amoxicillin-clavulanate (AUGMENTIN) 875-125 MG per tablet TAKE 1 TABLET BY MOUTH TWICE DAILY 20 tablet 0  . aspirin 81 MG tablet Take 81 mg by mouth daily.    . Calcium Carbonate (CALCIUM 600 PO) Take by mouth daily.    . Cholecalciferol (VITAMIN D3) 5000 UNITS TABS t tab  2 x day    . CLOBETASOL PROPIONATE E 0.05 % emollient cream APPLY TO THE AFFECTED AREA AS DIRECTED 180 g 99  . dexamethasone (DECADRON) 0.75 MG tablet TAKE 1/2 TO 1 TABLET BY MOUTH EVERY DAY 90 tablet 1  . Magnesium 500 MG CAPS Take 500 mg by mouth daily.    . Omega-3 Fatty Acids (FISH OIL) 1200 MG CAPS Take by mouth 3 (three) times daily.    . penicillin v potassium (VEETID) 500 MG tablet Take 500 mg by mouth 2 (two) times daily. Takes 1000 mg 2 times daily     No current facility-administered medications on file prior to visit.    Current Problems (verified) Patient Active Problem List   Diagnosis Date Noted  . T2_NIDDM 03/16/2014  . Medication management 03/16/2014  . Vitamin D deficiency 03/16/2014  . Abnormal LFTs 03/16/2014  . Hypertension   . Psoriatic arthritis   . Hyperlipidemia   . Testosterone  deficiency   . Osteoporosis   . Elevated LFTs   . Hx of colonic polyp     Screening Tests Health Maintenance  Topic Date Due  . OPHTHALMOLOGY EXAM  10/09/1957  . TETANUS/TDAP  10/10/1966  . COLONOSCOPY  10/09/1997  . ZOSTAVAX  10/10/2007  . INFLUENZA VACCINE  01/01/2014  . HEMOGLOBIN A1C  09/15/2014  . FOOT EXAM  03/17/2015  . URINE MICROALBUMIN  03/17/2015  . PNEUMOCOCCAL POLYSACCHARIDE VACCINE AGE 81 AND OVER  Completed    Immunization History  Administered Date(s) Administered  . DT 03/17/2014  . Pneumococcal-Unspecified 06/04/2003    Preventative care: Last colonoscopy: 02/2010 due 2018 DEXA 2010 DUE  [place] 11/2013 gall stone, no fatty liver  Prior vaccinations: TD or Tdap: 2015  Influenza: declines  Pneumococcal: 2005 Prevnar13: declines Shingles/Zostavax: declines    Medication List       This list is accurate as of: 06/28/14  3:21 PM.  Always use your most recent med list.               ALPRAZolam 0.5 MG tablet  Commonly known as:  XANAX  Take 1 tablet (0.5 mg total) by mouth 3 (three) times daily as needed.     amLODipine 5 MG tablet  Commonly known as:  NORVASC  TAKE 1 TABLET BY MOUTH EVERY DAY     amoxicillin-clavulanate 875-125 MG per tablet  Commonly known as:  AUGMENTIN  TAKE 1 TABLET BY MOUTH TWICE DAILY     aspirin 81 MG tablet  Take 81 mg by mouth daily.     CALCIUM 600 PO  Take by mouth daily.     CLOBETASOL PROPIONATE E 0.05 % emollient cream  Generic drug:  Clobetasol Prop Emollient Base  APPLY TO THE AFFECTED AREA AS DIRECTED     dexamethasone 0.75 MG tablet  Commonly known as:  DECADRON  TAKE 1/2 TO 1 TABLET BY MOUTH EVERY DAY     Fish Oil 1200 MG Caps  Take by mouth 3 (three) times daily.     Magnesium 500 MG Caps  Take 500 mg by mouth daily.     penicillin v potassium 500 MG tablet  Commonly known as:  VEETID  Take 500 mg by mouth 2 (two) times daily. Takes 1000 mg 2 times daily     Vitamin D3 5000 UNITS Tabs  t  tab  2 x day        No past surgical history on file. Family History  Problem Relation Age of Onset  . Cancer Mother     lymphoma  . Heart disease Father   . Hypertension Father   . Diabetes Father   . Cancer Father     prostate   History reviewed: allergies, current medications, past family history, past medical history, past social history, past surgical history and problem list  Risk Factors: Tobacco History  Substance Use Topics  . Smoking status: Never Smoker   . Smokeless tobacco: Not on file  . Alcohol Use: Yes     Comment: ocassional   He does not smoke.  Patient is not a former smoker. Are there smokers in your home (other than you)?  No  Alcohol Current alcohol use: none  Caffeine Current caffeine use: coffee 1-3 /day  Exercise Current exercise: walking  Nutrition/Diet Current diet: in general, a "healthy" diet    Cardiac risk factors: advanced age (older than 52 for men, 89 for women), diabetes mellitus, dyslipidemia, family history of premature cardiovascular disease, hypertension, male gender, obesity (BMI >= 30 kg/m2), sedentary lifestyle and smoking/ tobacco exposure.  Depression Screen (Note: if answer to either of the following is "Yes", a more complete depression screening is indicated)   Q1: Over the past two weeks, have you felt down, depressed or hopeless? No  Q2: Over the past two weeks, have you felt little interest or pleasure in doing things? No  Have you lost interest or pleasure in daily life? No  Do you often feel hopeless? No  Do you cry easily over simple problems? No  Activities of Daily Living In your present state of health, do you have any difficulty performing the following activities?:  Driving? No Managing money?  No Feeding yourself? No Getting from bed to chair? No Climbing a flight of stairs? No Preparing food and eating?: No Bathing or showering? No Getting dressed: No Getting to the toilet? No Using the  toilet:No Moving around from place to place: No In the past year have you fallen or had a near fall?:No   Are you sexually active?  No  Do you  have more than one partner?  No  Vision Difficulties: No  Hearing Difficulties: No Do you often ask people to speak up or repeat themselves? No Do you experience ringing or noises in your ears? No Do you have difficulty understanding soft or whispered voices? No  Cognition  Do you feel that you have a problem with memory?No  Do you often misplace items? No  Do you feel safe at home?  Yes  Advanced directives Does patient have a [place]? No Does patient have a Living Will? No   Objective:   Blood pressure 140/82, pulse 72, temperature 98 F (36.7 C), resp. rate 16, height 5' 8.25" (1.734 m), weight 211 lb (95.709 kg). Body mass index is 31.83 kg/(m^2).  General appearance: alert, no distress, WD/WN, male Cognitive Testing  Alert? Yes  Normal Appearance?Yes  Oriented to person? Yes  Place? Yes   Time? Yes  Recall of three objects?  Yes  Can perform simple calculations? Yes  Displays appropriate judgment?Yes  Can read the correct time from a watch face?Yes  HEENT: normocephalic, sclerae anicteric, TMs pearly, nares patent, no discharge or erythema, pharynx normal Oral cavity: MMM, no lesions Neck: supple, no lymphadenopathy, no thyromegaly, no masses Heart: RRR, normal S1, S2, + high pitched early systolic murmur LSB Lungs: CTA bilaterally, no wheezes, rhonchi, or rales Abdomen: +bs, soft, non tender, non distended, no masses, no hepatomegaly, no splenomegaly Musculoskeletal: nontender, no swelling, no obvious deformity Extremities: no edema, no cyanosis, no clubbing Pulses: 2+ symmetric, upper and lower extremities, normal cap refill Neurological: alert, oriented x 3, CN2-12 intact, strength normal upper extremities and lower extremities, sensation normal throughout, DTRs 2+ throughout, no cerebellar signs,  gait normal Psychiatric: normal affect, behavior normal, pleasant   Medicare Attestation I have personally reviewed: The patient's medical and social history Their use of alcohol, tobacco or illicit drugs Their current medications and supplements The patient's functional ability including ADLs,fall risks, home safety risks, cognitive, and hearing and visual impairment Diet and physical activities Evidence for depression or mood disorders  The patient's weight, height, BMI, and visual acuity have been recorded in the chart.  I have made referrals, counseling, and provided education to the patient based on review of the above and I have provided the patient with a written personalized care plan for preventive services.     Vicie Mutters, PA-C   06/28/2014

## 2014-06-29 LAB — HEPATIC FUNCTION PANEL
ALK PHOS: 53 U/L (ref 39–117)
ALT: 39 U/L (ref 0–53)
AST: 27 U/L (ref 0–37)
Albumin: 4.3 g/dL (ref 3.5–5.2)
Bilirubin, Direct: 0.1 mg/dL (ref 0.0–0.3)
Indirect Bilirubin: 0.8 mg/dL (ref 0.2–1.2)
TOTAL PROTEIN: 7.2 g/dL (ref 6.0–8.3)
Total Bilirubin: 0.9 mg/dL (ref 0.2–1.2)

## 2014-06-29 LAB — CBC WITH DIFFERENTIAL/PLATELET
BASOS PCT: 1 % (ref 0–1)
Basophils Absolute: 0.1 10*3/uL (ref 0.0–0.1)
EOS ABS: 0.1 10*3/uL (ref 0.0–0.7)
Eosinophils Relative: 1 % (ref 0–5)
HCT: 48.3 % (ref 39.0–52.0)
Hemoglobin: 17.5 g/dL — ABNORMAL HIGH (ref 13.0–17.0)
LYMPHS ABS: 2.7 10*3/uL (ref 0.7–4.0)
Lymphocytes Relative: 27 % (ref 12–46)
MCH: 32.1 pg (ref 26.0–34.0)
MCHC: 36.2 g/dL — AB (ref 30.0–36.0)
MCV: 88.5 fL (ref 78.0–100.0)
MONO ABS: 0.9 10*3/uL (ref 0.1–1.0)
MONOS PCT: 9 % (ref 3–12)
MPV: 10.4 fL (ref 8.6–12.4)
NEUTROS ABS: 6.2 10*3/uL (ref 1.7–7.7)
Neutrophils Relative %: 62 % (ref 43–77)
PLATELETS: 349 10*3/uL (ref 150–400)
RBC: 5.46 MIL/uL (ref 4.22–5.81)
RDW: 14.5 % (ref 11.5–15.5)
WBC: 10 10*3/uL (ref 4.0–10.5)

## 2014-06-29 LAB — LIPID PANEL
CHOL/HDL RATIO: 5.5 ratio
Cholesterol: 244 mg/dL — ABNORMAL HIGH (ref 0–200)
HDL: 44 mg/dL (ref >39)
LDL Cholesterol: 138 mg/dL — ABNORMAL HIGH (ref 0–99)
TRIGLYCERIDES: 311 mg/dL — AB (ref <150)
VLDL: 62 mg/dL — AB (ref 0–40)

## 2014-06-29 LAB — BASIC METABOLIC PANEL WITH GFR
BUN: 11 mg/dL (ref 6–23)
CHLORIDE: 104 meq/L (ref 96–112)
CO2: 24 mEq/L (ref 19–32)
CREATININE: 0.74 mg/dL (ref 0.50–1.35)
Calcium: 9.1 mg/dL (ref 8.4–10.5)
GFR, Est African American: >89 mL/min
Glucose, Bld: 127 mg/dL — ABNORMAL HIGH (ref 70–99)
Potassium: 3.6 mEq/L (ref 3.5–5.3)
Sodium: 139 mEq/L (ref 135–145)

## 2014-06-29 LAB — HEMOGLOBIN A1C
HEMOGLOBIN A1C: 6.1 % — AB (ref <5.7)
Mean Plasma Glucose: 128 mg/dL — ABNORMAL HIGH (ref <117)

## 2014-06-29 LAB — TSH: TSH: 0.898 u[IU]/mL (ref 0.350–4.500)

## 2014-06-29 LAB — MAGNESIUM: Magnesium: 2.1 mg/dL (ref 1.5–2.5)

## 2014-06-29 LAB — VITAMIN D 25 HYDROXY (VIT D DEFICIENCY, FRACTURES): VIT D 25 HYDROXY: 66 ng/mL (ref 30–100)

## 2014-08-17 ENCOUNTER — Other Ambulatory Visit: Payer: Self-pay | Admitting: Internal Medicine

## 2014-08-17 DIAGNOSIS — F411 Generalized anxiety disorder: Secondary | ICD-10-CM

## 2014-09-02 ENCOUNTER — Other Ambulatory Visit: Payer: Self-pay | Admitting: Internal Medicine

## 2014-09-20 ENCOUNTER — Ambulatory Visit: Payer: Self-pay | Admitting: Internal Medicine

## 2014-09-26 ENCOUNTER — Other Ambulatory Visit: Payer: Self-pay | Admitting: *Deleted

## 2014-09-26 ENCOUNTER — Encounter: Payer: Self-pay | Admitting: Internal Medicine

## 2014-09-26 ENCOUNTER — Ambulatory Visit (INDEPENDENT_AMBULATORY_CARE_PROVIDER_SITE_OTHER): Payer: Medicare Other | Admitting: Internal Medicine

## 2014-09-26 VITALS — BP 120/82 | HR 84 | Temp 97.2°F | Resp 16 | Ht 68.25 in | Wt 207.8 lb

## 2014-09-26 DIAGNOSIS — E559 Vitamin D deficiency, unspecified: Secondary | ICD-10-CM | POA: Diagnosis not present

## 2014-09-26 DIAGNOSIS — E785 Hyperlipidemia, unspecified: Secondary | ICD-10-CM | POA: Diagnosis not present

## 2014-09-26 DIAGNOSIS — Z79899 Other long term (current) drug therapy: Secondary | ICD-10-CM | POA: Diagnosis not present

## 2014-09-26 DIAGNOSIS — E119 Type 2 diabetes mellitus without complications: Secondary | ICD-10-CM

## 2014-09-26 DIAGNOSIS — I10 Essential (primary) hypertension: Secondary | ICD-10-CM

## 2014-09-26 DIAGNOSIS — L4050 Arthropathic psoriasis, unspecified: Secondary | ICD-10-CM

## 2014-09-26 LAB — CBC WITH DIFFERENTIAL/PLATELET
BASOS ABS: 0.1 10*3/uL (ref 0.0–0.1)
Basophils Relative: 1 % (ref 0–1)
EOS ABS: 0.1 10*3/uL (ref 0.0–0.7)
Eosinophils Relative: 2 % (ref 0–5)
HCT: 43.6 % (ref 39.0–52.0)
Hemoglobin: 16.1 g/dL (ref 13.0–17.0)
Lymphocytes Relative: 40 % (ref 12–46)
Lymphs Abs: 2.6 10*3/uL (ref 0.7–4.0)
MCH: 32.5 pg (ref 26.0–34.0)
MCHC: 36.9 g/dL — AB (ref 30.0–36.0)
MCV: 88.1 fL (ref 78.0–100.0)
MPV: 10.9 fL (ref 8.6–12.4)
Monocytes Absolute: 0.7 10*3/uL (ref 0.1–1.0)
Monocytes Relative: 11 % (ref 3–12)
Neutro Abs: 3 10*3/uL (ref 1.7–7.7)
Neutrophils Relative %: 46 % (ref 43–77)
Platelets: 285 10*3/uL (ref 150–400)
RBC: 4.95 MIL/uL (ref 4.22–5.81)
RDW: 14.1 % (ref 11.5–15.5)
WBC: 6.6 10*3/uL (ref 4.0–10.5)

## 2014-09-26 LAB — HEPATIC FUNCTION PANEL
ALT: 76 U/L — ABNORMAL HIGH (ref 0–53)
AST: 59 U/L — ABNORMAL HIGH (ref 0–37)
Albumin: 4.3 g/dL (ref 3.5–5.2)
Alkaline Phosphatase: 46 U/L (ref 39–117)
BILIRUBIN TOTAL: 0.9 mg/dL (ref 0.2–1.2)
Bilirubin, Direct: 0.1 mg/dL (ref 0.0–0.3)
Indirect Bilirubin: 0.8 mg/dL (ref 0.2–1.2)
TOTAL PROTEIN: 7.2 g/dL (ref 6.0–8.3)

## 2014-09-26 LAB — BASIC METABOLIC PANEL WITH GFR
BUN: 10 mg/dL (ref 6–23)
CO2: 27 mEq/L (ref 19–32)
CREATININE: 0.76 mg/dL (ref 0.50–1.35)
Calcium: 9.3 mg/dL (ref 8.4–10.5)
Chloride: 99 mEq/L (ref 96–112)
GFR, Est African American: >89 mL/min
GFR, Est Non African American: >89 mL/min
GLUCOSE: 94 mg/dL (ref 70–99)
Potassium: 4 mEq/L (ref 3.5–5.3)
Sodium: 136 mEq/L (ref 135–145)

## 2014-09-26 LAB — HEMOGLOBIN A1C
Hgb A1c MFr Bld: 5.9 % — ABNORMAL HIGH (ref <5.7)
Mean Plasma Glucose: 123 mg/dL — ABNORMAL HIGH (ref <117)

## 2014-09-26 LAB — LIPID PANEL
CHOL/HDL RATIO: 7.6 ratio
Cholesterol: 242 mg/dL — ABNORMAL HIGH (ref 0–200)
HDL: 32 mg/dL — ABNORMAL LOW (ref >=40)
TRIGLYCERIDES: 948 mg/dL — AB (ref <150)

## 2014-09-26 LAB — MAGNESIUM: Magnesium: 2.2 mg/dL (ref 1.5–2.5)

## 2014-09-26 MED ORDER — AMLODIPINE BESYLATE 5 MG PO TABS: 5.0000 mg | ORAL_TABLET | Freq: Every day | ORAL | Status: DC

## 2014-09-26 NOTE — Patient Instructions (Signed)
Exercise to Lose Weight Exercise and a healthy diet may help you lose weight. Your doctor may suggest specific exercises. EXERCISE IDEAS AND TIPS  Choose low-cost things you enjoy doing, such as walking, bicycling, or exercising to workout videos.  Take stairs instead of the elevator.  Walk during your lunch break.  Park your car further away from work or school.  Go to a gym or an exercise class.  Start with 5 to 10 minutes of exercise each day. Build up to 30 minutes of exercise 4 to 6 days a week.  Wear shoes with good support and comfortable clothes.  Stretch before and after working out.  Work out until you breathe harder and your heart beats faster.  Drink extra water when you exercise.  Do not do so much that you hurt yourself, feel dizzy, or get very short of breath. Exercises that burn about 150 calories:  Running 1  miles in 15 minutes.  Playing volleyball for 45 to 60 minutes.  Washing and waxing a car for 45 to 60 minutes.  Playing touch football for 45 minutes.  Walking 1  miles in 35 minutes.  Pushing a stroller 1  miles in 30 minutes.  Playing basketball for 30 minutes.  Raking leaves for 30 minutes.  Bicycling 5 miles in 30 minutes.  Walking 2 miles in 30 minutes.  Dancing for 30 minutes.  Shoveling snow for 15 minutes.  Swimming laps for 20 minutes.  Walking up stairs for 15 minutes.  Bicycling 4 miles in 15 minutes.  Gardening for 30 to 45 minutes.  Jumping rope for 15 minutes.  Washing windows or floors for 45 to 60 minutes. Document Released: 06/22/2010 Document Revised: 08/12/2011 Document Reviewed: 06/22/2010 ExitCare Patient Information 2015 [employer]. This information is not intended to replace advice given to you by your health care provider. Make sure you discuss any questions you have with your health care provider.  

## 2014-09-26 NOTE — Progress Notes (Signed)
Assessment and Plan:  Hypertension:  -Continue medication -monitor blood pressure at home. -Continue DASH diet -Reminder to go to the ER if any CP, SOB, nausea, dizziness, severe HA, changes vision/speech, left arm numbness and tingling and jaw pain.  Cholesterol - Continue diet and exercise -Check cholesterol.   Diabetes without complications -Continue diet and exercise.  -Check A1C  Vitamin D Def -check level -continue medications.   Continue diet and meds as discussed. Further disposition pending results of labs. Discussed med's effects and SE's.    HPI 69 y.o. male  presents for 3 month follow up with hypertension, hyperlipidemia, diabetes and vitamin D deficiency.   His blood pressure has been controlled at home, today their BP is BP: 120/82 mmHg.He does workout.  He is trying to walk frequently.   He denies chest pain, shortness of breath, dizziness.   He is not on cholesterol medication and denies myalgias. His cholesterol is not at goal. The cholesterol was:  06/28/2014: Cholesterol, Total 244*; HDL-C 44; LDL (calc) 138*; Triglycerides 311*   He has been working on diet and exercise for diabetes without complications, he is on bASA, he is not on ACE/ARB, and denies  foot ulcerations, hyperglycemia, hypoglycemia , increased appetite, nausea, paresthesia of the feet, polydipsia, polyuria, visual disturbances, vomiting and weight loss. Last A1C was: 06/28/2014: Hemoglobin-A1c 6.1*   Patient is on Vitamin D supplement. 06/28/2014: VITD 66    Current Medications:  Current Outpatient Prescriptions on File Prior to Visit  Medication Sig Dispense Refill  . ALPRAZolam (XANAX) 0.5 MG tablet TAKE 1 TABLET BY MOUTH THREE TIMES DAILY AS NEEDED 90 tablet 5  . amoxicillin-clavulanate (AUGMENTIN) 875-125 MG per tablet TAKE 1 TABLET BY MOUTH TWICE DAILY UNTIL ALL TAKEN 20 tablet 0  . aspirin 81 MG tablet Take 81 mg by mouth daily.    . Calcium Carbonate (CALCIUM 600 PO) Take by mouth  daily.    . Cholecalciferol (VITAMIN D3) 5000 UNITS TABS t tab  2 x day    . CLOBETASOL PROPIONATE E 0.05 % emollient cream APPLY TO THE AFFECTED AREA AS DIRECTED 180 g 99  . dexamethasone (DECADRON) 0.75 MG tablet TAKE 1/2 TO 1 TABLET BY MOUTH EVERY DAY 90 tablet 1  . Magnesium 500 MG CAPS Take 500 mg by mouth daily.    . Omega-3 Fatty Acids (FISH OIL) 1200 MG CAPS Take by mouth 3 (three) times daily.     No current facility-administered medications on file prior to visit.   Medical History:  Past Medical History  Diagnosis Date  . Hypertension   . Psoriatic arthritis   . Hyperlipidemia   . Hypogonadism male   . Osteoporosis   . Renal glycosuria   . Elevated LFTs   . Hx of colonic polyp    Allergies: No Known Allergies   Review of Systems:  Review of Systems  Constitutional: Negative for fever, chills and malaise/fatigue.  HENT: Negative for congestion, ear pain, sore throat and tinnitus.   Eyes: Negative.  Negative for blurred vision, double vision and pain.  Respiratory: Negative for cough, shortness of breath and wheezing.   Cardiovascular: Negative for chest pain, palpitations and leg swelling.  Gastrointestinal: Negative for heartburn, nausea, vomiting, abdominal pain, diarrhea, constipation, blood in stool and melena.  Genitourinary: Negative.   Musculoskeletal: Negative.   Skin:       Red face   Neurological: Negative for dizziness, sensory change, loss of consciousness and headaches.    Family history- Review and unchanged  Social history- Review and unchanged  Physical Exam: BP 120/82 mmHg  Pulse 84  Temp(Src) 97.2 F (36.2 C)  Resp 16  Ht 5' 8.25" (1.734 m)  Wt 207 lb 12.8 oz (94.257 kg)  BMI 31.35 kg/m2 Wt Readings from Last 3 Encounters:  09/26/14 207 lb 12.8 oz (94.257 kg)  06/28/14 211 lb (95.709 kg)  03/16/14 202 lb 3.2 oz (91.717 kg)   General Appearance: Well nourished well developed, non-toxic appearing, in no apparent distress. Eyes:  PERRLA, EOMs, conjunctiva no swelling or erythema ENT/Mouth: Ear canals clear with no erythema, swelling, or discharge.  TMs normal bilaterally, oropharynx clear, moist, with no exudate.   Neck: Supple, thyroid normal, no JVD, no cervical adenopathy.  Respiratory: Respiratory effort normal, breath sounds clear A&P, no wheeze, rhonchi or rales noted.  No retractions, no accessory muscle usage Cardio: RRR with soft murmur 2/6 on the left sternal border.  No RGs. No noted edema.  Abdomen: Soft, + BS.  Non tender, no guarding, rebound, hernias, masses. Musculoskeletal: Full ROM, 5/5 strength, Normal gait, joint swelling of the hands especially the MCP of the thumbs.   Skin: Warm, dry without rashes, lesions, ecchymosis.  Neuro: Awake and oriented X 3, Cranial nerves intact. No cerebellar symptoms.  Psych: normal affect, Insight and Judgment appropriate.    FORCUCCI, Tulip Meharg, PA-C 12:20 PM  [facility]

## 2014-09-27 LAB — INSULIN, RANDOM: Insulin: 9.8 u[IU]/mL (ref 2.0–19.6)

## 2014-09-29 ENCOUNTER — Other Ambulatory Visit: Payer: Self-pay | Admitting: Internal Medicine

## 2014-09-29 LAB — VITAMIN D 1,25 DIHYDROXY
VITAMIN D3 1, 25 (OH): 19 pg/mL
Vitamin D 1, 25 (OH)2 Total: 19 pg/mL (ref 18–72)
Vitamin D2 1, 25 (OH)2: <8 pg/mL

## 2014-09-29 MED ORDER — FENOFIBRATE 145 MG PO TABS: 145.0000 mg | ORAL_TABLET | Freq: Every day | ORAL | Status: DC

## 2014-11-09 NOTE — Addendum Note (Signed)
Addended by: Starlyn Skeans A on: 11/09/2014 01:15 PM   Modules accepted: Miquel Dunn

## 2014-11-28 ENCOUNTER — Other Ambulatory Visit: Payer: Self-pay

## 2014-12-26 ENCOUNTER — Other Ambulatory Visit: Payer: Self-pay | Admitting: Internal Medicine

## 2015-01-02 DIAGNOSIS — L409 Psoriasis, unspecified: Secondary | ICD-10-CM | POA: Diagnosis not present

## 2015-01-02 DIAGNOSIS — Z79899 Other long term (current) drug therapy: Secondary | ICD-10-CM | POA: Diagnosis not present

## 2015-01-02 DIAGNOSIS — L4050 Arthropathic psoriasis, unspecified: Secondary | ICD-10-CM | POA: Diagnosis not present

## 2015-03-07 ENCOUNTER — Other Ambulatory Visit: Payer: Self-pay | Admitting: Internal Medicine

## 2015-03-08 ENCOUNTER — Encounter: Payer: Self-pay | Admitting: Emergency Medicine

## 2015-03-22 ENCOUNTER — Ambulatory Visit (INDEPENDENT_AMBULATORY_CARE_PROVIDER_SITE_OTHER): Payer: Medicare Other | Admitting: Internal Medicine

## 2015-03-22 ENCOUNTER — Encounter: Payer: Self-pay | Admitting: Internal Medicine

## 2015-03-22 VITALS — BP 132/76 | HR 76 | Temp 97.0°F | Resp 16 | Ht 68.5 in | Wt 210.6 lb

## 2015-03-22 DIAGNOSIS — Z125 Encounter for screening for malignant neoplasm of prostate: Secondary | ICD-10-CM | POA: Diagnosis not present

## 2015-03-22 DIAGNOSIS — Z0001 Encounter for general adult medical examination with abnormal findings: Secondary | ICD-10-CM | POA: Diagnosis not present

## 2015-03-22 DIAGNOSIS — R7303 Prediabetes: Secondary | ICD-10-CM | POA: Insufficient documentation

## 2015-03-22 DIAGNOSIS — Z6831 Body mass index (BMI) 31.0-31.9, adult: Secondary | ICD-10-CM

## 2015-03-22 DIAGNOSIS — E785 Hyperlipidemia, unspecified: Secondary | ICD-10-CM | POA: Diagnosis not present

## 2015-03-22 DIAGNOSIS — L4050 Arthropathic psoriasis, unspecified: Secondary | ICD-10-CM

## 2015-03-22 DIAGNOSIS — Z1331 Encounter for screening for depression: Secondary | ICD-10-CM

## 2015-03-22 DIAGNOSIS — Z79899 Other long term (current) drug therapy: Secondary | ICD-10-CM

## 2015-03-22 DIAGNOSIS — Z789 Other specified health status: Secondary | ICD-10-CM

## 2015-03-22 DIAGNOSIS — E559 Vitamin D deficiency, unspecified: Secondary | ICD-10-CM | POA: Diagnosis not present

## 2015-03-22 DIAGNOSIS — E291 Testicular hypofunction: Secondary | ICD-10-CM

## 2015-03-22 DIAGNOSIS — E349 Endocrine disorder, unspecified: Secondary | ICD-10-CM

## 2015-03-22 DIAGNOSIS — I10 Essential (primary) hypertension: Secondary | ICD-10-CM | POA: Diagnosis not present

## 2015-03-22 DIAGNOSIS — R6889 Other general symptoms and signs: Secondary | ICD-10-CM

## 2015-03-22 DIAGNOSIS — Z6829 Body mass index (BMI) 29.0-29.9, adult: Secondary | ICD-10-CM | POA: Insufficient documentation

## 2015-03-22 DIAGNOSIS — Z1389 Encounter for screening for other disorder: Secondary | ICD-10-CM

## 2015-03-22 DIAGNOSIS — E119 Type 2 diabetes mellitus without complications: Secondary | ICD-10-CM

## 2015-03-22 DIAGNOSIS — Z1212 Encounter for screening for malignant neoplasm of rectum: Secondary | ICD-10-CM

## 2015-03-22 DIAGNOSIS — E669 Obesity, unspecified: Secondary | ICD-10-CM

## 2015-03-22 DIAGNOSIS — Z9181 History of falling: Secondary | ICD-10-CM

## 2015-03-22 LAB — CBC WITH DIFFERENTIAL/PLATELET
BASOS ABS: 0.1 10*3/uL (ref 0.0–0.1)
Basophils Relative: 1 % (ref 0–1)
EOS PCT: 2 % (ref 0–5)
Eosinophils Absolute: 0.1 10*3/uL (ref 0.0–0.7)
HCT: 48.4 % (ref 39.0–52.0)
Hemoglobin: 17.6 g/dL — ABNORMAL HIGH (ref 13.0–17.0)
Lymphocytes Relative: 41 % (ref 12–46)
Lymphs Abs: 2.7 10*3/uL (ref 0.7–4.0)
MCH: 33.1 pg (ref 26.0–34.0)
MCHC: 36.4 g/dL — ABNORMAL HIGH (ref 30.0–36.0)
MCV: 91 fL (ref 78.0–100.0)
MPV: 10.2 fL (ref 8.6–12.4)
Monocytes Absolute: 0.7 10*3/uL (ref 0.1–1.0)
Monocytes Relative: 11 % (ref 3–12)
Neutro Abs: 2.9 10*3/uL (ref 1.7–7.7)
Neutrophils Relative %: 45 % (ref 43–77)
PLATELETS: 294 10*3/uL (ref 150–400)
RBC: 5.32 MIL/uL (ref 4.22–5.81)
RDW: 14.4 % (ref 11.5–15.5)
WBC: 6.5 10*3/uL (ref 4.0–10.5)

## 2015-03-22 NOTE — Patient Instructions (Signed)
Recommend Adult Low Dose Aspirin or   coated  Aspirin 81 mg daily   To reduce risk of Colon Cancer 20 %,   Skin Cancer 26 % ,   Melanoma 46%   and   Pancreatic cancer 60%   ++++++++++++++++++++++++++++++++++++++++++++++++++++++  Vitamin D goal   is between 70-100.   Please make sure that you are taking your Vitamin D as directed.   It is very important as a natural anti-inflammatory   helping hair, skin, and nails, as well as reducing stroke and heart attack risk.   It helps your bones and helps with mood.  It also decreases numerous cancer risks so please take it as directed.   Low Vit D is associated with a 200-300% higher risk for CANCER   and 200-300% higher risk for HEART   ATTACK  &  STROKE.   .....................................Marland Kitchen  It is also associated with higher death rate at younger ages,   autoimmune diseases like Rheumatoid arthritis, Lupus, Multiple Sclerosis.     Also many other serious conditions, like depression, Alzheimer's  Dementia, infertility, muscle aches, fatigue, fibromyalgia - just to name a few.  ++++++++++++++++++++++++++++++++++++++++++++++++  Recommend the book "The END of DIETING" by Dr Excell Seltzer   & the book "The END of DIABETES " by Dr Excell Seltzer  At [email or web address] - get book & Audio CD's     Being diabetic has a  300% increased risk for heart attack, stroke, cancer, and alzheimer- type vascular dementia. It is very important that you work harder with diet by avoiding all foods that are white. Avoid white rice (brown & wild rice is OK), white potatoes (sweetpotatoes in moderation is OK), White bread or wheat bread or anything made out of white flour like bagels, donuts, rolls, buns, biscuits, cakes, pastries, cookies, pizza crust, and pasta (made from white flour & egg whites) - vegetarian pasta or spinach or wheat pasta is OK. Multigrain breads like Arnold's or [employer], or multigrain sandwich thins or flatbreads.  Diet,  exercise and weight loss can reverse and cure diabetes in the early stages.  Diet, exercise and weight loss is very important in the control and prevention of complications of diabetes which affects every system in your body, ie. Brain - dementia/stroke, eyes - glaucoma/blindness, heart - heart attack/heart failure, kidneys - dialysis, stomach - gastric paralysis, intestines - malabsorption, nerves - severe painful neuritis, circulation - gangrene & loss of a leg(s), and finally cancer and Alzheimers.    I recommend avoid fried & greasy foods,  sweets/candy, white rice (brown or wild rice or Quinoa is OK), white potatoes (sweet potatoes are OK) - anything made from white flour - bagels, doughnuts, rolls, buns, biscuits,white and wheat breads, pizza crust and traditional pasta made of white flour & egg white(vegetarian pasta or spinach or wheat pasta is OK).  Multi-grain bread is OK - like multi-grain flat bread or sandwich thins. Avoid alcohol in excess. Exercise is also important.    Eat all the vegetables you want - avoid meat, especially red meat and dairy - especially cheese.  Cheese is the most concentrated form of trans-fats which is the worst thing to clog up our arteries. Veggie cheese is OK which can be found in the fresh produce section at [employer] or [employer] or [employer]  ++++++++++++++++++++++++++++++++++++++++++++++++++ DASH Eating Plan  DASH stands for "Dietary Approaches to Stop Hypertension."   The DASH eating plan is a healthy eating plan that has been shown to reduce high  blood pressure (hypertension). Additional health benefits may include reducing the risk of type 2 diabetes mellitus, heart disease, and stroke. The DASH eating plan may also help with weight loss.  WHAT DO I NEED TO KNOW ABOUT THE DASH EATING PLAN? For the DASH eating plan, you will follow these general guidelines:  Choose foods with a percent daily value for sodium of less than 5% (as listed on the food  label).  Use salt-free seasonings or herbs instead of table salt or sea salt.  Check with your health care provider or pharmacist before using salt substitutes.  Eat lower-sodium products, often labeled as "lower sodium" or "no salt added."  Eat fresh foods.  Eat more vegetables, fruits, and low-fat dairy products.    Choose whole grains. Look for the word "whole" as the first word in the ingredient list.  Choose fish   Limit sweets, desserts, sugars, and sugary drinks.  Choose heart-healthy fats.  Eat veggie cheese   Eat more home-cooked food and less restaurant, buffet, and fast food.  Limit fried foods.  Cook foods using methods other than frying.  Limit canned vegetables. If you do use them, rinse them well to decrease the sodium.  When eating at a restaurant, ask that your food be prepared with less salt, or no salt if possible.                      WHAT FOODS CAN I EAT?  Seek help from a dietitian for individual calorie needs. Grains Whole grain or whole wheat bread. Brown rice. Whole grain or whole wheat pasta. Quinoa, bulgur, and whole grain cereals. Low-sodium cereals. Corn or whole wheat flour tortillas. Whole grain cornbread. Whole grain crackers. Low-sodium crackers.  Vegetables Fresh or frozen vegetables (raw, steamed, roasted, or grilled). Low-sodium or reduced-sodium tomato and vegetable juices. Low-sodium or reduced-sodium tomato sauce and paste. Low-sodium or reduced-sodium canned vegetables.   Fruits All fresh, canned (in natural juice), or frozen fruits.  Meat and Other Protein Products  All fish and seafood.  Dried beans, peas, or lentils. Unsalted nuts and seeds. Unsalted canned beans. Dairy Low-fat dairy products, such as skim or 1% milk, 2% or reduced-fat cheeses, low-fat ricotta or cottage cheese, or plain low-fat yogurt. Low-sodium or reduced-sodium cheeses.  Fats and Oils Tub margarines without trans fats. Light or reduced-fat mayonnaise  and salad dressings (reduced sodium). Avocado. Safflower, olive, or canola oils. Natural peanut or almond butter.  Other Unsalted popcorn and pretzels. The items listed above may not be a complete list of recommended foods or beverages. Contact your dietitian for more options.  +++++++++++++++++++++++++++++++++++++++++++  WHAT FOODS ARE NOT RECOMMENDED?  Grains/ White flour or wheat flour  White bread. White pasta. White rice. Refined cornbread. Bagels and croissants. Crackers that contain trans fat.  Vegetables  Creamed or fried vegetables. Vegetables in a . Regular canned vegetables. Regular canned tomato sauce and paste. Regular tomato and vegetable juices.  Fruits Dried fruits. Canned fruit in light or heavy syrup. Fruit juice.  Meat and Other Protein Products Meat in general. Fatty cuts of meat. Ribs, chicken wings, bacon, sausage, bologna, salami, chitterlings, fatback, hot dogs, bratwurst, and packaged luncheon meats. Salted nuts and seeds. Canned beans with salt.  Dairy Whole or 2% milk, cream, half-and-half, and cream cheese. Whole-fat or sweetened yogurt. Full-fat cheeses or blue cheese. Nondairy creamers and whipped toppings. Processed cheese, cheese spreads, or cheese curds.  Condiments Onion and garlic salt, seasoned salt, table salt, and sea  salt. Canned and packaged gravies. Worcestershire sauce. Tartar sauce. Barbecue sauce. Teriyaki sauce. Soy sauce, including reduced sodium. Steak sauce. Fish sauce. Oyster sauce. Cocktail sauce. Horseradish. Ketchup and mustard. Meat flavorings and tenderizers. Bouillon cubes. Hot sauce. Tabasco sauce. Marinades. Taco seasonings. Relishes.  Fats and Oils Butter, stick margarine, lard, shortening, ghee, and bacon fat. Coconut, palm kernel, or palm oils. Regular salad dressings.  Pickles and olives. Salted popcorn and pretzels. The items listed above may not be a complete list of foods and beverages to avoid.   Preventive Care for  Adults  A healthy lifestyle and preventive care can promote health and wellness. Preventive health guidelines for men include the following key practices:  A routine yearly physical is a good way to check with your health care provider about your health and preventative screening. It is a chance to share any concerns and updates on your health and to receive a thorough exam.  Visit your dentist for a routine exam and preventative care every 6 months. Brush your teeth twice a day and floss once a day. Good oral hygiene prevents tooth decay and gum disease.  The frequency of eye exams is based on your age, health, family medical history, use of contact lenses, and other factors. Follow your health care provider's recommendations for frequency of eye exams.  Eat a healthy diet. Foods such as vegetables, fruits, whole grains, low-fat dairy products, and lean protein foods contain the nutrients you need without too many calories. Decrease your intake of foods high in solid fats, added sugars, and salt. Eat the right amount of calories for you.Get information about a proper diet from your health care provider, if necessary.  Regular physical exercise is one of the most important things you can do for your health. Most adults should get at least 150 minutes of moderate-intensity exercise (any activity that increases your heart rate and causes you to sweat) each week. In addition, most adults need muscle-strengthening exercises on 2 or more days a week.  Maintain a healthy weight. The body mass index (BMI) is a screening tool to identify possible weight problems. It provides an estimate of body fat based on height and weight. Your health care provider can find your BMI and can help you achieve or maintain a healthy weight.For adults 20 years and older:  A BMI below 18.5 is considered underweight.  A BMI of 18.5 to 24.9 is normal.  A BMI of 25 to 29.9 is considered overweight.  A BMI of 30 and above  is considered obese.  Maintain normal blood lipids and cholesterol levels by exercising and minimizing your intake of saturated fat. Eat a balanced diet with plenty of fruit and vegetables. Blood tests for lipids and cholesterol should begin at age 61 and be repeated every 5 years. If your lipid or cholesterol levels are high, you are over 50, or you are at high risk for heart disease, you may need your cholesterol levels checked more frequently.Ongoing high lipid and cholesterol levels should be treated with medicines if diet and exercise are not working.  If you smoke, find out from your health care provider how to quit. If you do not use tobacco, do not start.  Lung cancer screening is recommended for adults aged 49-80 years who are at high risk for developing lung cancer because of a history of smoking. A yearly low-dose CT scan of the lungs is recommended for people who have at least a 30-pack-year history of smoking and are a  current smoker or have quit within the past 15 years. A pack year of smoking is smoking an average of 1 pack of cigarettes a day for 1 year (for example: 1 pack a day for 30 years or 2 packs a day for 15 years). Yearly screening should continue until the smoker has stopped smoking for at least 15 years. Yearly screening should be stopped for people who develop a health problem that would prevent them from having lung cancer treatment.  If you choose to drink alcohol, do not have more than 2 drinks per day. One drink is considered to be 12 ounces (355 mL) of beer, 5 ounces (148 mL) of wine, or 1.5 ounces (44 mL) of liquor.  Avoid use of street drugs. Do not share needles with anyone. Ask for help if you need support or instructions about stopping the use of drugs.  High blood pressure causes heart disease and increases the risk of stroke. Your blood pressure should be checked at least every 1-2 years. Ongoing high blood pressure should be treated with medicines, if weight loss  and exercise are not effective.  If you are 18-37 years old, ask your health care provider if you should take aspirin to prevent heart disease.  Diabetes screening involves taking a blood sample to check your fasting blood sugar level. Testing should be considered at a younger age or be carried out more frequently if you are overweight and have at least 1 risk factor for diabetes.  Colorectal cancer can be detected and often prevented. Most routine colorectal cancer screening begins at the age of 80 and continues through age 15. However, your health care provider may recommend screening at an earlier age if you have risk factors for colon cancer. On a yearly basis, your health care provider may provide home test kits to check for hidden blood in the stool. Use of a small camera at the end of a tube to directly examine the colon (sigmoidoscopy or colonoscopy) can detect the earliest forms of colorectal cancer. Talk to your health care provider about this at age 53, when routine screening begins. Direct exam of the colon should be repeated every 5-10 years through age 11, unless early forms of precancerous polyps or small growths are found.  Hepatitis C blood testing is recommended for all people born from [birth year] through 1963 and any individual with known risks for hepatitis C.  Screening for abdominal aortic aneurysm (AAA)  by ultrasound is recommended for people who have history of high blood pressure or who are current or former smokers.  Healthy men should  receive prostate-specific antigen (PSA) blood tests as part of routine cancer screening. Talk with your health care provider about prostate cancer screening.  Testicular cancer screening is  recommended for adult males. Screening includes self-exam, a health care provider exam, and other screening tests. Consult with your health care provider about any symptoms you have or any concerns you have about testicular cancer.  Use sunscreen. Apply  sunscreen liberally and repeatedly throughout the day. You should seek shade when your shadow is shorter than you. Protect yourself by wearing long sleeves, pants, a wide-brimmed hat, and sunglasses year round, whenever you are outdoors.  Once a month, do a whole-body skin exam, using a mirror to look at the skin on your back. Tell your health care provider about new moles, moles that have irregular borders, moles that are larger than a pencil eraser, or moles that have changed in shape or color.  Stay current with required vaccines (immunizations).  Influenza vaccine. All adults should be immunized every year.  Tetanus, diphtheria, and acellular pertussis (Td, Tdap) vaccine. An adult who has not previously received Tdap or who does not know his vaccine status should receive 1 dose of Tdap. This initial dose should be followed by tetanus and diphtheria toxoids (Td) booster doses every 10 years. Adults with an unknown or incomplete history of completing a 3-dose immunization series with Td-containing vaccines should begin or complete a primary immunization series including a Tdap dose. Adults should receive a Td booster every 10 years.  Zoster vaccine. One dose is recommended for adults aged 13 years or older unless certain conditions are present.    PREVNAR - Pneumococcal 13-valent conjugate (PCV13) vaccine. When indicated, a person who is uncertain of his immunization history and has no record of immunization should receive the PCV13 vaccine. An adult aged 84 years or older who has certain medical conditions and has not been previously immunized should receive 1 dose of PCV13 vaccine. This PCV13 should be followed with a dose of pneumococcal polysaccharide (PPSV23) vaccine. The PPSV23 vaccine dose should be obtained at least 8 weeks after the dose of PCV13 vaccine. An adult aged 29 years or older who has certain medical conditions and previously received 1 or more doses of PPSV23 vaccine should  receive 1 dose of PCV13. The PCV13 vaccine dose should be obtained 1 or more years after the last PPSV23 vaccine dose.    PNEUMOVAX - Pneumococcal polysaccharide (PPSV23) vaccine. When PCV13 is also indicated, PCV13 should be obtained first. All adults aged 14 years and older should be immunized. An adult younger than age 85 years who has certain medical conditions should be immunized. Any person who resides in a nursing home or long-term care facility should be immunized. An adult smoker should be immunized. People with an immunocompromised condition and certain other conditions should receive both PCV13 and PPSV23 vaccines. People with human immunodeficiency virus (HIV) infection should be immunized as soon as possible after diagnosis. Immunization during chemotherapy or radiation therapy should be avoided. Routine use of PPSV23 vaccine is not recommended for American Indians, Industry Natives, or people younger than 65 years unless there are medical conditions that require PPSV23 vaccine. When indicated, people who have unknown immunization and have no record of immunization should receive PPSV23 vaccine. One-time revaccination 5 years after the first dose of PPSV23 is recommended for people aged 19-64 years who have chronic kidney failure, nephrotic syndrome, asplenia, or immunocompromised conditions. People who received 1-2 doses of PPSV23 before age 75 years should receive another dose of PPSV23 vaccine at age 31 years or later if at least 5 years have passed since the previous dose. Doses of PPSV23 are not needed for people immunized with PPSV23 at or after age 16 years.    Hepatitis A vaccine. Adults who wish to be protected from this disease, have certain high-risk conditions, work with hepatitis A-infected animals, work in hepatitis A research labs, or travel to or work in countries with a high rate of hepatitis A should be immunized. Adults who were previously unvaccinated and who anticipate close  contact with an international adoptee during the first 60 days after arrival in the [place] from a country with a high rate of hepatitis A should be immunized.    Hepatitis B vaccine. Adults should be immunized if they wish to be protected from this disease, have certain high-risk conditions, may be exposed to blood or other  infectious body fluids, are household contacts or sex partners of hepatitis B positive people, are clients or workers in certain care facilities, or travel to or work in countries with a high rate of hepatitis B.   Preventive Service / Frequency   Ages 54 and over  Blood pressure check.  Lipid and cholesterol check.  Lung cancer screening. / Every year if you are aged 75-80 years and have a 30-pack-year history of smoking and currently smoke or have quit within the past 15 years. Yearly screening is stopped once you have quit smoking for at least 15 years or develop a health problem that would prevent you from having lung cancer treatment.  Fecal occult blood test (FOBT) of stool. You may not have to do this test if you get a colonoscopy every 10 years.  Flexible sigmoidoscopy** or colonoscopy.** / Every 5 years for a flexible sigmoidoscopy or every 10 years for a colonoscopy beginning at age 35 and continuing until age 77.  Hepatitis C blood test.** / For all people born from [birth year] through 1963 and any individual with known risks for hepatitis C.  Abdominal aortic aneurysm (AAA) screening./ Screening current or former smokers or have Hypertension.  Skin self-exam. / Monthly.  Influenza vaccine. / Every year.  Tetanus, diphtheria, and acellular pertussis (Tdap/Td) vaccine.** / 1 dose of Td every 10 years.   Zoster vaccine.** / 1 dose for adults aged 7 years or older.         Pneumococcal 13-valent conjugate (PCV13) vaccine.    Pneumococcal polysaccharide (PPSV23) vaccine.     Hepatitis A vaccine.** / Consult your health care provider.  Hepatitis  B vaccine.** / Consult your health care provider. Screening for abdominal aortic aneurysm (AAA)  by ultrasound is recommended for people who have history of high blood pressure or who are current or former smokers.

## 2015-03-22 NOTE — Progress Notes (Addendum)
Patient ID: Melvin Shelton, male   DOB: 01-Oct-1945, 69 y.o.   MRN: [id number]   Comprehensive Examination     This very nice 69 y.o.male presents for complete physical.  Patient has been followed for HTN, Prediabetes, Hyperlipidemia, Psoriatic Arthritis and Vitamin D Deficiency.     Patient has hx/o Psoriasis since his 20's and has been sx with Psoriatic Arthritis of his hands since 2004. He's currently on Humria and is followed by Dermatologist Dr Lyman Speller in [place].       HTN predates since 2013 and has been labile and is being monitored expectantly. Patient's BP has been controlled at home.Today's BP: 132/76 mmHg. Patient denies any cardiac symptoms as chest pain, palpitations, shortness of breath, dizziness or ankle swelling.      Patient's hyperlipidemia is controlled with diet and medications. Patient denies myalgias or other medication SE's. Last lipids were not at goal with Cholesterol 242*; HDL 32*; LDL NOT CALC; Triglycerides 948 on 09/26/2014.       Patient has prediabetes since 2006 and he's attempting to control with diet and patient denies reactive hypoglycemic symptoms, visual blurring, diabetic polys or paresthesias. Last A1c was 5.9% on 09/26/2014.      Finally, patient has history of Vitamin D Deficiency and last vitamin D was 66 on 06/28/2014.      Medication Sig  . Adalimumab (HUMIRA PEN [place]) Inject into the skin. Injection every 2 weeks.  . ALPRAZolam 0.5 MG tablet TAKE 1 TABLET BY MOUTH THREE TIMES DAILY AS NEEDED  . amLODipine  5 MG tablet Take 1 tablet (5 mg total) by mouth daily.  Marland Kitchen aspirin 81 MG tablet Take 81 mg by mouth daily.  Marland Kitchen CALCIUM 600  Take by mouth daily.  Celedonio Miyamoto D 5000 UNITS t tab  2 x day  . CLOBETASOL PROPIONATE E 0.05 % emollient cream APPLY TO THE AFFECTED AREA AS DIRECTED  . dexamethasone  0.75 MG tablet TAKE 1/2 TO 1 TABLET BY MOUTH EVERY DAY  . fenofibrate  145 MG tablet Take 1 tablet (145 mg total) by mouth daily.  . Magnesium 500 MG CAPS Take 500 mg  by mouth daily.  Marland Kitchen FISH OIL 1200 MG CAPS Take by mouth 3 (three) times daily.   No Known Allergies   Past Medical History  Diagnosis Date  . Hypertension   . Psoriatic arthritis ([place])   . Hyperlipidemia   . Hypogonadism male   . Osteoporosis   . Renal glycosuria ([place])   . Elevated LFTs   . Hx of colonic polyp    Health Maintenance  Topic Date Due  . OPHTHALMOLOGY EXAM  10/09/1957  . TETANUS/TDAP  10/10/1966  . COLONOSCOPY  10/09/1997  . ZOSTAVAX  10/10/2007  . PNA vac Low Risk Adult (1 of 2 - PCV13) 10/09/2012  . INFLUENZA VACCINE  01/02/2015  . URINE MICROALBUMIN  03/17/2015  . HEMOGLOBIN A1C  03/28/2015  . FOOT EXAM  06/29/2015  . Hepatitis C Screening  Completed   Immunization History  Administered Date(s) Administered  . DT 03/17/2014  . Pneumococcal-Unspecified 06/04/2003   No past surgical history on file. Family History  Problem Relation Age of Onset  . Cancer Mother     lymphoma  . Heart disease Father   . Hypertension Father   . Diabetes Father   . Cancer Father     prostate   Social History   Social History  . Marital Status: Married    Spouse Name: N/A  . Number  of Children: N/A  . Years of Education: N/A   Occupational History  . Not on file.   Social History Main Topics  . Smoking status: Never Smoker   . Smokeless tobacco: Not on file  . Alcohol Use: Yes     Comment: ocassional  . Drug Use: Not on file  . Sexual Activity: Not on file    ROS Constitutional: Denies fever, chills, weight loss/gain, headaches, insomnia,  night sweats or change in appetite. Does c/o fatigue. Eyes: Denies redness, blurred vision, diplopia, discharge, itchy or watery eyes.  ENT: Denies discharge, congestion, post nasal drip, epistaxis, sore throat, earache, hearing loss, dental pain, Tinnitus, Vertigo, Sinus pain or snoring.  Cardio: Denies chest pain, palpitations, irregular heartbeat, syncope, dyspnea, diaphoresis, orthopnea, PND, claudication or  edema Respiratory: denies cough, dyspnea, DOE, pleurisy, hoarseness, laryngitis or wheezing.  Gastrointestinal: Denies dysphagia, heartburn, reflux, water brash, pain, cramps, nausea, vomiting, bloating, diarrhea, constipation, hematemesis, melena, hematochezia, jaundice or hemorrhoids Genitourinary: Denies dysuria, frequency, urgency, nocturia, hesitancy, discharge, hematuria or flank pain Musculoskeletal: Denies arthralgia, myalgia, stiffness, Jt. Swelling, pain, limp or strain/sprain. Denies Falls. Skin: Denies puritis, rash, hives, warts, acne, eczema or change in skin lesion Neuro: No weakness, tremor, incoordination, spasms, paresthesia or pain Psychiatric: Denies confusion, memory loss or sensory loss. Denies Depression. Endocrine: Denies change in weight, skin, hair change, nocturia, and paresthesia, diabetic polys, visual blurring or hyper / hypo glycemic episodes.  Heme/Lymph: No excessive bleeding, bruising or enlarged lymph nodes.  Physical Exam  BP 132/76 mmHg  Pulse 76  Temp(Src) 97 F (36.1 C)  Resp 16  Ht 5' 8.5" (1.74 m)  Wt 210 lb 9.6 oz (95.528 kg)  BMI 31.55 kg/m2  General Appearance: Well nourished &  in no apparent distress. Eyes: PERRLA, EOMs, conjunctiva no swelling or erythema, normal fundi and vessels. Sinuses: No frontal/maxillary tenderness ENT/Mouth: EACs patent / TMs  nl. Nares clear without erythema, swelling, mucoid exudates. Oral hygiene is good. No erythema, swelling, or exudate. Tongue normal, non-obstructing. Tonsils not swollen or erythematous. Hearing normal.  Neck: Supple, thyroid normal. No bruits, nodes or JVD. Respiratory: Respiratory effort normal.  BS equal and clear bilateral without rales, rhonci, wheezing or stridor. Cardio: Heart sounds are normal with regular rate and rhythm and no murmurs, rubs or gallops. Peripheral pulses are normal and equal bilaterally without edema. No aortic or femoral bruits. Chest: symmetric with normal excursions  and percussion.  Abdomen: Flat, soft, with bowel sounds. Nontender, no guarding, rebound, hernias, masses, or organomegaly.  Lymphatics: Non tender without lymphadenopathy.  Genitourinary: No hernias.Testes nl. DRE - prostate nl for age - smooth & firm w/o nodules. Musculoskeletal: Full ROM all peripheral extremities, joint stability, 5/5 strength, and normal gait. Skin: Warm and dry without rashes, lesions, cyanosis, clubbing or  ecchymosis.  Neuro: Cranial nerves intact, reflexes equal bilaterally. Normal muscle tone, no cerebellar symptoms. Sensation intact bilaterally by Monofilament & vibratory testing to the toes.  Pysch: Alert and oriented X 3 with normal affect, insight and judgment appropriate.   Assessment and Plan  1. Encounter for general adult medical examination with abnormal findings  - Microalbumin / creatinine urine ratio - EKG 12-Lead - [place], RETROPERITNL ABD,  LTD - POC Hemoccult Bld/Stl ( - PSA - Urinalysis, Routine w reflex microscopic  - CBC with Differential/Platelet - BASIC METABOLIC PANEL WITH GFR - Hepatic function panel - Magnesium - Lipid panel - TSH - Hemoglobin A1c - Insulin, random - Vit D  25 hydroxy   2. Essential  hypertension  - EKG 12-Lead - [place], RETROPERITNL ABD,  LTD - TSH  3. Hyperlipidemia  - Lipid panel  4. Diabetes mellitus without complication ([place])  - Microalbumin / creatinine urine ratio - Hemoglobin A1c - Insulin, random  5. Vitamin D deficiency  - Vit D  25 hydroxy   6. Testosterone deficiency   7. Obesity   8. BMI 31.56,  adult   9. Screening for rectal cancer  - POC Hemoccult Bld/Stl   10. Prostate cancer screening  - PSA  11. Psoriatic arthritis ([place])   12. At low risk for fall   13. Depression screen   14. Medication management  - Urinalysis, Routine w reflex microscopic  - CBC with Differential/Platelet - BASIC METABOLIC PANEL WITH GFR - Hepatic function panel - Magnesium   Continue  prudent diet as discussed, weight control, BP monitoring, regular exercise, and medications as discussed.  Discussed med effects and SE's. Routine screening labs and tests as requested with regular follow-up as recommended.  Over 40 minutes of exam, counseling &  chart review was performed

## 2015-03-23 ENCOUNTER — Other Ambulatory Visit: Payer: Self-pay | Admitting: Internal Medicine

## 2015-03-23 LAB — HEPATIC FUNCTION PANEL
ALBUMIN: 4.4 g/dL (ref 3.6–5.1)
ALK PHOS: 35 U/L — AB (ref 40–115)
ALT: 93 U/L — ABNORMAL HIGH (ref 9–46)
AST: 56 U/L — ABNORMAL HIGH (ref 10–35)
BILIRUBIN INDIRECT: 0.8 mg/dL (ref 0.2–1.2)
BILIRUBIN TOTAL: 1 mg/dL (ref 0.2–1.2)
Bilirubin, Direct: 0.2 mg/dL (ref <=0.2)
Total Protein: 7.5 g/dL (ref 6.1–8.1)

## 2015-03-23 LAB — BASIC METABOLIC PANEL WITH GFR
BUN: 13 mg/dL (ref 7–25)
CO2: 25 mmol/L (ref 20–31)
Calcium: 9.7 mg/dL (ref 8.6–10.3)
Chloride: 104 mmol/L (ref 98–110)
Creat: 1.01 mg/dL (ref 0.70–1.25)
GFR, EST AFRICAN AMERICAN: 89 mL/min (ref >=60)
GFR, EST NON AFRICAN AMERICAN: 77 mL/min (ref >=60)
Glucose, Bld: 120 mg/dL — ABNORMAL HIGH (ref 65–99)
POTASSIUM: 4.1 mmol/L (ref 3.5–5.3)
Sodium: 141 mmol/L (ref 135–146)

## 2015-03-23 LAB — MAGNESIUM: Magnesium: 2 mg/dL (ref 1.5–2.5)

## 2015-03-23 LAB — MICROALBUMIN / CREATININE URINE RATIO
CREATININE, URINE: 80 mg/dL (ref 20–370)
Microalb Creat Ratio: 69 mcg/mg creat — ABNORMAL HIGH (ref <30)
Microalb, Ur: 5.5 mg/dL

## 2015-03-23 LAB — LIPID PANEL
CHOLESTEROL: 232 mg/dL — AB (ref 125–200)
HDL: 49 mg/dL (ref >=40)
LDL CALC: 138 mg/dL — AB (ref <130)
TRIGLYCERIDES: 223 mg/dL — AB (ref <150)
Total CHOL/HDL Ratio: 4.7 Ratio (ref <=5.0)
VLDL: 45 mg/dL — ABNORMAL HIGH (ref <30)

## 2015-03-23 LAB — VITAMIN D 25 HYDROXY (VIT D DEFICIENCY, FRACTURES): Vit D, 25-Hydroxy: 58 ng/mL (ref 30–100)

## 2015-03-23 LAB — HEMOGLOBIN A1C
Hgb A1c MFr Bld: 6 % — ABNORMAL HIGH (ref <5.7)
Mean Plasma Glucose: 126 mg/dL — ABNORMAL HIGH (ref <117)

## 2015-03-23 LAB — URINALYSIS, ROUTINE W REFLEX MICROSCOPIC
Bilirubin Urine: NEGATIVE
HGB URINE DIPSTICK: NEGATIVE
KETONES UR: NEGATIVE
Leukocytes, UA: NEGATIVE
NITRITE: NEGATIVE
Protein, ur: NEGATIVE
SPECIFIC GRAVITY, URINE: 1.014 (ref 1.001–1.035)
pH: 6.5 (ref 5.0–8.0)

## 2015-03-23 LAB — TSH: TSH: 2.401 u[IU]/mL (ref 0.350–4.500)

## 2015-03-23 LAB — INSULIN, RANDOM: INSULIN: 16.5 u[IU]/mL (ref 2.0–19.6)

## 2015-03-23 LAB — PSA: PSA: 0.02 ng/mL (ref <=4.00)

## 2015-04-07 ENCOUNTER — Other Ambulatory Visit: Payer: Self-pay | Admitting: Physician Assistant

## 2015-04-07 ENCOUNTER — Other Ambulatory Visit: Payer: Self-pay | Admitting: *Deleted

## 2015-04-07 MED ORDER — PENICILLIN V POTASSIUM 500 MG PO TABS: ORAL_TABLET | ORAL | Status: DC

## 2015-04-27 ENCOUNTER — Other Ambulatory Visit: Payer: Self-pay | Admitting: Internal Medicine

## 2015-05-04 ENCOUNTER — Encounter: Payer: Self-pay | Admitting: Internal Medicine

## 2015-05-05 ENCOUNTER — Encounter: Payer: Self-pay | Admitting: Gastroenterology

## 2015-05-05 ENCOUNTER — Ambulatory Visit (INDEPENDENT_AMBULATORY_CARE_PROVIDER_SITE_OTHER): Payer: Medicare Other | Admitting: Internal Medicine

## 2015-05-05 ENCOUNTER — Encounter: Payer: Self-pay | Admitting: Internal Medicine

## 2015-05-05 VITALS — BP 130/82 | HR 60 | Temp 97.5°F | Resp 16 | Ht 68.5 in | Wt 202.6 lb

## 2015-05-05 DIAGNOSIS — D225 Melanocytic nevi of trunk: Secondary | ICD-10-CM

## 2015-05-05 DIAGNOSIS — I10 Essential (primary) hypertension: Secondary | ICD-10-CM

## 2015-05-05 DIAGNOSIS — D2262 Melanocytic nevi of left upper limb, including shoulder: Secondary | ICD-10-CM | POA: Diagnosis not present

## 2015-05-05 NOTE — Progress Notes (Signed)
Subjective:    Patient ID: Melvin Shelton, male    DOB: April 23, 1946, 69 y.o.   MRN: [id number]  HPI  This very nice patient with HTN, HLD, T2_DM,Vit D def presents for recheck of BP . He denies any HA, dizziness, CP, palpitations, dyspnea or dependent edema. He also is concerned about several dark spots on his back.   Medication Sig  . Adalimumab (HUMIRA PEN [place]) Inject into the skin. Injection every 2 weeks.  . ALPRAZolam (XANAX) 0.5 MG tablet TAKE 1 TABLET BY MOUTH THREE TIMES DAILY AS NEEDED  . amLODipine (NORVASC) 5 MG tablet TAKE 1 TABLET(5 MG) BY MOUTH DAILY  . aspirin 81 MG tablet Take 81 mg by mouth daily.  . Calcium Carbonate (CALCIUM 600 PO) Take by mouth daily.  . Cholecalciferol (VITAMIN D3) 5000 UNITS TABS t tab  2 x day  . CLOBETASOL PROPIONATE E 0.05 % emollient cream APPLY TO THE AFFECTED AREA AS DIRECTED  . dexamethasone (DECADRON) 0.75 MG tablet TAKE 1/2 TO 1 TABLET BY MOUTH EVERY DAY  . fenofibrate (TRICOR) 145 MG tablet Take 1 tablet (145 mg total) by mouth daily.  . Magnesium 500 MG CAPS Take 500 mg by mouth daily.  . Omega-3 Fatty Acids (FISH OIL) 1200 MG CAPS Take by mouth 3 (three) times daily.  . penicillin v potassium (VEETID) 500 MG tablet TK 1 T PO QID   No Known Allergies   Past Medical History  Diagnosis Date  . Hypertension   . Psoriatic arthritis ([place])   . Hyperlipidemia   . Hypogonadism male   . Osteoporosis   . Renal glycosuria ([place])   . Elevated LFTs   . Hx of colonic polyp    Review of Systems  10 point systems review negative except as above.     Objective:   Physical Exam  BP 130/82 mmHg  Pulse 60  Temp(Src) 97.5 F (36.4 C)  Resp 16  Ht 5' 8.5" (1.74 m)  Wt 202 lb 9.6 oz (91.899 kg)  BMI 30.35 kg/m2  HEENT - Eac's patent. TM's Nl. EOM's full. PERRLA. NasoOroPharynx clear. Neck - supple. Nl Thyroid. Carotids 2+ & No bruits, nodes, JVD Chest - Clear equal BS w/o Rales, rhonchi, wheezes. Cor - Nl HS. RRR w/o sig MGR. PP 1(+). No  edema. Abd - No palpable organomegaly, masses or tenderness. BS nl. MS- FROM w/o deformities. Muscle power, tone and bulk Nl. Gait Nl. Neuro - No obvious Cr N abnormalities. Sensory, motor and Cerebellar functions appear Nl w/o focal abnormalities. Psyche - Mental status normal & appropriate.  No delusions, ideations or obvious mood abnormalities.  Skin - 2 suspicious lesions  (1)  4 mm x 4 mm medium & dark brown flat lesion on posterior L shoulder and (2) 3 mm x 4 mm dark brown to black flat lesion of mid / lateral L back.  Procedure: (1) 11300 - After informed consent and aseptic prep w/alcohol and local anesthesia with 0.5 ml Marcaine 0.5% w/epi - a #10 scalpel was used to excise by shave technique the lesion(# 1 above) to the SQ level. Then the wound base was deeply hyfrecated for hemostasis & electro destruction of an remnant lesions from the bx site.   Procedure: (2) 11300 - After informed consent and aseptic prep w/alcohol and local anesthesia with 0.5 ml Marcaine 0.5% w/epi - a #10 scalpel was used to excise by shave technique the lesion(# 2 above) to the SQ level. Then the wound base was deeply  hyfrecated for hemostasis & electro destruction of an remnant lesions from the bx site.   Both areas were applied antibiotic ointment and covered with a sterile 2" x 3" Tegaderm pad. Patient was instructed in wound care. Both lesions were sent for Path analysis     Assessment & Plan:   1. Essential hypertension   2. Atypical nevus of shoulder, left  - Dermatology pathology  3. 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

## 2015-05-05 NOTE — Progress Notes (Deleted)
Patient ID: Melvin Shelton, male   DOB: 09-30-1945, 69 y.o.   MRN: [id number]

## 2015-05-08 ENCOUNTER — Encounter: Payer: Self-pay | Admitting: Internal Medicine

## 2015-05-08 ENCOUNTER — Other Ambulatory Visit: Payer: Self-pay | Admitting: Internal Medicine

## 2015-05-08 DIAGNOSIS — E782 Mixed hyperlipidemia: Secondary | ICD-10-CM

## 2015-05-08 MED ORDER — FENOFIBRATE 160 MG PO TABS: 160.0000 mg | ORAL_TABLET | Freq: Every day | ORAL | Status: DC

## 2015-05-17 ENCOUNTER — Encounter: Payer: Self-pay | Admitting: Internal Medicine

## 2015-05-30 ENCOUNTER — Other Ambulatory Visit: Payer: Self-pay | Admitting: Internal Medicine

## 2015-06-09 ENCOUNTER — Ambulatory Visit (INDEPENDENT_AMBULATORY_CARE_PROVIDER_SITE_OTHER): Payer: Medicare Other | Admitting: Internal Medicine

## 2015-06-09 ENCOUNTER — Encounter: Payer: Self-pay | Admitting: Internal Medicine

## 2015-06-09 VITALS — BP 132/80 | HR 56 | Temp 97.5°F | Resp 16 | Ht 68.5 in | Wt 204.6 lb

## 2015-06-09 DIAGNOSIS — I10 Essential (primary) hypertension: Secondary | ICD-10-CM | POA: Diagnosis not present

## 2015-06-09 DIAGNOSIS — D235 Other benign neoplasm of skin of trunk: Secondary | ICD-10-CM | POA: Diagnosis not present

## 2015-06-09 DIAGNOSIS — D225 Melanocytic nevi of trunk: Secondary | ICD-10-CM

## 2015-06-10 NOTE — Progress Notes (Signed)
Subjective:    Patient ID: Melvin Shelton, male    DOB: 02-13-1946, 70 y.o.   MRN: [id number]  HPIThis nice 70 yo MWM with HTN & Psoriasis returns for recheck. Hypertensive sx's or HA, dizziness, CP, palpitations, Dyspnea & edema are negative. Patient has been on Humira for Psoriasis. He also presents for a wider excision of a recently bx'd dysplastic/atypical  Nevus of the L upper back([id number]). He also has several othe very dark brown to black small nevi noted of his back.   Medication Sig  . Adalimumab (HUMIRA PEN [place]) Inject into the skin. Injection every 2 weeks.  . ALPRAZolam (XANAX) 0.5 MG tablet TAKE 1 TABLET BY MOUTH THREE TIMES DAILY AS NEEDED  . amLODipine (NORVASC) 5 MG tablet TAKE 1 TABLET(5 MG) BY MOUTH DAILY  . amoxicillin-clavulanate (AUGMENTIN) 875-125 MG tablet TAKE 1 TABLET BY MOUTH TWICE DAILY UNTIL ALL TAKEN  . aspirin 81 MG tablet Take 81 mg by mouth daily.  . Calcium Carbonate (CALCIUM 600 PO) Take by mouth daily.  . Cholecalciferol (VITAMIN D3) 5000 UNITS TABS t tab  2 x day  . CLOBETASOL PROPIONATE E 0.05 % emollient cream APPLY TO THE AFFECTED AREA AS DIRECTED  . dexamethasone (DECADRON) 0.75 MG tablet TAKE 1/2 TO 1 TABLET BY MOUTH EVERY DAY  . fenofibrate 160 MG tablet Take 1 tablet (160 mg total) by mouth daily.  . Magnesium 500 MG CAPS Take 500 mg by mouth daily.  . Omega-3 Fatty Acids (FISH OIL) 1200 MG CAPS Take by mouth 3 (three) times daily.   No Known Allergies   Past Medical History  Diagnosis Date  . Hypertension   . Psoriatic arthritis ([place])   . Hyperlipidemia   . Hypogonadism male   . Osteoporosis   . Renal glycosuria ([place])   . Elevated LFTs   . Hx of colonic polyp    Review of Systems  10 point systems review negative except as above.    Objective:   Physical Exam   BP 132/80 mmHg  Pulse 56  Temp(Src) 97.5 F (36.4 C)  Resp 16  Ht 5' 8.5" (1.74 m)  Wt 204 lb 9.6 oz (92.806 kg)  BMI 30.65 kg/m2  HEENT - WNL Neck - supple.   Chest - Clear. Cor - RRR w/o sig MGR. MS- FROM w/o deformities. Gait Nl. Neuro - Nl w/o focal abnormalities. Skin - There is a (1) healing wound of the L upper back. Thre are 2 other flat dark brown to black lesions at the (2)  L lateral mid back and (3)  At the L  mid lateral flank.   Procedures (CPT: 11400, 11300, 11300) - After informed consent and aseptic prep the areas were sequentially prepped with alcohol and anesthetized with 1 ml, 0.5 ml & 0.5 ml sequentially. Then the 1st area was sharply excised with a #10 scalpel with a 2 mm margin in an eliptical fashion & then closed with # 3 sutures of proline 3-0. Next the 2sd & 3rd lesions were sharply excised by excisional shave technique and the wound bases deeply hyfrecated for hemostasis and electro destruction of any remnant lesions.  All 3 lesions were dressed with a 4" x 6" Tegaderm dressing. All #3 bx's were identified & sent for path analysis. Patient was instructed in wound care.     Assessment & Plan:   1. Essential hypertension   2. Atypical nevus of L back (x 2)   - Procedure 11400 - Procedure 11300  3.  Atypical nevus of L flank  - Procedure 11300  - ROV 8 -9 days for suture removal.

## 2015-06-12 DIAGNOSIS — L089 Local infection of the skin and subcutaneous tissue, unspecified: Secondary | ICD-10-CM | POA: Diagnosis not present

## 2015-06-12 DIAGNOSIS — D225 Melanocytic nevi of trunk: Secondary | ICD-10-CM | POA: Diagnosis not present

## 2015-06-12 NOTE — Addendum Note (Signed)
Addended by: Unk Pinto on: 06/12/2015 10:35 AM   Modules accepted: Orders

## 2015-06-13 ENCOUNTER — Encounter: Payer: Self-pay | Admitting: Internal Medicine

## 2015-06-13 ENCOUNTER — Other Ambulatory Visit: Payer: Self-pay | Admitting: Internal Medicine

## 2015-06-13 MED ORDER — CLOBETASOL PROP EMOLLIENT BASE 0.05 % EX CREA: TOPICAL_CREAM | CUTANEOUS | Status: DC

## 2015-06-14 ENCOUNTER — Other Ambulatory Visit: Payer: Self-pay | Admitting: Internal Medicine

## 2015-06-14 ENCOUNTER — Encounter: Payer: Self-pay | Admitting: Internal Medicine

## 2015-06-16 ENCOUNTER — Ambulatory Visit: Payer: Medicare Other | Admitting: Internal Medicine

## 2015-06-16 ENCOUNTER — Encounter: Payer: Self-pay | Admitting: Internal Medicine

## 2015-06-16 VITALS — BP 140/86 | HR 76 | Temp 98.2°F | Resp 16 | Ht 68.5 in

## 2015-06-16 DIAGNOSIS — D225 Melanocytic nevi of trunk: Secondary | ICD-10-CM

## 2015-06-16 NOTE — Progress Notes (Signed)
Patient ID: Melvin Shelton, male   DOB: 1946/01/20, 70 y.o.   MRN: [id number]   S/p re-excision of a dysplastic nevus with 2sd path showing no residual tumor. Sutures removed and w/o signs of infection

## 2015-06-21 ENCOUNTER — Encounter: Payer: Self-pay | Admitting: Internal Medicine

## 2015-07-03 ENCOUNTER — Other Ambulatory Visit: Payer: Self-pay | Admitting: *Deleted

## 2015-07-03 DIAGNOSIS — Z0001 Encounter for general adult medical examination with abnormal findings: Secondary | ICD-10-CM

## 2015-07-03 DIAGNOSIS — Z1212 Encounter for screening for malignant neoplasm of rectum: Secondary | ICD-10-CM

## 2015-07-03 LAB — POC HEMOCCULT BLD/STL (HOME/3-CARD/SCREEN)
Card #2 Fecal Occult Blod, POC: NEGATIVE
Card #3 Fecal Occult Blood, POC: NEGATIVE
FECAL OCCULT BLD: NEGATIVE

## 2015-07-04 ENCOUNTER — Other Ambulatory Visit: Payer: Self-pay | Admitting: Internal Medicine

## 2015-07-11 ENCOUNTER — Ambulatory Visit: Payer: Self-pay | Admitting: Internal Medicine

## 2015-07-19 ENCOUNTER — Ambulatory Visit (INDEPENDENT_AMBULATORY_CARE_PROVIDER_SITE_OTHER): Payer: Medicare Other | Admitting: Physician Assistant

## 2015-07-19 ENCOUNTER — Ambulatory Visit: Payer: Self-pay | Admitting: Internal Medicine

## 2015-07-19 ENCOUNTER — Encounter: Payer: Self-pay | Admitting: Physician Assistant

## 2015-07-19 ENCOUNTER — Other Ambulatory Visit: Payer: Self-pay

## 2015-07-19 VITALS — BP 130/80 | HR 76 | Temp 97.3°F | Resp 16 | Ht 68.5 in | Wt 208.6 lb

## 2015-07-19 DIAGNOSIS — Z79899 Other long term (current) drug therapy: Secondary | ICD-10-CM | POA: Diagnosis not present

## 2015-07-19 DIAGNOSIS — Z860100 Personal history of colon polyps, unspecified: Secondary | ICD-10-CM

## 2015-07-19 DIAGNOSIS — Z6831 Body mass index (BMI) 31.0-31.9, adult: Secondary | ICD-10-CM | POA: Diagnosis not present

## 2015-07-19 DIAGNOSIS — Z111 Encounter for screening for respiratory tuberculosis: Secondary | ICD-10-CM

## 2015-07-19 DIAGNOSIS — I10 Essential (primary) hypertension: Secondary | ICD-10-CM | POA: Diagnosis not present

## 2015-07-19 DIAGNOSIS — R6889 Other general symptoms and signs: Secondary | ICD-10-CM | POA: Diagnosis not present

## 2015-07-19 DIAGNOSIS — L4050 Arthropathic psoriasis, unspecified: Secondary | ICD-10-CM

## 2015-07-19 DIAGNOSIS — E559 Vitamin D deficiency, unspecified: Secondary | ICD-10-CM

## 2015-07-19 DIAGNOSIS — E785 Hyperlipidemia, unspecified: Secondary | ICD-10-CM

## 2015-07-19 DIAGNOSIS — E119 Type 2 diabetes mellitus without complications: Secondary | ICD-10-CM | POA: Diagnosis not present

## 2015-07-19 DIAGNOSIS — M810 Age-related osteoporosis without current pathological fracture: Secondary | ICD-10-CM | POA: Diagnosis not present

## 2015-07-19 DIAGNOSIS — R7989 Other specified abnormal findings of blood chemistry: Secondary | ICD-10-CM | POA: Diagnosis not present

## 2015-07-19 DIAGNOSIS — E349 Endocrine disorder, unspecified: Secondary | ICD-10-CM

## 2015-07-19 DIAGNOSIS — D235 Other benign neoplasm of skin of trunk: Secondary | ICD-10-CM

## 2015-07-19 DIAGNOSIS — Z0000 Encounter for general adult medical examination without abnormal findings: Secondary | ICD-10-CM

## 2015-07-19 DIAGNOSIS — E291 Testicular hypofunction: Secondary | ICD-10-CM | POA: Diagnosis not present

## 2015-07-19 DIAGNOSIS — Z0001 Encounter for general adult medical examination with abnormal findings: Secondary | ICD-10-CM

## 2015-07-19 DIAGNOSIS — E669 Obesity, unspecified: Secondary | ICD-10-CM | POA: Diagnosis not present

## 2015-07-19 DIAGNOSIS — D225 Melanocytic nevi of trunk: Secondary | ICD-10-CM

## 2015-07-19 DIAGNOSIS — R945 Abnormal results of liver function studies: Secondary | ICD-10-CM

## 2015-07-19 NOTE — Patient Instructions (Addendum)
ewan dobson- check it out  Preventive Care for Adults A healthy lifestyle and preventive care can promote health and wellness. Preventive health guidelines for men include the following key practices:  A routine yearly physical is a good way to check with your health care provider about your health and preventative screening. It is a chance to share any concerns and updates on your health and to receive a thorough exam.  Visit your dentist for a routine exam and preventative care every 6 months. Brush your teeth twice a day and floss once a day. Good oral hygiene prevents tooth decay and gum disease.  The frequency of eye exams is based on your age, health, family medical history, use of contact lenses, and other factors. Follow your health care provider's recommendations for frequency of eye exams.  Eat a healthy diet. Foods such as vegetables, fruits, whole grains, low-fat dairy products, and lean protein foods contain the nutrients you need without too many calories. Decrease your intake of foods high in solid fats, added sugars, and salt. Eat the right amount of calories for you.Get information about a proper diet from your health care provider, if necessary.  Regular physical exercise is one of the most important things you can do for your health. Most adults should get at least 150 minutes of moderate-intensity exercise (any activity that increases your heart rate and causes you to sweat) each week. In addition, most adults need muscle-strengthening exercises on 2 or more days a week.  Maintain a healthy weight. The body mass index (BMI) is a screening tool to identify possible weight problems. It provides an estimate of body fat based on height and weight. Your health care provider can find your BMI and can help you achieve or maintain a healthy weight.For adults 20 years and older:  A BMI below 18.5 is considered underweight.  A BMI of 18.5 to 24.9 is normal.  A BMI of 25 to 29.9 is  considered overweight.  A BMI of 30 and above is considered obese.  Maintain normal blood lipids and cholesterol levels by exercising and minimizing your intake of saturated fat. Eat a balanced diet with plenty of fruit and vegetables. Blood tests for lipids and cholesterol should begin at age 28 and be repeated every 5 years. If your lipid or cholesterol levels are high, you are over 50, or you are at high risk for heart disease, you may need your cholesterol levels checked more frequently.Ongoing high lipid and cholesterol levels should be treated with medicines if diet and exercise are not working.  If you smoke, find out from your health care provider how to quit. If you do not use tobacco, do not start.  Lung cancer screening is recommended for adults aged 76-80 years who are at high risk for developing lung cancer because of a history of smoking. A yearly low-dose CT scan of the lungs is recommended for people who have at least a 30-pack-year history of smoking and are a current smoker or have quit within the past 15 years. A pack year of smoking is smoking an average of 1 pack of cigarettes a day for 1 year (for example: 1 pack a day for 30 years or 2 packs a day for 15 years). Yearly screening should continue until the smoker has stopped smoking for at least 15 years. Yearly screening should be stopped for people who develop a health problem that would prevent them from having lung cancer treatment.  If you choose to drink  alcohol, do not have more than 2 drinks per day. One drink is considered to be 12 ounces (355 mL) of beer, 5 ounces (148 mL) of wine, or 1.5 ounces (44 mL) of liquor.  Avoid use of street drugs. Do not share needles with anyone. Ask for help if you need support or instructions about stopping the use of drugs.  High blood pressure causes heart disease and increases the risk of stroke. Your blood pressure should be checked at least every 1-2 years. Ongoing high blood pressure  should be treated with medicines, if weight loss and exercise are not effective.  If you are 45-37 years old, ask your health care provider if you should take aspirin to prevent heart disease.  Diabetes screening involves taking a blood sample to check your fasting blood sugar level. Testing should be considered at a younger age or be carried out more frequently if you are overweight and have at least 1 risk factor for diabetes.  Colorectal cancer can be detected and often prevented. Most routine colorectal cancer screening begins at the age of 38 and continues through age 66. However, your health care provider may recommend screening at an earlier age if you have risk factors for colon cancer. On a yearly basis, your health care provider may provide home test kits to check for hidden blood in the stool. Use of a small camera at the end of a tube to directly examine the colon (sigmoidoscopy or colonoscopy) can detect the earliest forms of colorectal cancer. Talk to your health care provider about this at age 62, when routine screening begins. Direct exam of the colon should be repeated every 5-10 years through age 26, unless early forms of precancerous polyps or small growths are found.  Hepatitis C blood testing is recommended for all people born from [birth year] through 1963 and any individual with known risks for hepatitis C.  New guidelines recommend a once time screening for HIV.   Screening for abdominal aortic aneurysm (AAA)  by ultrasound is recommended for people who have history of high blood pressure or who are current or former smokers.  Healthy men should  receive prostate-specific antigen (PSA) blood tests as part of routine cancer screening. Talk with your health care provider about prostate cancer screening.  Testicular cancer screening is  recommended for adult males. Screening includes self-exam, a health care provider exam, and other screening tests. Consult with your health care provider  about any symptoms you have or any concerns you have about testicular cancer.  Use sunscreen. Apply sunscreen liberally and repeatedly throughout the day. You should seek shade when your shadow is shorter than you. Protect yourself by wearing long sleeves, pants, a wide-brimmed hat, and sunglasses year round, whenever you are outdoors.  Once a month, do a whole-body skin exam, using a mirror to look at the skin on your back. Tell your health care provider about new moles, moles that have irregular borders, moles that are larger than a pencil eraser, or moles that have changed in shape or color.  Stay current with required vaccines (immunizations).  Influenza vaccine. All adults should be immunized every year.  Tetanus, diphtheria, and acellular pertussis (Td, Tdap) vaccine. An adult who has not previously received Tdap or who does not know his vaccine status should receive 1 dose of Tdap. This initial dose should be followed by tetanus and diphtheria toxoids (Td) booster doses every 10 years. Adults with an unknown or incomplete history of completing a 3-dose immunization series  with Td-containing vaccines should begin or complete a primary immunization series including a Tdap dose. Adults should receive a Td booster every 10 years.  Zoster vaccine. One dose is recommended for adults aged 49 years or older unless certain conditions are present.    PREVNAR - Pneumococcal 13-valent conjugate (PCV13) vaccine. When indicated, a person who is uncertain of his immunization history and has no record of immunization should receive the PCV13 vaccine. An adult aged 33 years or older who has certain medical conditions and has not been previously immunized should receive 1 dose of PCV13 vaccine. This PCV13 should be followed with a dose of pneumococcal polysaccharide (PPSV23) vaccine. The PPSV23 vaccine dose should be obtained at least 8 weeks after the dose of PCV13 vaccine. An adult aged 53 years or older who  has certain medical conditions and previously received 1 or more doses of PPSV23 vaccine should receive 1 dose of PCV13. The PCV13 vaccine dose should be obtained 1 or more years after the last PPSV23 vaccine dose.    PNEUMOVAX - Pneumococcal polysaccharide (PPSV23) vaccine. When PCV13 is also indicated, PCV13 should be obtained first. All adults aged 5 years and older should be immunized. An adult younger than age 24 years who has certain medical conditions should be immunized. Any person who resides in a nursing home or long-term care facility should be immunized. An adult smoker should be immunized. People with an immunocompromised condition and certain other conditions should receive both PCV13 and PPSV23 vaccines. People with human immunodeficiency virus (HIV) infection should be immunized as soon as possible after diagnosis. Immunization during chemotherapy or radiation therapy should be avoided. Routine use of PPSV23 vaccine is not recommended for American Indians, [place] Natives, or people younger than 65 years unless there are medical conditions that require PPSV23 vaccine. When indicated, people who have unknown immunization and have no record of immunization should receive PPSV23 vaccine. One-time revaccination 5 years after the first dose of PPSV23 is recommended for people aged 19-64 years who have chronic kidney failure, nephrotic syndrome, asplenia, or immunocompromised conditions. People who received 1-2 doses of PPSV23 before age 71 years should receive another dose of PPSV23 vaccine at age 59 years or later if at least 5 years have passed since the previous dose. Doses of PPSV23 are not needed for people immunized with PPSV23 at or after age 28 years.    Hepatitis A vaccine. Adults who wish to be protected from this disease, have certain high-risk conditions, work with hepatitis A-infected animals, work in hepatitis A research labs, or travel to or work in countries with a high rate of  hepatitis A should be immunized. Adults who were previously unvaccinated and who anticipate close contact with an international adoptee during the first 60 days after arrival in the [place] from a country with a high rate of hepatitis A should be immunized.    Hepatitis B vaccine. Adults should be immunized if they wish to be protected from this disease, have certain high-risk conditions, may be exposed to blood or other infectious body fluids, are household contacts or sex partners of hepatitis B positive people, are clients or workers in certain care facilities, or travel to or work in countries with a high rate of hepatitis B.   Preventive Service / Frequency   Ages 31 and over  Blood pressure check.  Lipid and cholesterol check.  Lung cancer screening. / Every year if you are aged 30-80 years and have a 30-pack-year history of smoking  and currently smoke or have quit within the past 15 years. Yearly screening is stopped once you have quit smoking for at least 15 years or develop a health problem that would prevent you from having lung cancer treatment.  Fecal occult blood test (FOBT) of stool. You may not have to do this test if you get a colonoscopy every 10 years.  Flexible sigmoidoscopy** or colonoscopy.** / Every 5 years for a flexible sigmoidoscopy or every 10 years for a colonoscopy beginning at age 47 and continuing until age 93.  Hepatitis C blood test.** / For all people born from [birth year] through 1963 and any individual with known risks for hepatitis C.  Abdominal aortic aneurysm (AAA) screening./ Screening current or former smokers or have Hypertension.  Skin self-exam. / Monthly.  Influenza vaccine. / Every year.  Tetanus, diphtheria, and acellular pertussis (Tdap/Td) vaccine.** / 1 dose of Td every 10 years.   Zoster vaccine.** / 1 dose for adults aged 46 years or older.         Pneumococcal 13-valent conjugate (PCV13) vaccine.    Pneumococcal polysaccharide  (PPSV23) vaccine.     Hepatitis A vaccine.** / Consult your health care provider.  Hepatitis B vaccine.** / Consult your health care provider. Screening for abdominal aortic aneurysm (AAA)  by ultrasound is recommended for people who have history of high blood pressure or who are current or former smokers.

## 2015-07-19 NOTE — Progress Notes (Signed)
MEDICARE ANNUAL WELLNESS VISIT AND FOLLOW UP Assessment:   1. Essential hypertension - continue medications, DASH diet, exercise and monitor at home. Call if greater than 130/80.  - CBC with Differential/Platelet - BASIC METABOLIC PANEL WITH GFR - Hepatic function panel - TSH  2. Hyperlipidemia -continue medications, check lipids, decrease fatty foods, increase activity.  - Lipid panel  3. Type 2 diabetes mellitus without complication - Hemoglobin A1c - HM DIABETES FOOT EXAM  4. Psoriatic arthritis Back on humaria, needs TB test, will send to Dr. Everardo All, # [phone]  5. Osteoporosis Check DEXA  6. Testosterone deficiency Natural ways to increase testosterone  7. Elevated LFTs Check LFTs, weight loss advised  8. Hx of colonic polyp Due 2018  9. Medication management - Magnesium  10. Vitamin D deficiency - Vit D  25 hydroxy (rtn osteoporosis monitoring)  11. Obesity Obesity with co morbidities- long discussion about weight loss, diet, and exercise  12. Atypical nevus Will follow up  Plan:   During the course of the visit the patient was educated and counseled about appropriate screening and preventive services including:    Pneumococcal vaccine   Influenza vaccine  Td vaccine  Screening electrocardiogram  Colorectal cancer screening  Diabetes screening  Glaucoma screening  Nutrition counseling   Conditions/risks identified: BMI: Discussed weight loss, diet, and increase physical activity.  Increase physical activity: AHA recommends 150 minutes of physical activity a week.  Medications reviewed Diabetes is at goal, ACE/ARB therapy: No, Reason not on Ace Inhibitor/ARB therapy:  declines Urinary Incontinence is not an issue: discussed non pharmacology and pharmacology options.  Fall risk: low- discussed PT, home fall assessment, medications.    Subjective:  Melvin REIERSON is a 70 y.o. male who presents for Medicare Annual  Wellness Visit and 3 month follow up for HTN, hyperlipidemia, prediabetes, and vitamin D Def.  Date of last medicare wellness visit was 06/2014  His blood pressure has been controlled at home, he is on norvasc 10, today their BP is BP: 130/80 mmHg He does not workout reguarly but walks some, and trying to get 10,000 steps a day. He denies chest pain, shortness of breath, dizziness.  He is not on cholesterol medication, he was taken off lipitor.  His cholesterol is not at goal. The cholesterol last visit was:   Lab Results  Component Value Date   CHOL 232* 03/22/2015   HDL 49 03/22/2015   LDLCALC 138* 03/22/2015   TRIG 223* 03/22/2015   CHOLHDL 4.7 03/22/2015  He has cutaneous psoriasis and has psoriatic arthritis affecting his hands. He follows with Dr. Lyman Speller and has been on a DMARD in the past but is not on it due to cost but he is trying to get on humaria patient assitance. 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   Has history of osteoporosis last DEXA was 2010.  Has history of elevated liver function test, had normal [place] AB 2015, Lab Results  Component Value Date   ALT 93* 03/22/2015   AST 56* 03/22/2015   ALKPHOS 35* 03/22/2015   BILITOT 1.0 03/22/2015  He has been working on diet and exercise for diabetes A1C 09/2013 was 6.6, he has worked very hard to lower A1C, now in preDM range, he is on bASA, he is not on ACE/ARB, and denies polyuria and visual disturbances. Last A1C in the office was:  Lab Results  Component Value Date   HGBA1C 6.0* 03/22/2015  Patient is  on Vitamin D supplement.   Lab Results  Component Value Date   VD25OH 58 03/22/2015  BMI is Body mass index is 31.25 kg/(m^2)., he is working on diet and exercise. He admits that he was on tour recently  Wt Readings from Last 3 Encounters:  07/19/15 208 lb 9.6 oz (94.62 kg)  06/09/15 204 lb 9.6 oz (92.806 kg)  05/05/15 202 lb 9.6 oz (91.899 kg)   Names of Other  Physician/Practitioners you currently use: 1. [facility] here for primary care 2. [facility], eye doctor, last visit 2013 3. Dr. Archie Balboa, dentist, last visit 2013 Patient Care Team: Unk Pinto, MD as PCP - General (Internal Medicine) Inda Castle, MD as Consulting Physician (Gastroenterology) Orville Govern, MD as Consulting Physician  Medication Review: Current Outpatient Prescriptions on File Prior to Visit  Medication Sig Dispense Refill  . Adalimumab (HUMIRA PEN [place]) Inject into the skin. Injection every 2 weeks.    . ALPRAZolam (XANAX) 0.5 MG tablet TAKE 1 TABLET BY MOUTH THREE TIMES DAILY AS NEEDED 90 tablet 5  . amLODipine (NORVASC) 5 MG tablet TAKE 1 TABLET(5 MG) BY MOUTH DAILY 90 tablet 1  . aspirin 81 MG tablet Take 81 mg by mouth daily.    . Calcium Carbonate (CALCIUM 600 PO) Take by mouth daily.    . Cholecalciferol (VITAMIN D3) 5000 UNITS TABS t tab  2 x day    . Clobetasol Prop Emollient Base (CLOBETASOL PROPIONATE E) 0.05 % emollient cream APPLY TO THE AFFECTED AREA AS DIRECTED 180 g 99  . dexamethasone (DECADRON) 0.75 MG tablet TAKE 1/2 TO 1 TABLET BY MOUTH EVERY DAY 90 tablet 1  . fenofibrate 160 MG tablet Take 1 tablet (160 mg total) by mouth daily. 90 tablet 1  . Magnesium 500 MG CAPS Take 500 mg by mouth daily.    . Omega-3 Fatty Acids (FISH OIL) 1200 MG CAPS Take by mouth 3 (three) times daily.    Marland Kitchen amoxicillin-clavulanate (AUGMENTIN) 875-125 MG tablet TAKE 1 TABLET BY MOUTH TWICE DAILY UNTIL ALL TAKEN (Patient not taking: Reported on 07/19/2015) 20 tablet 2  . penicillin v potassium (VEETID) 500 MG tablet TK 1 T PO QID (Patient not taking: Reported on 07/19/2015) 100 tablet 1   No current facility-administered medications on file prior to visit.    Current Problems (verified) Patient Active Problem List   Diagnosis Date Noted  . Diabetes mellitus without complication ([place]) 81/82/9937  . BMI 31.56,  adult 03/22/2015   . Obesity 06/28/2014  . Medication management 03/16/2014  . Vitamin D deficiency 03/16/2014  . Hypertension   . Psoriatic arthritis ([place])   . Hyperlipidemia   . Testosterone deficiency   . Osteoporosis   . Elevated LFTs   . Hx of colonic polyp     Screening Tests Health Maintenance  Topic Date Due  . OPHTHALMOLOGY EXAM  10/09/1957  . TETANUS/TDAP  10/10/1966  . ZOSTAVAX  10/10/2007  . PNA vac Low Risk Adult (1 of 2 - PCV13) 10/09/2012  . INFLUENZA VACCINE  01/02/2015  . HEMOGLOBIN A1C  09/20/2015  . FOOT EXAM  03/21/2016  . URINE MICROALBUMIN  03/21/2016  . COLONOSCOPY  02/14/2020  . Hepatitis C Screening  Completed    Immunization History  Administered Date(s) Administered  . DT 03/17/2014  . Pneumococcal-Unspecified 06/04/2003   Preventative care: Last colonoscopy: 02/2010 due 2018 DEXA 2010 DUE  [place] 11/2013 gall stone, no fatty liver  Prior vaccinations: TD or Tdap:  2015  Influenza: declines  Pneumococcal: 2005 Prevnar13: declines Shingles/Zostavax: declines    Medication List       This list is accurate as of: 07/19/15  3:38 PM.  Always use your most recent med list.               ALPRAZolam 0.5 MG tablet  Commonly known as:  XANAX  TAKE 1 TABLET BY MOUTH THREE TIMES DAILY AS NEEDED     amLODipine 5 MG tablet  Commonly known as:  NORVASC  TAKE 1 TABLET(5 MG) BY MOUTH DAILY     amoxicillin-clavulanate 875-125 MG tablet  Commonly known as:  AUGMENTIN  TAKE 1 TABLET BY MOUTH TWICE DAILY UNTIL ALL TAKEN     aspirin 81 MG tablet  Take 81 mg by mouth daily.     CALCIUM 600 PO  Take by mouth daily.     Clobetasol Prop Emollient Base 0.05 % emollient cream  Commonly known as:  CLOBETASOL PROPIONATE E  APPLY TO THE AFFECTED AREA AS DIRECTED     dexamethasone 0.75 MG tablet  Commonly known as:  DECADRON  TAKE 1/2 TO 1 TABLET BY MOUTH EVERY DAY     ENBREL SURECLICK 50 MG/ML injection  Generic drug:  etanercept  Inject into the skin.      fenofibrate 160 MG tablet  Take 1 tablet (160 mg total) by mouth daily.     fenofibrate 145 MG tablet  Commonly known as:  TRICOR  TK 1 T PO D     Fish Oil 1200 MG Caps  Take by mouth 3 (three) times daily.     HUMIRA PEN [place]  Inject into the skin. Injection every 2 weeks.     Magnesium 500 MG Caps  Take 500 mg by mouth daily.     penicillin v potassium 500 MG tablet  Commonly known as:  VEETID  TK 1 T PO QID     Vitamin D3 5000 units Tabs  t tab  2 x day        No past surgical history on file. Family History  Problem Relation Age of Onset  . Cancer Mother     lymphoma  . Heart disease Father   . Hypertension Father   . Diabetes Father   . Cancer Father     prostate   Risk Factors: Tobacco Social History  Substance Use Topics  . Smoking status: Never Smoker   . Smokeless tobacco: None  . Alcohol Use: Yes     Comment: ocassional   He does not smoke.  Patient is not a former smoker. Are there smokers in your home (other than you)?  No  Alcohol Current alcohol use: none  Caffeine Current caffeine use: coffee 1-3 /day  Exercise Current exercise: walking  Nutrition/Diet Current diet: in general, a "healthy" diet    Cardiac risk factors: advanced age (older than 57 for men, 36 for women), diabetes mellitus, dyslipidemia, family history of premature cardiovascular disease, hypertension, male gender, obesity (BMI >= 30 kg/m2), sedentary lifestyle and smoking/ tobacco exposure.  Depression Screen (Note: if answer to either of the following is "Yes", a more complete depression screening is indicated)   Q1: Over the past two weeks, have you felt down, depressed or hopeless? No  Q2: Over the past two weeks, have you felt little interest or pleasure in doing things? No  Have you lost interest or pleasure in daily life? No  Do you often feel hopeless? No  Do you  cry easily over simple problems? No  Activities of Daily Living In your present state of health,  do you have any difficulty performing the following activities?:  Driving? No Managing money?  No Feeding yourself? No Getting from bed to chair? No Climbing a flight of stairs? No Preparing food and eating?: No Bathing or showering? No Getting dressed: No Getting to the toilet? No Using the toilet:No Moving around from place to place: No In the past year have you fallen or had a near fall?:No  Vision Difficulties: No  Hearing Difficulties: No Do you often ask people to speak up or repeat themselves? No Do you experience ringing or noises in your ears? No Do you have difficulty understanding soft or whispered voices? No  Cognition  Do you feel that you have a problem with memory?No  Do you often misplace items? No  Do you feel safe at home?  Yes  Advanced directives Does patient have a [place]? No Does patient have a Living Will? No   Objective:   Blood pressure 130/80, pulse 76, temperature 97.3 F (36.3 C), temperature source Temporal, resp. rate 16, height 5' 8.5" (1.74 m), weight 208 lb 9.6 oz (94.62 kg), SpO2 95 %. Body mass index is 31.25 kg/(m^2).  General appearance: alert, no distress, WD/WN, male Cognitive Testing  Alert? Yes  Normal Appearance?Yes  Oriented to person? Yes  Place? Yes   Time? Yes  Recall of three objects?  Yes  Can perform simple calculations? Yes  Displays appropriate judgment?Yes  Can read the correct time from a watch face?Yes HEENT: normocephalic, sclerae anicteric, TMs pearly, nares patent, no discharge or erythema, pharynx normal Oral cavity: MMM, no lesions Neck: supple, no lymphadenopathy, no thyromegaly, no masses Heart: RRR, normal S1, S2, + high pitched early systolic murmur LSB Lungs: CTA bilaterally, no wheezes, rhonchi, or rales Abdomen: +bs, soft, non tender, non distended, no masses, no hepatomegaly, no splenomegaly Musculoskeletal: nontender, no swelling, no obvious deformity Extremities: no edema,  no cyanosis, no clubbing Pulses: 2+ symmetric, upper and lower extremities, normal cap refill Neurological: alert, oriented x 3, CN2-12 intact, strength normal upper extremities and lower extremities, sensation normal throughout, DTRs 2+ throughout, no cerebellar signs, gait normal Psychiatric: normal affect, behavior normal, pleasant   Medicare Attestation I have personally reviewed: The patient's medical and social history Their use of alcohol, tobacco or illicit drugs Their current medications and supplements The patient's functional ability including ADLs,fall risks, home safety risks, cognitive, and hearing and visual impairment Diet and physical activities Evidence for depression or mood disorders  The patient's weight, height, BMI, and visual acuity have been recorded in the chart.  I have made referrals, counseling, and provided education to the patient based on review of the above and I have provided the patient with a written personalized care plan for preventive services.     Vicie Mutters, PA-C   07/19/2015

## 2015-07-20 LAB — CBC WITH DIFFERENTIAL/PLATELET
BASOS ABS: 0 10*3/uL (ref 0.0–0.1)
BASOS PCT: 0 % (ref 0–1)
EOS ABS: 0.1 10*3/uL (ref 0.0–0.7)
Eosinophils Relative: 2 % (ref 0–5)
HCT: 49.8 % (ref 39.0–52.0)
Hemoglobin: 18.2 g/dL — ABNORMAL HIGH (ref 13.0–17.0)
Lymphocytes Relative: 39 % (ref 12–46)
Lymphs Abs: 2.7 10*3/uL (ref 0.7–4.0)
MCH: 33.8 pg (ref 26.0–34.0)
MCHC: 36.5 g/dL — ABNORMAL HIGH (ref 30.0–36.0)
MCV: 92.6 fL (ref 78.0–100.0)
MPV: 10.9 fL (ref 8.6–12.4)
Monocytes Absolute: 0.7 10*3/uL (ref 0.1–1.0)
Monocytes Relative: 10 % (ref 3–12)
NEUTROS PCT: 49 % (ref 43–77)
Neutro Abs: 3.3 10*3/uL (ref 1.7–7.7)
PLATELETS: 244 10*3/uL (ref 150–400)
RBC: 5.38 MIL/uL (ref 4.22–5.81)
RDW: 14.1 % (ref 11.5–15.5)
WBC: 6.8 10*3/uL (ref 4.0–10.5)

## 2015-07-20 LAB — HEPATIC FUNCTION PANEL
ALT: 61 U/L — ABNORMAL HIGH (ref 9–46)
AST: 41 U/L — ABNORMAL HIGH (ref 10–35)
Albumin: 4.2 g/dL (ref 3.6–5.1)
Alkaline Phosphatase: 44 U/L (ref 40–115)
BILIRUBIN DIRECT: 0.2 mg/dL (ref <=0.2)
Indirect Bilirubin: 0.5 mg/dL (ref 0.2–1.2)
Total Bilirubin: 0.7 mg/dL (ref 0.2–1.2)
Total Protein: 7.3 g/dL (ref 6.1–8.1)

## 2015-07-20 LAB — BASIC METABOLIC PANEL WITH GFR
BUN: 13 mg/dL (ref 7–25)
CHLORIDE: 104 mmol/L (ref 98–110)
CO2: 25 mmol/L (ref 20–31)
CREATININE: 0.89 mg/dL (ref 0.70–1.25)
Calcium: 9.5 mg/dL (ref 8.6–10.3)
GFR, Est Non African American: 88 mL/min (ref >=60)
Glucose, Bld: 128 mg/dL — ABNORMAL HIGH (ref 65–99)
POTASSIUM: 4.1 mmol/L (ref 3.5–5.3)
SODIUM: 140 mmol/L (ref 135–146)

## 2015-07-20 LAB — LIPID PANEL
CHOL/HDL RATIO: 6.2 ratio — AB (ref <=5.0)
Cholesterol: 223 mg/dL — ABNORMAL HIGH (ref 125–200)
HDL: 36 mg/dL — ABNORMAL LOW (ref >=40)
LDL Cholesterol: 110 mg/dL (ref <130)
Triglycerides: 384 mg/dL — ABNORMAL HIGH (ref <150)
VLDL: 77 mg/dL — AB (ref <30)

## 2015-07-20 LAB — TSH: TSH: 0.8 m[IU]/L (ref 0.40–4.50)

## 2015-07-20 LAB — MAGNESIUM: Magnesium: 2.2 mg/dL (ref 1.5–2.5)

## 2015-07-20 LAB — HEMOGLOBIN A1C
HEMOGLOBIN A1C: 5.7 % — AB (ref <5.7)
MEAN PLASMA GLUCOSE: 117 mg/dL — AB (ref <117)

## 2015-07-20 LAB — VITAMIN D 25 HYDROXY (VIT D DEFICIENCY, FRACTURES): Vit D, 25-Hydroxy: 64 ng/mL (ref 30–100)

## 2015-07-21 LAB — TB SKIN TEST
Induration: 0 mm
TB SKIN TEST: NEGATIVE

## 2015-07-27 ENCOUNTER — Other Ambulatory Visit: Payer: Self-pay | Admitting: *Deleted

## 2015-07-27 ENCOUNTER — Encounter: Payer: Self-pay | Admitting: Internal Medicine

## 2015-07-27 DIAGNOSIS — E782 Mixed hyperlipidemia: Secondary | ICD-10-CM

## 2015-07-27 MED ORDER — FENOFIBRATE 160 MG PO TABS: 160.0000 mg | ORAL_TABLET | Freq: Every day | ORAL | Status: DC

## 2015-08-01 ENCOUNTER — Encounter: Payer: Self-pay | Admitting: Internal Medicine

## 2015-08-01 ENCOUNTER — Ambulatory Visit (INDEPENDENT_AMBULATORY_CARE_PROVIDER_SITE_OTHER): Payer: Medicare Other | Admitting: Internal Medicine

## 2015-08-01 VITALS — BP 138/80 | HR 84 | Temp 97.5°F | Resp 16 | Ht 68.0 in | Wt 204.4 lb

## 2015-08-01 DIAGNOSIS — D485 Neoplasm of uncertain behavior of skin: Secondary | ICD-10-CM

## 2015-08-01 DIAGNOSIS — I10 Essential (primary) hypertension: Secondary | ICD-10-CM

## 2015-08-01 NOTE — Progress Notes (Signed)
  Subjective:    Patient ID: Melvin Shelton, male    DOB: 29-Aug-1945, 70 y.o.   MRN: [id number]  HPI  This very nice 70 yo MWM with hx/o HTN and on Enbrel for psoriasis has recently had excision of several dysplastic nevi has been noted to have multiple very dark brown to black nevi scattered over the upper trunk and UE's  Returns for cryo destruction of the suspect lesions. CV review for hypertensive sx's as HA, dizziness, CP, palpitations, dyspnea, or edema is negative.   Medication Sig  . (HUMIRA PEN) Inject into the skin. Injection every 2 weeks.  . ALPRAZolam  0.5 MG tablet TAKE 1 TABLET BY MOUTH THREE TIMES DAILY AS NEEDED  . amLODipine  5 MG tablet TAKE 1 TABLET(5 MG) BY MOUTH DAILY  . aspirin 81 MG tablet Take 81 mg by mouth daily.  Marland Kitchen CALCIUM 600 Take by mouth daily.  Marland Kitchen VITAMIN D3 5000 UNITS TABS t tab  2 x day  . CLOBETASOL PROPIONATE E 0.05 %  crm APPLY TO THE AFFECTED AREA AS DIRECTED  . dexamethasone  0.75 MG tablet TAKE 1/2 TO 1 TABLET BY MOUTH EVERY DAY  . (ENBREL) 50 MG/ML injection Inject into the skin.  . fenofibrate 160 MG tablet Take 1 tablet (160 mg total) by mouth daily.  . Magnesium 500 MG CAPS Take 500 mg by mouth daily.  . Omega-3 FISH OIL 1200 MG CAPS Take by mouth 3 (three) times daily.  Marland Kitchen amoxicillin-clavulanate  875-125 MG  TAKE 1 TABLET BY MOUTH TWICE DAILY UNTIL ALL TAKEN (Patient not taking: Reported on 07/19/2015)  . penicillin v potassium  500 MG  TK 1 T PO QID (Patient not taking: Reported on 07/19/2015)   No Known Allergies   Past Medical History  Diagnosis Date  . Hypertension   . Psoriatic arthritis ([place])   . Hyperlipidemia   . Hypogonadism male   . Osteoporosis   . Renal glycosuria ([place])   . Elevated LFTs   . Hx of colonic polyp    No past surgical history on file.  Review of Systems   10 point systems review negative except as above.    Objective:   Physical Exam  BP 138/80 mmHg  Pulse 84  Temp(Src) 97.5 F (36.4 C) (Temporal)  Resp  16  Ht 5' 8"  (1.727 m)  Wt 204 lb 6.4 oz (92.715 kg)  BMI 31.09 kg/m2  SpO2 96%    Procedure: ( CPT- 17000 & 17003 x 5) -  Patient has multiple flat very dark brown to black lesions varying in size from 2 mm to 3-4 mm scattered over the anterior / posterior trunk and upper extremities - all treated with liq Nitrogen by double & triple freeze-thaw technique for cryo destruction. (# 15 lesions treated)     Patient apprised of time course of blistering/sloughong of lesions and of wound care.     Assessment & Plan:   1. Essential hypertension   2. Neoplasm of uncertain behavior of skin  - Cryo Therapy of multiple suspect skin lesions.   - Recommended ROV 6-8 weeks for re-evaluation

## 2015-10-14 IMAGING — US US ABDOMEN COMPLETE
1 series · 14 of 25 positions shown · non-contrast
Comparison: None

CLINICAL DATA: Elevated LFTs

EXAM:
ULTRASOUND ABDOMEN COMPLETE

[Series 1: us abdomen complete · 0.29mm/px · 14 of 95 slices shown]
[im 1/95]
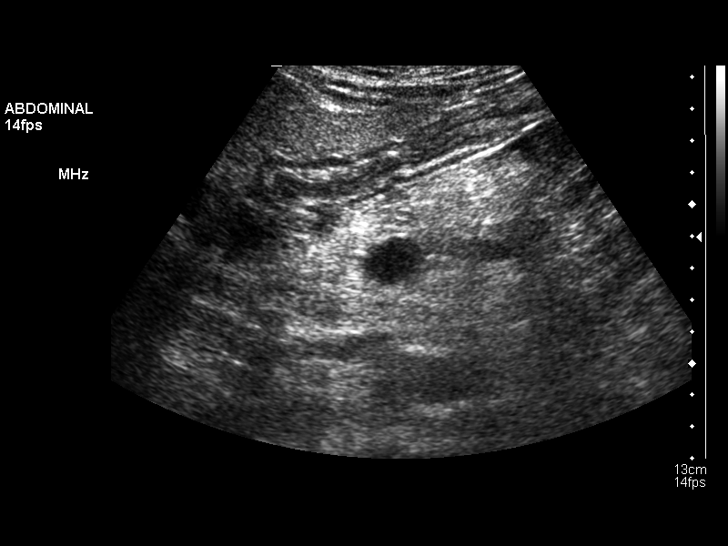
[im 8/95]
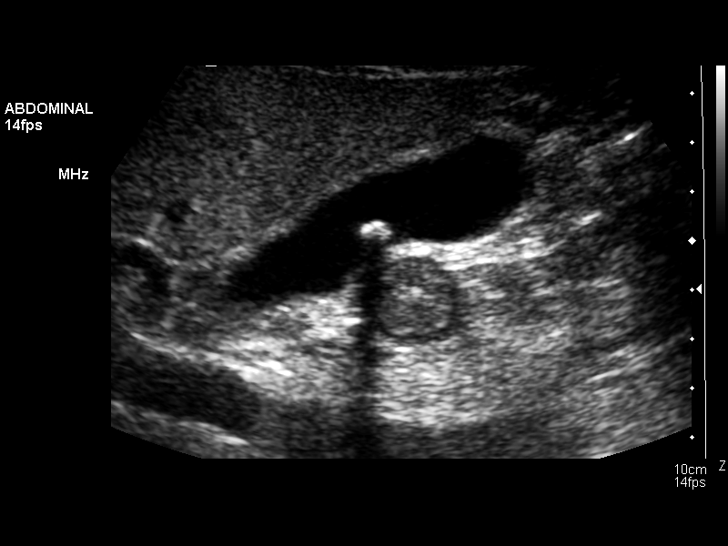
[im 16/95]
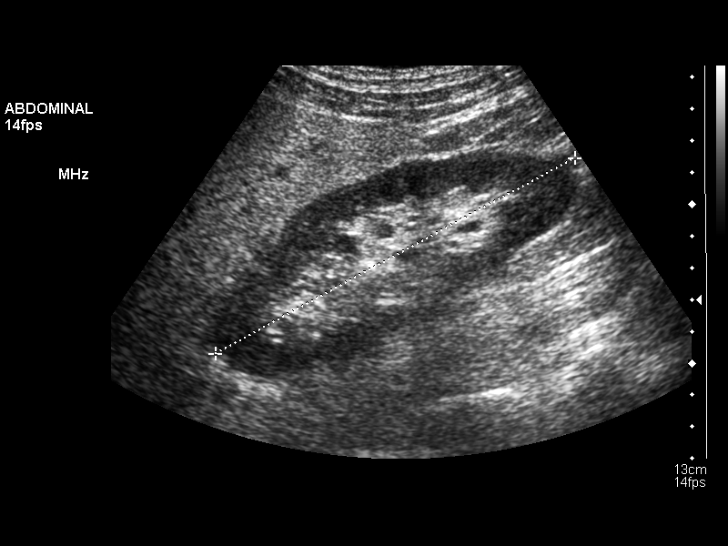
[im 24/95]
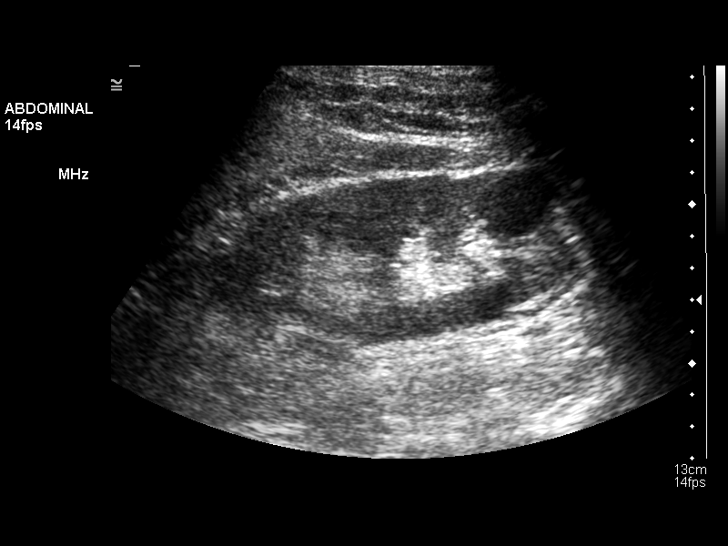
[im 32/95]
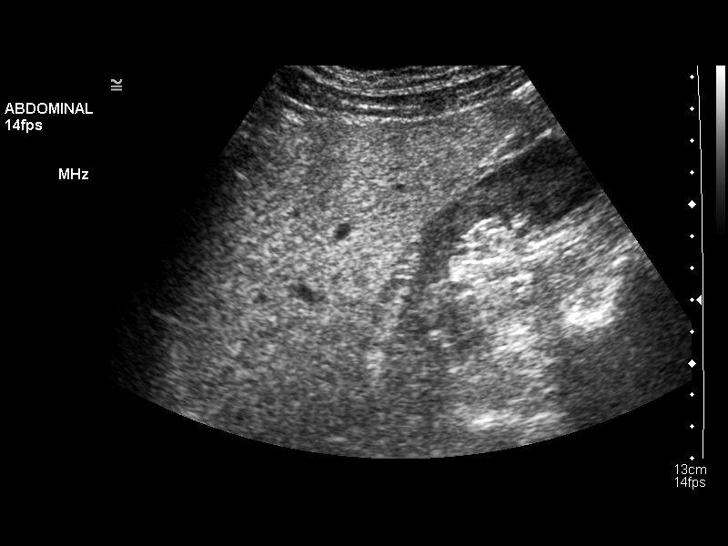
[im 36/95]
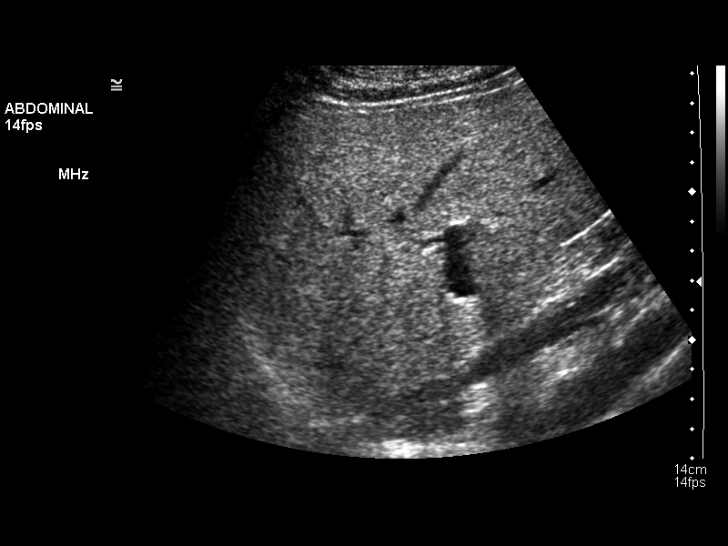
[im 44/95]
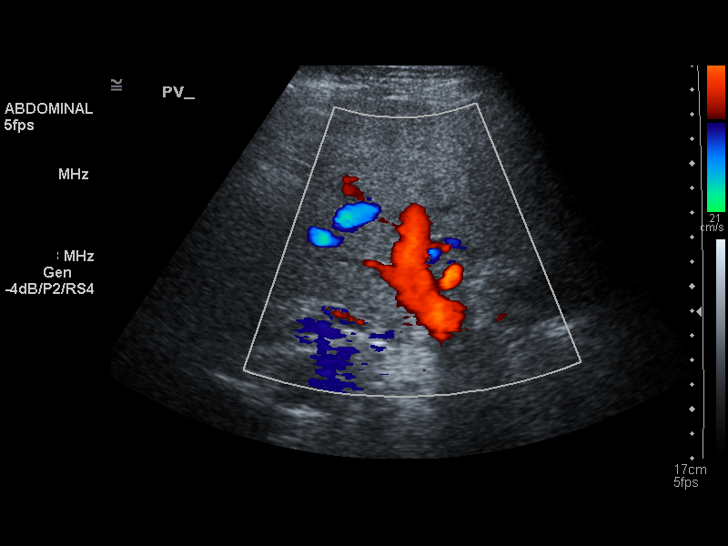
[im 51/95]
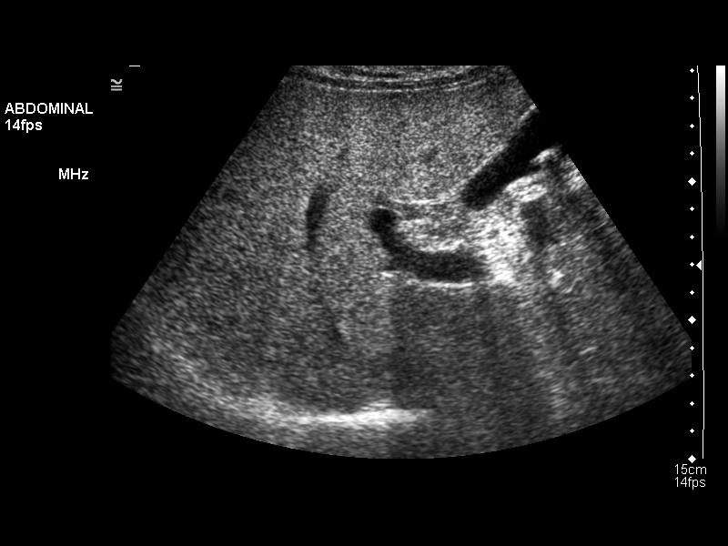
[im 59/95]
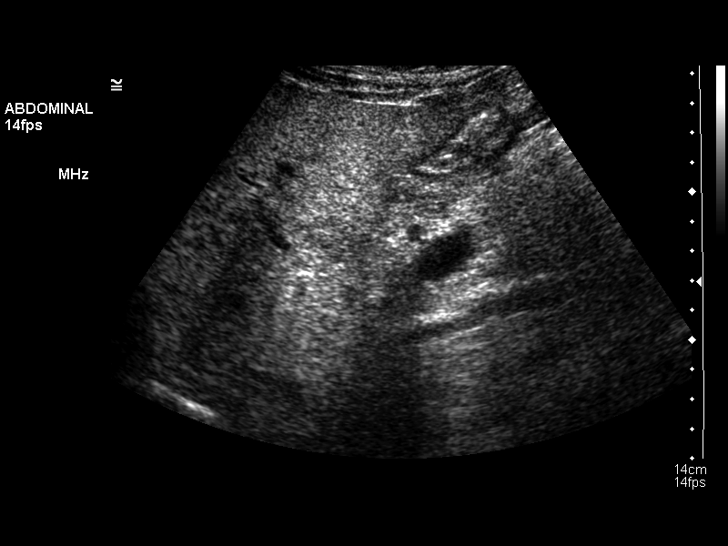
[im 63/95]
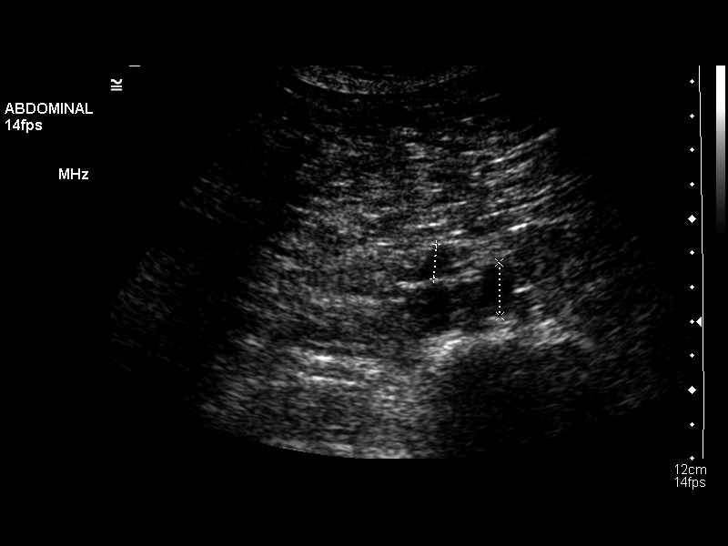
[im 71/95]
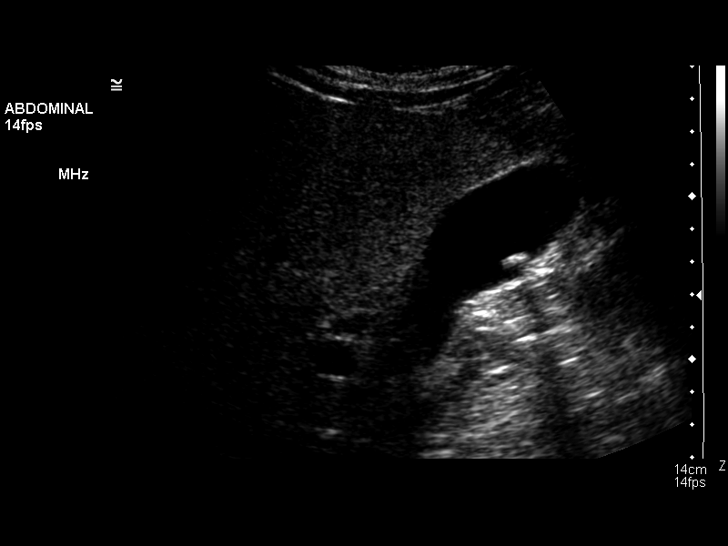
[im 79/95]
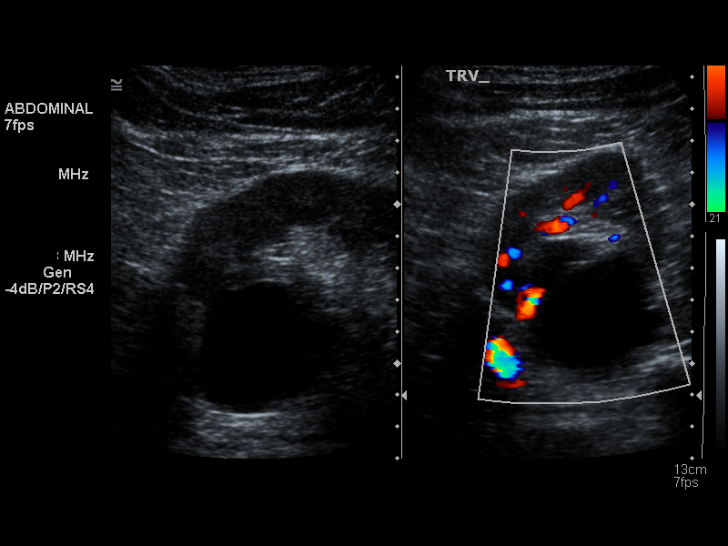
[im 87/95]
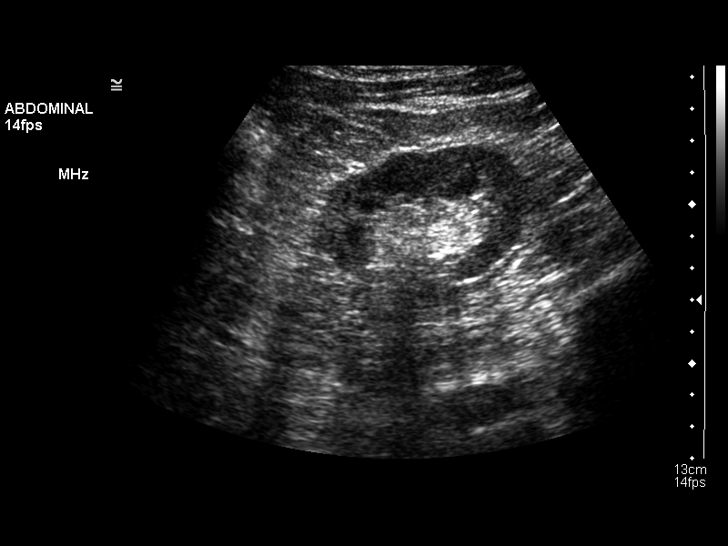
[im 95/95]
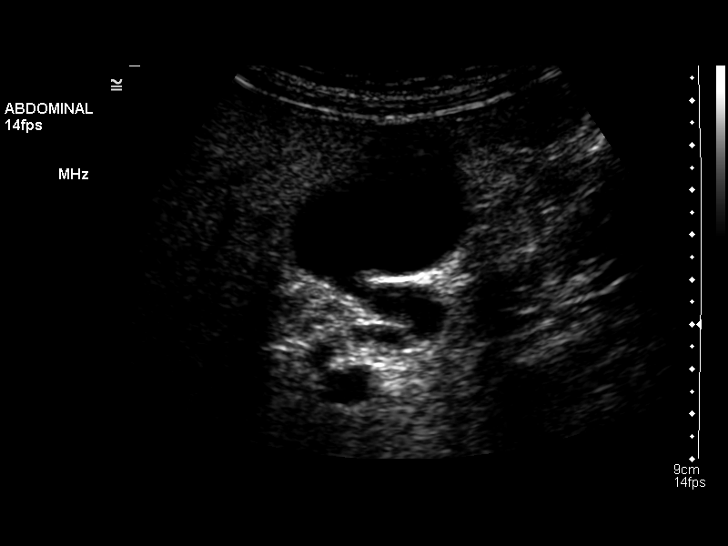

[14 of 25 positions shown; findings below may reference images not displayed]

FINDINGS: Gallbladder:

Single mobile stone measures 6 mm. No wall thickening. No
sonographic Murphy sign noted.

Common bile duct:

Diameter: 3.5 mm

Liver:

No focal liver lesion identified. There is mild diffuse
heterogeneous echotexture.

IVC:

No abnormality visualized.

Pancreas:

Visualized portion unremarkable.

Spleen:

Size and appearance within normal limits.

Right Kidney:

Length: 12.9 mm. There are 2 cysts identified within the right
kidney. The largest is in the inferior pole measuring 2.7 x 1.9 x
2.4 cm. Echogenicity within normal limits. No mass or hydronephrosis
visualized.

Left Kidney:

Length: 12.7 cm. There are 2 cysts identified within the left
kidney. The largest is in the upper pole measuring 3.9 x 4.2 x
cm. Echogenicity within normal limits. No mass or hydronephrosis
visualized.

Abdominal aorta:

No aneurysm visualized.

Other findings:

None.
IMPRESSION: 1. Gallstone.
2. Bilateral renal cysts.

## 2015-10-16 ENCOUNTER — Other Ambulatory Visit: Payer: Self-pay | Admitting: Internal Medicine

## 2015-10-16 DIAGNOSIS — F419 Anxiety disorder, unspecified: Secondary | ICD-10-CM

## 2015-10-17 ENCOUNTER — Encounter: Payer: Self-pay | Admitting: Internal Medicine

## 2015-10-17 ENCOUNTER — Ambulatory Visit (INDEPENDENT_AMBULATORY_CARE_PROVIDER_SITE_OTHER): Payer: Medicare Other | Admitting: Internal Medicine

## 2015-10-17 VITALS — BP 130/84 | HR 68 | Temp 97.3°F | Resp 16 | Ht 68.5 in | Wt 210.8 lb

## 2015-10-17 DIAGNOSIS — E785 Hyperlipidemia, unspecified: Secondary | ICD-10-CM

## 2015-10-17 DIAGNOSIS — Z79899 Other long term (current) drug therapy: Secondary | ICD-10-CM | POA: Diagnosis not present

## 2015-10-17 DIAGNOSIS — E559 Vitamin D deficiency, unspecified: Secondary | ICD-10-CM | POA: Diagnosis not present

## 2015-10-17 DIAGNOSIS — D239 Other benign neoplasm of skin, unspecified: Secondary | ICD-10-CM | POA: Diagnosis not present

## 2015-10-17 DIAGNOSIS — E119 Type 2 diabetes mellitus without complications: Secondary | ICD-10-CM

## 2015-10-17 DIAGNOSIS — I10 Essential (primary) hypertension: Secondary | ICD-10-CM

## 2015-10-17 DIAGNOSIS — D229 Melanocytic nevi, unspecified: Secondary | ICD-10-CM | POA: Insufficient documentation

## 2015-10-17 DIAGNOSIS — L570 Actinic keratosis: Secondary | ICD-10-CM | POA: Diagnosis not present

## 2015-10-17 LAB — CBC WITH DIFFERENTIAL/PLATELET
Basophils Absolute: 85 cells/uL (ref 0–200)
Basophils Relative: 1 %
EOS ABS: 170 {cells}/uL (ref 15–500)
Eosinophils Relative: 2 %
HEMATOCRIT: 46.6 % (ref 38.5–50.0)
Hemoglobin: 16.8 g/dL (ref 13.2–17.1)
LYMPHS PCT: 33 %
Lymphs Abs: 2805 cells/uL (ref 850–3900)
MCH: 33.1 pg — ABNORMAL HIGH (ref 27.0–33.0)
MCHC: 36.1 g/dL — AB (ref 32.0–36.0)
MCV: 91.7 fL (ref 80.0–100.0)
MONO ABS: 595 {cells}/uL (ref 200–950)
MONOS PCT: 7 %
MPV: 10.9 fL (ref 7.5–12.5)
NEUTROS PCT: 57 %
Neutro Abs: 4845 cells/uL (ref 1500–7800)
Platelets: 278 10*3/uL (ref 140–400)
RBC: 5.08 MIL/uL (ref 4.20–5.80)
RDW: 13.7 % (ref 11.0–15.0)
WBC: 8.5 10*3/uL (ref 3.8–10.8)

## 2015-10-17 LAB — HEPATIC FUNCTION PANEL
ALBUMIN: 4.2 g/dL (ref 3.6–5.1)
ALT: 70 U/L — ABNORMAL HIGH (ref 9–46)
AST: 51 U/L — ABNORMAL HIGH (ref 10–35)
Alkaline Phosphatase: 41 U/L (ref 40–115)
BILIRUBIN TOTAL: 1 mg/dL (ref 0.2–1.2)
Bilirubin, Direct: 0.2 mg/dL (ref <=0.2)
Indirect Bilirubin: 0.8 mg/dL (ref 0.2–1.2)
Total Protein: 7 g/dL (ref 6.1–8.1)

## 2015-10-17 LAB — LIPID PANEL
CHOLESTEROL: 208 mg/dL — AB (ref 125–200)
HDL: 41 mg/dL (ref >=40)
LDL Cholesterol: 116 mg/dL (ref <130)
TRIGLYCERIDES: 257 mg/dL — AB (ref <150)
Total CHOL/HDL Ratio: 5.1 Ratio — ABNORMAL HIGH (ref <=5.0)
VLDL: 51 mg/dL — ABNORMAL HIGH (ref <30)

## 2015-10-17 LAB — BASIC METABOLIC PANEL WITH GFR
BUN: 14 mg/dL (ref 7–25)
CALCIUM: 9.3 mg/dL (ref 8.6–10.3)
CO2: 25 mmol/L (ref 20–31)
CREATININE: 1.03 mg/dL (ref 0.70–1.25)
Chloride: 104 mmol/L (ref 98–110)
GFR, EST AFRICAN AMERICAN: 86 mL/min (ref >=60)
GFR, Est Non African American: 74 mL/min (ref >=60)
Glucose, Bld: 166 mg/dL — ABNORMAL HIGH (ref 65–99)
POTASSIUM: 4.1 mmol/L (ref 3.5–5.3)
Sodium: 139 mmol/L (ref 135–146)

## 2015-10-17 LAB — HEMOGLOBIN A1C
Hgb A1c MFr Bld: 5.6 % (ref <5.7)
MEAN PLASMA GLUCOSE: 114 mg/dL

## 2015-10-17 LAB — MAGNESIUM: MAGNESIUM: 2 mg/dL (ref 1.5–2.5)

## 2015-10-17 LAB — TSH: TSH: 1.38 mIU/L (ref 0.40–4.50)

## 2015-10-17 NOTE — Patient Instructions (Signed)

## 2015-10-18 LAB — INSULIN, RANDOM: INSULIN: 43.7 u[IU]/mL — AB (ref 2.0–19.6)

## 2015-10-18 LAB — VITAMIN D 25 HYDROXY (VIT D DEFICIENCY, FRACTURES): VIT D 25 HYDROXY: 54 ng/mL (ref 30–100)

## 2015-10-21 ENCOUNTER — Encounter: Payer: Self-pay | Admitting: Internal Medicine

## 2015-10-21 NOTE — Progress Notes (Signed)
Patient ID: Melvin Shelton, male   DOB: Dec 07, 1945, 70 y.o.   MRN: [id number]  [facility] AD[facility] & ADOLESCENT INTERNAL [facility]ICINE                       Unk Pinto, M.D.        Uvaldo Bristle. Silverio Lay, P.A.-C       Starlyn Skeans, P.A.-C   [facility]                [address] [place] [address] Telephone [phone] Telefax [phone] _________________________________________________________________________     This very nice 70 y.o. MWM presents for  follow up with Hypertension, Hyperlipidemia, Pre-Diabetes and Vitamin D Deficiency. Patient Melvin Shelton has hx/o [place] Psoriasis since the 1970's when he was in his 37's. He has ben treated with DEMARD agent and recently has several dysplastic nevi excised. He ha d several very dark brown to black small nevi measuring up to 3 mm and with informed consent the were treated by deep cryotx with Liquid nitrogen by a triple /quadruple  Freeze/thaw procedure.      Patient is treated for HTN since 2013 & BP has been controlled at home. Today's BP: 130/84 mmHg. Patient has had no complaints of any cardiac type chest pain, palpitations, dyspnea/orthopnea/PND, dizziness, claudication, or dependent edema.     Hyperlipidemia is controlled with diet & meds. Patient denies myalgias or other med SE's. Last Lipids were 10/17/2015: Cholesterol 208*; HDL 41; LDL 116; Triglycerides 257 on      Also, the patient has history of T2_NIDDM since 2005 that he is attempting to control with diet. A1c was 6.5% in 2012.  He has had no symptoms of reactive hypoglycemia, diabetic polys, paresthesias or visual blurring. Current A1c is at goal with A1c  5.6%.      Further, the patient also has history of Vitamin D Deficiency and supplements vitamin D without any suspected side-effects. Current vitamin D is 54.  Medication Sig  . HUMIRA PEN Inject into the skin. Injection every 2 weeks.  . ALPRAZolam 0.5 MG tablet TAKE 1  TABLET BY MOUTH THREE TIMES DAILY AS NEEDED  . amLODipine  5 MG tab TAKE 1 TABLET(5 MG) BY MOUTH DAILY  . aspirin 81 MG tablet Take 81 mg by mouth daily.  . Calcium  600  Take by mouth daily.  Marland Kitchen VITAMIN D5000 UNITS  t tab  2 x day  . CLOBETASOL PROPIO E -  0.05% emoll crm APPLY TO THE AFFECTED AREA AS DIRECTED  . dexamethasone  0.75 MG  TAKE 1/2 TO 1 TABLET BY MOUTH EVERY DAY  . fenofibrate 160 MG tablet Take 1 tablet (160 mg total) by mouth daily.  . Magnesium 500 MG CAPS Take 500 mg by mouth daily.  . Omega-3 FISH OIL 1200 MG CAPS Take by mouth 3 (three) times daily.  . ENBREL  50 MG/ML inj Inject into the skin.   No Known Allergies  PMHx:   Past Medical History  Diagnosis Date  . Hypertension   . Psoriatic arthritis ([place])   . Hyperlipidemia   . Hypogonadism male   . Osteoporosis   . Renal glycosuria ([place])   . Elevated LFTs   . Hx of colonic polyp    Immunization History  Administered Date(s) Administered  . DT 03/17/2014  . PPD Test 07/19/2015  . Pneumococcal-Unspecified 06/04/2003  No past surgical history on file. FHx:    Reviewed / unchanged  SHx:    Reviewed / unchanged  Systems Review:  Constitutional: Denies fever, chills, wt changes, headaches, insomnia, fatigue, night sweats, change in appetite. Eyes: Denies redness, blurred vision, diplopia, discharge, itchy, watery eyes.  ENT: Denies discharge, congestion, post nasal drip, epistaxis, sore throat, earache, hearing loss, dental pain, tinnitus, vertigo, sinus pain, snoring.  CV: Denies chest pain, palpitations, irregular heartbeat, syncope, dyspnea, diaphoresis, orthopnea, PND, claudication or edema. Respiratory: denies cough, dyspnea, DOE, pleurisy, hoarseness, laryngitis, wheezing.  Gastrointestinal: Denies dysphagia, odynophagia, heartburn, reflux, water brash, abdominal pain or cramps, nausea, vomiting, bloating, diarrhea, constipation, hematemesis, melena, hematochezia  or hemorrhoids. Genitourinary: Denies  dysuria, frequency, urgency, nocturia, hesitancy, discharge, hematuria or flank pain. Musculoskeletal: Denies arthralgias, myalgias, stiffness, jt. swelling, pain, limping or strain/sprain.  Skin: Denies pruritus, rash, hives, warts, acne, eczema or change in skin lesion(s). Neuro: No weakness, tremor, incoordination, spasms, paresthesia or pain. Psychiatric: Denies confusion, memory loss or sensory loss. Endo: Denies change in weight, skin or hair change.  Heme/Lymph: No excessive bleeding, bruising or enlarged lymph nodes.  Physical Exam  BP 130/84 mmHg  Pulse 68  Temp(Src) 97.3 F (36.3 C)  Resp 16  Ht 5' 8.5" (1.74 m)  Wt 210 lb 12.8 oz (95.618 kg)  BMI 31.58 kg/m2  Appears well nourished and in no distress.  Eyes: PERRLA, EOMs, conjunctiva no swelling or erythema. Sinuses: No frontal/maxillary tenderness ENT/Mouth: EAC's clear, TM's nl w/o erythema, bulging. Nares clear w/o erythema, swelling, exudates. Oropharynx clear without erythema or exudates. Oral hygiene is good. Tongue normal, non obstructing. Hearing intact.  Neck: Supple. Thyroid nl. Car 2+/2+ without bruits, nodes or JVD. Chest: Respirations nl with BS clear & equal w/o rales, rhonchi, wheezing or stridor.  Cor: Heart sounds normal w/ regular rate and rhythm without sig. murmurs, gallops, clicks, or rubs. Peripheral pulses normal and equal  without edema.  Abdomen: Soft & bowel sounds normal. Non-tender w/o guarding, rebound, hernias, masses, or organomegaly.  Lymphatics: Unremarkable.  Musculoskeletal: Full ROM all peripheral extremities, joint stability, 5/5 strength, and normal gait.  Skin: Warm, dry without exposed rashes, lesions or ecchymosis apparent. Previous treated dark nevi site appear clear.     Procedure: ( CPT- 17000 & 17003 x 3) -  Patient has multiple 7 flat very dark brown to black lesions varying in size from 2 mm to 3-4 mm scattered over the anterior / posterior trunk - all treated with liq Nitrogen  by triple / quadrtuple freeze-thaw technique for cryo destruction. (# 7 lesions treated).  Neuro: Cranial nerves intact, reflexes equal bilaterally. Sensory-motor testing grossly intact. Tendon reflexes grossly intact.  Pysch: Alert & oriented x 3.  Insight and judgement nl & appropriate. No ideations.  Assessment and Plan:  1. Essential hypertension  - TSH  2. Hyperlipidemia  - Lipid panel - TSH  3. Diabetes mellitus without complication (HCC)  - Hemoglobin A1c - Insulin, random  4. Multiple atypical nevi   5. Vitamin D deficiency  - VITAMIN D 25 Hydroxy (Vit-D Deficiency, Fractures)  6. Dysplastic nevi  Procedure : CPT 17000 & 89381 x 3   7. Medication management  - CBC with Differential/Platelet - BASIC METABOLIC PANEL WITH GFR - Hepatic function panel - Magnesium   Recommended regular exercise, BP monitoring, weight control, and discussed med and SE's. Recommended labs to assess and monitor clinical status. Further disposition pending results of labs. Over 30 minutes of exam, counseling, chart review  was performed

## 2016-01-08 DIAGNOSIS — L409 Psoriasis, unspecified: Secondary | ICD-10-CM | POA: Diagnosis not present

## 2016-01-08 DIAGNOSIS — L4050 Arthropathic psoriasis, unspecified: Secondary | ICD-10-CM | POA: Diagnosis not present

## 2016-01-08 DIAGNOSIS — L601 Onycholysis: Secondary | ICD-10-CM | POA: Diagnosis not present

## 2016-01-23 ENCOUNTER — Ambulatory Visit: Payer: Self-pay | Admitting: Internal Medicine

## 2016-01-24 ENCOUNTER — Telehealth: Payer: Self-pay | Admitting: *Deleted

## 2016-01-24 NOTE — Telephone Encounter (Signed)
Patient declines having Eye exam at this time.  Patient not taking any DM Rxs and A1C has not been above 6.5.

## 2016-01-29 ENCOUNTER — Ambulatory Visit (INDEPENDENT_AMBULATORY_CARE_PROVIDER_SITE_OTHER): Payer: Medicare Other | Admitting: Internal Medicine

## 2016-01-29 ENCOUNTER — Encounter: Payer: Self-pay | Admitting: Internal Medicine

## 2016-01-29 VITALS — BP 128/76 | HR 72 | Temp 98.0°F | Resp 16 | Ht 68.5 in | Wt 202.0 lb

## 2016-01-29 DIAGNOSIS — E785 Hyperlipidemia, unspecified: Secondary | ICD-10-CM | POA: Diagnosis not present

## 2016-01-29 DIAGNOSIS — Z79899 Other long term (current) drug therapy: Secondary | ICD-10-CM | POA: Diagnosis not present

## 2016-01-29 DIAGNOSIS — I10 Essential (primary) hypertension: Secondary | ICD-10-CM

## 2016-01-29 DIAGNOSIS — E119 Type 2 diabetes mellitus without complications: Secondary | ICD-10-CM

## 2016-01-29 DIAGNOSIS — E559 Vitamin D deficiency, unspecified: Secondary | ICD-10-CM

## 2016-01-29 DIAGNOSIS — L4050 Arthropathic psoriasis, unspecified: Secondary | ICD-10-CM

## 2016-01-29 LAB — BASIC METABOLIC PANEL WITH GFR
BUN: 11 mg/dL (ref 7–25)
CALCIUM: 9.7 mg/dL (ref 8.6–10.3)
CO2: 25 mmol/L (ref 20–31)
Chloride: 104 mmol/L (ref 98–110)
Creat: 1.14 mg/dL (ref 0.70–1.25)
GFR, EST AFRICAN AMERICAN: 76 mL/min (ref >=60)
GFR, Est Non African American: 66 mL/min (ref >=60)
GLUCOSE: 103 mg/dL — AB (ref 65–99)
POTASSIUM: 4.4 mmol/L (ref 3.5–5.3)
Sodium: 139 mmol/L (ref 135–146)

## 2016-01-29 LAB — CBC WITH DIFFERENTIAL/PLATELET
Basophils Absolute: 71 cells/uL (ref 0–200)
Basophils Relative: 1 %
EOS ABS: 142 {cells}/uL (ref 15–500)
Eosinophils Relative: 2 %
HEMATOCRIT: 53.2 % — AB (ref 38.5–50.0)
Hemoglobin: 18.9 g/dL — ABNORMAL HIGH (ref 13.2–17.1)
LYMPHS PCT: 38 %
Lymphs Abs: 2698 cells/uL (ref 850–3900)
MCH: 32.9 pg (ref 27.0–33.0)
MCHC: 35.5 g/dL (ref 32.0–36.0)
MCV: 92.7 fL (ref 80.0–100.0)
MONO ABS: 639 {cells}/uL (ref 200–950)
MPV: 9.8 fL (ref 7.5–12.5)
Monocytes Relative: 9 %
NEUTROS PCT: 50 %
Neutro Abs: 3550 cells/uL (ref 1500–7800)
Platelets: 227 10*3/uL (ref 140–400)
RBC: 5.74 MIL/uL (ref 4.20–5.80)
RDW: 13.1 % (ref 11.0–15.0)
WBC: 7.1 10*3/uL (ref 3.8–10.8)

## 2016-01-29 LAB — HEPATIC FUNCTION PANEL
ALBUMIN: 4.5 g/dL (ref 3.6–5.1)
ALK PHOS: 37 U/L — AB (ref 40–115)
ALT: 73 U/L — ABNORMAL HIGH (ref 9–46)
AST: 52 U/L — ABNORMAL HIGH (ref 10–35)
Bilirubin, Direct: 0.2 mg/dL (ref <=0.2)
Indirect Bilirubin: 1 mg/dL (ref 0.2–1.2)
Total Bilirubin: 1.2 mg/dL (ref 0.2–1.2)
Total Protein: 7.4 g/dL (ref 6.1–8.1)

## 2016-01-29 LAB — LIPID PANEL
CHOLESTEROL: 239 mg/dL — AB (ref 125–200)
HDL: 42 mg/dL (ref >=40)
LDL Cholesterol: 152 mg/dL — ABNORMAL HIGH (ref <130)
TRIGLYCERIDES: 223 mg/dL — AB (ref <150)
Total CHOL/HDL Ratio: 5.7 Ratio — ABNORMAL HIGH (ref <=5.0)
VLDL: 45 mg/dL — ABNORMAL HIGH (ref <30)

## 2016-01-29 LAB — TSH: TSH: 0.97 m[IU]/L (ref 0.40–4.50)

## 2016-01-29 NOTE — Progress Notes (Signed)
Assessment and Plan:  Hypertension:  -Continue medication,  -monitor blood pressure at home.  -Continue DASH diet.   -Reminder to go to the ER if any CP, SOB, nausea, dizziness, severe HA, changes vision/speech, left arm numbness and tingling, and jaw pain.  Cholesterol: -Continue diet and exercise.  -Check cholesterol.   Diabetes: -CBG report by patient sound well controlled -cont vegetarian diet if desired -Continue diet and exercise.  -Check A1C  Vitamin D Def: -check level -continue medications.   Psoriatic arthritis and psoriasis -managed by derm -trying to change to cosentyx -cont pen V prescribed by derm   Continue diet and meds as discussed. Further disposition pending results of labs.  HPI 70 y.o. male  presents for 3 month follow up with hypertension, hyperlipidemia, prediabetes and vitamin D.   His blood pressure has been controlled at home, today their BP is BP: 128/76.   He does not workout. He denies chest pain, shortness of breath, dizziness.  He reports that he is working on his diet.  He reports that he has been eating vegetarian lately.  He reports that he has been on the road a lot lately.  He does eat well when he is at home though.     He is not on cholesterol medication and denies myalgias. His cholesterol is at goal. The cholesterol last visit was:   Lab Results  Component Value Date   CHOL 208 (H) 10/17/2015   HDL 41 10/17/2015   LDLCALC 116 10/17/2015   TRIG 257 (H) 10/17/2015   CHOLHDL 5.1 (H) 10/17/2015     He has been working on diet and exercise for prediabetes, and denies foot ulcerations, hyperglycemia, hypoglycemia , increased appetite, nausea, paresthesia of the feet, polydipsia, polyuria, visual disturbances, vomiting and weight loss. Last A1C in the office was:  Lab Results  Component Value Date   HGBA1C 5.6 10/17/2015    Patient is on Vitamin D supplement.  Lab Results  Component Value Date   VD25OH 10 10/17/2015      He  reports that they are trying to change his humira to cosentyx.  He reports that so far he hasn't gotten a lot of coverage with his insurance so they haven't been able to start it yet.  He reports that he is seeing the dermatologists for this.   Current Medications:  Current Outpatient Prescriptions on File Prior to Visit  Medication Sig Dispense Refill  . Adalimumab (HUMIRA PEN [place]) Inject into the skin. Injection every 2 weeks.    . ALPRAZolam (XANAX) 0.5 MG tablet TAKE 1 TABLET BY MOUTH THREE TIMES DAILY AS NEEDED 270 tablet 1  . amLODipine (NORVASC) 5 MG tablet TAKE 1 TABLET(5 MG) BY MOUTH DAILY 90 tablet 1  . aspirin 81 MG tablet Take 81 mg by mouth daily.    . Calcium Carbonate (CALCIUM 600 PO) Take by mouth daily.    . Cholecalciferol (VITAMIN D3) 5000 UNITS TABS t tab  2 x day    . Clobetasol Prop Emollient Base (CLOBETASOL PROPIONATE E) 0.05 % emollient cream APPLY TO THE AFFECTED AREA AS DIRECTED 180 g 99  . dexamethasone (DECADRON) 0.75 MG tablet TAKE 1/2 TO 1 TABLET BY MOUTH EVERY DAY 90 tablet 1  . fenofibrate 160 MG tablet Take 1 tablet (160 mg total) by mouth daily. 90 tablet 1  . Magnesium 500 MG CAPS Take 500 mg by mouth daily.    . Omega-3 Fatty Acids (FISH OIL) 1200 MG CAPS Take by mouth 3 (  three) times daily.    . penicillin v potassium (VEETID) 500 MG tablet      No current facility-administered medications on file prior to visit.     Medical History:  Past Medical History:  Diagnosis Date  . Elevated LFTs   . Hx of colonic polyp   . Hyperlipidemia   . Hypertension   . Hypogonadism male   . Osteoporosis   . Psoriatic arthritis ([place])   . Renal glycosuria ([facility])     Allergies: No Known Allergies   Review of Systems:  Review of Systems  Constitutional: Negative for chills, fever and malaise/fatigue.  HENT: Negative for congestion, ear pain and sore throat.   Eyes: Negative.   Respiratory: Negative for cough, shortness of breath and wheezing.   Cardiovascular:  Negative for chest pain, palpitations and leg swelling.  Gastrointestinal: Negative for abdominal pain, blood in stool, constipation, diarrhea, heartburn and melena.  Genitourinary: Negative.   Skin: Negative.   Neurological: Negative for dizziness, sensory change, loss of consciousness and headaches.  Psychiatric/Behavioral: Negative for depression. The patient is not nervous/anxious and does not have insomnia.     Family history- Review and unchanged  Social history- Review and unchanged  Physical Exam: BP 128/76   Pulse 72   Temp 98 F (36.7 C) (Temporal)   Resp 16   Ht 5' 8.5" (1.74 m)   Wt 202 lb (91.6 kg)   BMI 30.27 kg/m  Wt Readings from Last 3 Encounters:  01/29/16 202 lb (91.6 kg)  10/17/15 210 lb 12.8 oz (95.6 kg)  08/01/15 204 lb 6.4 oz (92.7 kg)    General Appearance: Well nourished well developed, in no apparent distress. Eyes: PERRLA, EOMs, conjunctiva no swelling or erythema ENT/Mouth: Ear canals normal without obstruction, swelling, erythma, discharge.  TMs normal bilaterally.  Oropharynx moist, clear, without exudate, or postoropharyngeal swelling. Neck: Supple, thyroid normal,no cervical adenopathy  Respiratory: Respiratory effort normal, Breath sounds clear A&P without rhonchi, wheeze, or rale.  No retractions, no accessory usage. Cardio: RRR with no MRGs. Brisk peripheral pulses without edema.  Abdomen: Soft, + BS,  Non tender, no guarding, rebound, hernias, masses. Musculoskeletal: Full ROM, 5/5 strength, Normal gait Skin: Warm, dry without rashes, lesions, ecchymosis.  Neuro: Awake and oriented X 3, Cranial nerves intact. Normal muscle tone, no cerebellar symptoms. Psych: Normal affect, Insight and Judgment appropriate.    Starlyn Skeans, PA-C 2:15 PM [facility]

## 2016-01-30 LAB — HEMOGLOBIN A1C
Hgb A1c MFr Bld: 5.7 % — ABNORMAL HIGH (ref <5.7)
MEAN PLASMA GLUCOSE: 117 mg/dL

## 2016-02-12 ENCOUNTER — Other Ambulatory Visit: Payer: Self-pay | Admitting: Internal Medicine

## 2016-02-12 DIAGNOSIS — F419 Anxiety disorder, unspecified: Secondary | ICD-10-CM

## 2016-02-12 DIAGNOSIS — E782 Mixed hyperlipidemia: Secondary | ICD-10-CM

## 2016-02-12 NOTE — Telephone Encounter (Signed)
RX CALLED INTO [employer],

## 2016-04-17 ENCOUNTER — Encounter: Payer: Self-pay | Admitting: Internal Medicine

## 2016-05-17 ENCOUNTER — Other Ambulatory Visit: Payer: Self-pay | Admitting: Internal Medicine

## 2016-05-17 DIAGNOSIS — E782 Mixed hyperlipidemia: Secondary | ICD-10-CM

## 2016-06-04 ENCOUNTER — Encounter: Payer: Self-pay | Admitting: Internal Medicine

## 2016-06-04 ENCOUNTER — Ambulatory Visit (INDEPENDENT_AMBULATORY_CARE_PROVIDER_SITE_OTHER): Payer: Medicare Other | Admitting: Internal Medicine

## 2016-06-04 VITALS — BP 138/82 | HR 72 | Temp 97.3°F | Resp 16 | Ht 69.0 in | Wt 206.4 lb

## 2016-06-04 DIAGNOSIS — I10 Essential (primary) hypertension: Secondary | ICD-10-CM

## 2016-06-04 DIAGNOSIS — Z1212 Encounter for screening for malignant neoplasm of rectum: Secondary | ICD-10-CM

## 2016-06-04 DIAGNOSIS — R6889 Other general symptoms and signs: Secondary | ICD-10-CM | POA: Diagnosis not present

## 2016-06-04 DIAGNOSIS — E559 Vitamin D deficiency, unspecified: Secondary | ICD-10-CM | POA: Diagnosis not present

## 2016-06-04 DIAGNOSIS — E119 Type 2 diabetes mellitus without complications: Secondary | ICD-10-CM | POA: Diagnosis not present

## 2016-06-04 DIAGNOSIS — Z125 Encounter for screening for malignant neoplasm of prostate: Secondary | ICD-10-CM | POA: Diagnosis not present

## 2016-06-04 DIAGNOSIS — Z136 Encounter for screening for cardiovascular disorders: Secondary | ICD-10-CM

## 2016-06-04 DIAGNOSIS — L4050 Arthropathic psoriasis, unspecified: Secondary | ICD-10-CM

## 2016-06-04 DIAGNOSIS — Z79899 Other long term (current) drug therapy: Secondary | ICD-10-CM | POA: Diagnosis not present

## 2016-06-04 DIAGNOSIS — E782 Mixed hyperlipidemia: Secondary | ICD-10-CM

## 2016-06-04 DIAGNOSIS — Z0001 Encounter for general adult medical examination with abnormal findings: Secondary | ICD-10-CM | POA: Diagnosis not present

## 2016-06-04 LAB — CBC WITH DIFFERENTIAL/PLATELET
BASOS PCT: 1 %
Basophils Absolute: 81 cells/uL (ref 0–200)
EOS PCT: 1 %
Eosinophils Absolute: 81 cells/uL (ref 15–500)
HEMATOCRIT: 48.4 % (ref 38.5–50.0)
Hemoglobin: 17.5 g/dL — ABNORMAL HIGH (ref 13.2–17.1)
LYMPHS PCT: 32 %
Lymphs Abs: 2592 cells/uL (ref 850–3900)
MCH: 32.5 pg (ref 27.0–33.0)
MCHC: 36.2 g/dL — AB (ref 32.0–36.0)
MCV: 89.8 fL (ref 80.0–100.0)
MONO ABS: 729 {cells}/uL (ref 200–950)
MPV: 10.3 fL (ref 7.5–12.5)
Monocytes Relative: 9 %
NEUTROS PCT: 57 %
Neutro Abs: 4617 cells/uL (ref 1500–7800)
PLATELETS: 330 10*3/uL (ref 140–400)
RBC: 5.39 MIL/uL (ref 4.20–5.80)
RDW: 13.8 % (ref 11.0–15.0)
WBC: 8.1 10*3/uL (ref 3.8–10.8)

## 2016-06-04 LAB — URINALYSIS, MICROSCOPIC ONLY
Bacteria, UA: NONE SEEN [HPF]
CRYSTALS: NONE SEEN [HPF]
Casts: NONE SEEN [LPF]
RBC / HPF: NONE SEEN RBC/HPF (ref <=2)
SQUAMOUS EPITHELIAL / LPF: NONE SEEN [HPF] (ref <=5)
WBC, UA: NONE SEEN WBC/HPF (ref <=5)
Yeast: NONE SEEN [HPF]

## 2016-06-04 LAB — URINALYSIS, ROUTINE W REFLEX MICROSCOPIC
Bilirubin Urine: NEGATIVE
HGB URINE DIPSTICK: NEGATIVE
KETONES UR: NEGATIVE
LEUKOCYTES UA: NEGATIVE
NITRITE: NEGATIVE
PH: 8 (ref 5.0–8.0)
Protein, ur: NEGATIVE
SPECIFIC GRAVITY, URINE: 1.017 (ref 1.001–1.035)

## 2016-06-04 NOTE — Progress Notes (Signed)
[facility]   Unk Pinto, M.D.    Uvaldo Bristle. Silverio Lay, P.A.-C      Starlyn Skeans, P.A.-C  [facility]                [address] [place] [address] Telephone [phone] Telefax [phone] Annual  Screening/Preventative Visit  & Comprehensive Evaluation & Examination     This very nice 71 y.o. MWM presents for a Screening/Preventative Visit & comprehensive evaluation and management of multiple medical co-morbidities.  Patient has been followed for HTN, Prediabetes, Hyperlipidemia and Vitamin D Deficiency.     Other problems include [place] Psoriasis and minimal psoriatic arthritis and has been recently swithed about 4 months ago from Enbrel to Cosentyx with apparent excellent clearing of his skin.      HTN predates since 2013. Patient's BP has been controlled at home.  Today's BP is at goal - 138/82. Patient denies any cardiac symptoms as chest pain, palpitations, shortness of breath, dizziness or ankle swelling.     Patient's hyperlipidemia was not controlled with diet and fenofibrates. Patient denies myalgias or other medication SE's. Last lipids were not at goal: Lab Results  Component Value Date   CHOL 239 (H) 01/29/2016   HDL 42 01/29/2016   LDLCALC 152 (H) 01/29/2016   TRIG 223 (H) 01/29/2016   CHOLHDL 5.7 (H) 01/29/2016      Patient has Morbid Obesity (BMI 30+) and consequent PreDiabetes circa 2005  and then 6.5% in 2012. Patient denies reactive hypoglycemic symptoms, visual blurring, diabetic polys or paresthesias. Last A1c was almost at goal.  Lab Results  Component Value Date   HGBA1C 5.7 (H) 01/29/2016       Finally, patient has history of Vitamin D Deficiency and last vitamin D was still slightly at goal: Lab Results  Component Value Date   VD25OH 54 10/17/2015   Current Outpatient Prescriptions on File Prior to Visit  Medication Sig  . ALPRAZolam  0.5 MG tablet  TAKE 1 TABLET BY MOUTH THREE TIMES DAILY AS NEEDED  . amLODipine  5 MG tablet TAKE 1 TAB DAILY  . aspirin 81 MG tablet Take  daily.  Marland Kitchen CALCIUM 600  Take by mouth daily.  Marland Kitchen VITAMIN D 5000 UNITS TABS t tab  2 x day  . CLOBETASOL PROPIO E 0.05 %  crm APPLY TO THE AFFECTED AREA AS DIRECTED  . dexamethasone  0.75 MG tablet TAKE 1/2 TO 1 TABLET BY MOUTH EVERY DAY  . fenofibrate 160 MG tablet TAKE 1 TABLET(160 MG) BY MOUTH DAILY  . Magnesium 500 MG CAPS Take 500 mg by mouth daily.  . Omega-3 FISH OIL 1200 MG CAPS Take by mouth 3 (three) times daily.  . penicillin vee K+  500 MG tablet    No Known Allergies   Past Medical History:  Diagnosis Date  . Elevated LFTs   . Hx of colonic polyp   . Hyperlipidemia   . Hypertension   . Hypogonadism male   . Osteoporosis   . Psoriatic arthritis ([place])   . Renal glycosuria [facility])    Health Maintenance  Topic Date Due  . FOOT EXAM  03/21/2016  . URINE MICROALBUMIN  03/21/2016  . OPHTHALMOLOGY EXAM  06/19/2016 (Originally 10/09/1955)  . TETANUS/TDAP  11/14/2016 (Originally 10/10/1966)  . ZOSTAVAX  11/22/2016 (Originally 10/10/2007)  . PNA vac Low Risk Adult (1 of  2 - PCV13) 11/29/2016 (Originally 10/09/2012)  . INFLUENZA VACCINE  03/28/2017 (Originally 01/02/2016)  . HEMOGLOBIN A1C  07/31/2016  . COLONOSCOPY  02/14/2020  . Hepatitis C Screening  Completed   Immunization History  Administered Date(s) Administered  . DT 03/17/2014  . PPD Test 07/19/2015  . Pneumococcal-Unspecified 06/04/2003   No past surgical history on file. Family History  Problem Relation Age of Onset  . Cancer Mother     lymphoma  . Heart disease Father   . Hypertension Father   . Diabetes Father   . Cancer Father     prostate   Social History   Social History  . Marital status: Married    Spouse name: N/A  . Number of children: N/A  . Years of education: N/A   Occupational History  . Musician   Social History Main Topics  . Smoking status: Never Smoker  .  Smokeless tobacco: Not on file  . Alcohol use Yes     Comment: ocassional  . Drug use: No  . Sexual activity: Not on file    ROS Constitutional: Denies fever, chills, weight loss/gain, headaches, insomnia,  night sweats or change in appetite. Does c/o fatigue. Eyes: Denies redness, blurred vision, diplopia, discharge, itchy or watery eyes.  ENT: Denies discharge, congestion, post nasal drip, epistaxis, sore throat, earache, hearing loss, dental pain, Tinnitus, Vertigo, Sinus pain or snoring.  Cardio: Denies chest pain, palpitations, irregular heartbeat, syncope, dyspnea, diaphoresis, orthopnea, PND, claudication or edema Respiratory: denies cough, dyspnea, DOE, pleurisy, hoarseness, laryngitis or wheezing.  Gastrointestinal: Denies dysphagia, heartburn, reflux, water brash, pain, cramps, nausea, vomiting, bloating, diarrhea, constipation, hematemesis, melena, hematochezia, jaundice or hemorrhoids Genitourinary: Denies dysuria, frequency, urgency, nocturia, hesitancy, discharge, hematuria or flank pain Musculoskeletal: Denies arthralgia, myalgia, stiffness, Jt. Swelling, pain, limp or strain/sprain. Denies Falls. Skin: Denies puritis, rash, hives, warts, acne, eczema or change in skin lesion Neuro: No weakness, tremor, incoordination, spasms, paresthesia or pain Psychiatric: Denies confusion, memory loss or sensory loss. Denies Depression. Endocrine: Denies change in weight, skin, hair change, nocturia, and paresthesia, diabetic polys, visual blurring or hyper / hypo glycemic episodes.  Heme/Lymph: No excessive bleeding, bruising or enlarged lymph nodes.  Physical Exam  BP 138/82   Pulse 72   Temp 97.3 F (36.3 C)   Resp 16   Ht 5' 9"  (1.753 m)   Wt 206 lb 6.4 oz (93.6 kg)   BMI 30.48 kg/m   General Appearance: Well nourished, in no apparent distress.  Eyes: PERRLA, EOMs, conjunctiva no swelling or erythema, normal fundi and vessels. Sinuses: No frontal/maxillary  tenderness ENT/Mouth: EACs patent / TMs  nl. Nares clear without erythema, swelling, mucoid exudates. Oral hygiene is good. No erythema, swelling, or exudate. Tongue normal, non-obstructing. Tonsils not swollen or erythematous. Hearing normal.  Neck: Supple, thyroid normal. No bruits, nodes or JVD. Respiratory: Respiratory effort normal.  BS equal and clear bilateral without rales, rhonci, wheezing or stridor. Cardio: Heart sounds are normal with regular rate and rhythm and no murmurs, rubs or gallops. Peripheral pulses are normal and equal bilaterally without edema. No aortic or femoral bruits. Chest: symmetric with normal excursions and percussion.  Abdomen: Soft, rotund with Nl bowel sounds. Nontender, no guarding, rebound, hernias, masses, or organomegaly.  Lymphatics: Non tender without lymphadenopathy.  Genitourinary: No hernias.Testes nl. DRE - prostate nl for age - smooth & firm w/o nodules. Musculoskeletal: Full ROM all peripheral extremities, joint stability, 5/5 strength, and normal gait. Skin: Warm and dry without rashes, lesions, cyanosis,  clubbing or  ecchymosis.  Neuro: Cranial nerves intact, reflexes equal bilaterally. Normal muscle tone, no cerebellar symptoms. Sensation intact.  Pysch: Alert and oriented X 3 with normal affect, insight and judgment appropriate.   Assessment and Plan  1. Annual Preventative/Screening Exam   - BASIC METABOLIC PANEL WITH GFR - Hepatic function panel - TSH  2. Essential hypertension  - Microalbumin / creatinine urine ratio - EKG 12-Lead - [place], RETROPERITNL ABD,  LTD - Urinalysis, Routine w reflex microscopic - Hepatic function panel - Lipid panel - TSH  3. Mixed hyperlipidemia  - EKG 12-Lead - [place], RETROPERITNL ABD,  LTD - Hemoglobin A1c - Insulin, random  4. Diabetes mellitus without complication ([place])  - EKG 12-Lead - [place], RETROPERITNL ABD,  LTD - VITAMIN D 25 Hydroxy   5. Vitamin D deficiency   6. Psoriatic arthritis  ([place])  - COSENTYX SENSOREADY 300 DOSE 150 MG/ML SOAJ;   7. Screening for rectal cancer  - POC Hemoccult Bld/Stl   8. Prostate cancer screening  - PSA  9. Screening for ischemic heart disease  - EKG 12-Lead  10. Screening for AAA (aortic abdominal aneurysm)  - [place], RETROPERITNL ABD,  LTD  11. Medication management  - Urinalysis, Routine w reflex microscopic - CBC with Differential/Platelet - BASIC METABOLIC PANEL WITH GFR - Hepatic function panel - Magnesium       Continue prudent diet as discussed, weight control, BP monitoring, regular exercise, and medications as discussed.  Discussed med effects and SE's. Routine screening labs and tests as requested with regular follow-up as recommended. Over 40 minutes of exam, counseling, chart review and high complex critical decision making was performed

## 2016-06-04 NOTE — Patient Instructions (Signed)

## 2016-06-05 LAB — BASIC METABOLIC PANEL WITH GFR
BUN: 13 mg/dL (ref 7–25)
CALCIUM: 10.1 mg/dL (ref 8.6–10.3)
CO2: 23 mmol/L (ref 20–31)
Chloride: 103 mmol/L (ref 98–110)
Creat: 0.98 mg/dL (ref 0.70–1.25)
GFR, EST NON AFRICAN AMERICAN: 79 mL/min (ref >=60)
Glucose, Bld: 135 mg/dL — ABNORMAL HIGH (ref 65–99)
POTASSIUM: 4.1 mmol/L (ref 3.5–5.3)
SODIUM: 140 mmol/L (ref 135–146)

## 2016-06-05 LAB — HEPATIC FUNCTION PANEL
ALK PHOS: 41 U/L (ref 40–115)
ALT: 54 U/L — ABNORMAL HIGH (ref 9–46)
AST: 39 U/L — ABNORMAL HIGH (ref 10–35)
Albumin: 4.4 g/dL (ref 3.6–5.1)
BILIRUBIN DIRECT: 0.1 mg/dL (ref <=0.2)
BILIRUBIN INDIRECT: 0.7 mg/dL (ref 0.2–1.2)
BILIRUBIN TOTAL: 0.8 mg/dL (ref 0.2–1.2)
TOTAL PROTEIN: 7.4 g/dL (ref 6.1–8.1)

## 2016-06-05 LAB — LIPID PANEL
CHOL/HDL RATIO: 5 ratio — AB (ref <5.0)
Cholesterol: 201 mg/dL — ABNORMAL HIGH (ref <200)
HDL: 40 mg/dL — AB (ref >40)
LDL CALC: 96 mg/dL (ref <100)
TRIGLYCERIDES: 327 mg/dL — AB (ref <150)
VLDL: 65 mg/dL — AB (ref <30)

## 2016-06-05 LAB — HEMOGLOBIN A1C
Hgb A1c MFr Bld: 6 % — ABNORMAL HIGH (ref <5.7)
MEAN PLASMA GLUCOSE: 126 mg/dL

## 2016-06-05 LAB — PSA: PSA: 2 ng/mL (ref <=4.0)

## 2016-06-05 LAB — MICROALBUMIN / CREATININE URINE RATIO
Creatinine, Urine: 84 mg/dL (ref 20–370)
Microalb Creat Ratio: 44 ug/mg{creat} — ABNORMAL HIGH
Microalb, Ur: 3.7 mg/dL

## 2016-06-05 LAB — INSULIN, RANDOM: Insulin: 40.6 u[IU]/mL — ABNORMAL HIGH (ref 2.0–19.6)

## 2016-06-05 LAB — VITAMIN D 25 HYDROXY (VIT D DEFICIENCY, FRACTURES): Vit D, 25-Hydroxy: 51 ng/mL (ref 30–100)

## 2016-06-05 LAB — MAGNESIUM: Magnesium: 2.2 mg/dL (ref 1.5–2.5)

## 2016-06-05 LAB — TSH: TSH: 1.05 mIU/L (ref 0.40–4.50)

## 2016-07-15 DIAGNOSIS — L409 Psoriasis, unspecified: Secondary | ICD-10-CM | POA: Diagnosis not present

## 2016-07-15 DIAGNOSIS — Z5181 Encounter for therapeutic drug level monitoring: Secondary | ICD-10-CM | POA: Diagnosis not present

## 2016-07-15 DIAGNOSIS — L4050 Arthropathic psoriasis, unspecified: Secondary | ICD-10-CM | POA: Diagnosis not present

## 2016-07-15 DIAGNOSIS — L601 Onycholysis: Secondary | ICD-10-CM | POA: Diagnosis not present

## 2016-07-15 DIAGNOSIS — L219 Seborrheic dermatitis, unspecified: Secondary | ICD-10-CM | POA: Diagnosis not present

## 2016-09-04 ENCOUNTER — Ambulatory Visit (INDEPENDENT_AMBULATORY_CARE_PROVIDER_SITE_OTHER): Payer: Medicare Other | Admitting: Internal Medicine

## 2016-09-04 ENCOUNTER — Encounter: Payer: Self-pay | Admitting: Internal Medicine

## 2016-09-04 VITALS — BP 126/72 | HR 70 | Temp 97.8°F | Resp 16 | Ht 68.5 in | Wt 204.0 lb

## 2016-09-04 DIAGNOSIS — E782 Mixed hyperlipidemia: Secondary | ICD-10-CM | POA: Diagnosis not present

## 2016-09-04 DIAGNOSIS — Z23 Encounter for immunization: Secondary | ICD-10-CM | POA: Diagnosis not present

## 2016-09-04 DIAGNOSIS — D239 Other benign neoplasm of skin, unspecified: Secondary | ICD-10-CM

## 2016-09-04 DIAGNOSIS — Z0001 Encounter for general adult medical examination with abnormal findings: Secondary | ICD-10-CM

## 2016-09-04 DIAGNOSIS — E669 Obesity, unspecified: Secondary | ICD-10-CM | POA: Diagnosis not present

## 2016-09-04 DIAGNOSIS — E349 Endocrine disorder, unspecified: Secondary | ICD-10-CM

## 2016-09-04 DIAGNOSIS — Z0000 Encounter for general adult medical examination without abnormal findings: Secondary | ICD-10-CM

## 2016-09-04 DIAGNOSIS — R945 Abnormal results of liver function studies: Secondary | ICD-10-CM

## 2016-09-04 DIAGNOSIS — L4050 Arthropathic psoriasis, unspecified: Secondary | ICD-10-CM

## 2016-09-04 DIAGNOSIS — Z79899 Other long term (current) drug therapy: Secondary | ICD-10-CM | POA: Diagnosis not present

## 2016-09-04 DIAGNOSIS — I10 Essential (primary) hypertension: Secondary | ICD-10-CM

## 2016-09-04 DIAGNOSIS — Z6831 Body mass index (BMI) 31.0-31.9, adult: Secondary | ICD-10-CM | POA: Diagnosis not present

## 2016-09-04 DIAGNOSIS — E119 Type 2 diabetes mellitus without complications: Secondary | ICD-10-CM

## 2016-09-04 DIAGNOSIS — M810 Age-related osteoporosis without current pathological fracture: Secondary | ICD-10-CM

## 2016-09-04 DIAGNOSIS — R6889 Other general symptoms and signs: Secondary | ICD-10-CM | POA: Diagnosis not present

## 2016-09-04 DIAGNOSIS — R7989 Other specified abnormal findings of blood chemistry: Secondary | ICD-10-CM | POA: Diagnosis not present

## 2016-09-04 DIAGNOSIS — E559 Vitamin D deficiency, unspecified: Secondary | ICD-10-CM

## 2016-09-04 DIAGNOSIS — Z6830 Body mass index (BMI) 30.0-30.9, adult: Secondary | ICD-10-CM

## 2016-09-04 DIAGNOSIS — Z86010 Personal history of colonic polyps: Secondary | ICD-10-CM | POA: Diagnosis not present

## 2016-09-04 DIAGNOSIS — D229 Melanocytic nevi, unspecified: Secondary | ICD-10-CM

## 2016-09-04 NOTE — Progress Notes (Signed)
MEDICARE ANNUAL WELLNESS VISIT AND FOLLOW UP Assessment:   1. Essential hypertension -well controlled -currently diet controlled with amlodipine -dash diet -exercise as tolerated -monitor at home  2. Diabetes mellitus without complication ([place]) -well controlled -cont diet and exercise  3. Multiple atypical nevi -followed by dermatology  4. Dysplastic nevi -followed by dermatology  5. Osteoporosis without current pathological fracture, unspecified osteoporosis type -cont impact exercise -cont calcium and vitamin D  6. Psoriatic arthritis ([place]) -cont consentyx  7. BMI 31.56,  adult -cont diet and exercise  8. Elevated LFTs -monitor LFTs  9. Encounter for Medicare annual wellness exam -due next year  34. Mixed hyperlipidemia -cont diet and exercise -cont fenofibrate   11. Hx of colonic polyp -followed by GI  12. Medication management   13. Class 1 obesity without serious comorbidity with body mass index (BMI) of 30.0 to 30.9 in adult, unspecified obesity type -cont diet and exercise  14. Testosterone deficiency -not currently on replacement -cont diet and exercise  15. Vitamin D deficiency -Vit D  16. Need for prophylactic vaccination against Streptococcus pneumoniae (pneumococcus)  - Pneumococcal conjugate vaccine 13-valent    Over 30 minutes of exam, counseling, chart review, and critical decision making was performed  Future Appointments Date Time Provider [place]  12/10/2016 11:00 AM Unk Pinto, MD [facility] None  07/08/2017 3:00 PM Unk Pinto, MD [facility] None     Plan:   During the course of the visit the patient was educated and counseled about appropriate screening and preventive services including:    Pneumococcal vaccine   Influenza vaccine  Prevnar 13  Td vaccine  Screening electrocardiogram  Colorectal cancer screening  Diabetes screening  Glaucoma screening  Nutrition counseling     Subjective:  Melvin Shelton is a 71 y.o. male who presents for Medicare Annual Wellness Visit and 3 month follow up for HTN, hyperlipidemia, prediabetes, and vitamin D Def.   His blood pressure has been controlled at home, today their BP is BP: 126/72 He does not workout. He denies chest pain, shortness of breath, dizziness. He is about to head out to the road again soon.     He is on cholesterol medication and denies myalgias. His cholesterol is not at goal. The cholesterol last visit was:   Lab Results  Component Value Date   CHOL 201 (H) 06/04/2016   HDL 40 (L) 06/04/2016   LDLCALC 96 06/04/2016   TRIG 327 (H) 06/04/2016   CHOLHDL 5.0 (H) 06/04/2016   Patient reports that he is working on his diet.  He is also walking his dogs really often.     Lab Results  Component Value Date   HGBA1C 6.0 (H) 06/04/2016   Last GFR Lab Results  Component Value Date   GFRNONAA 79 06/04/2016     Lab Results  Component Value Date   GFRAA >89 06/04/2016   Patient is on Vitamin D supplement.   Lab Results  Component Value Date   VD25OH 85 06/04/2016     He reports that he has been doing the cosentyx.  He reports that this has been very successful.  He reports that his psorasis has improved dramatically.  He reports that he has some discoloration but the rash itself has improved dramatically.  He has no real side effects to speak of.  He does have some mild skin dryness.  He is using an OTC moisturizer.    Medication Review: Current Outpatient Prescriptions on File Prior to Visit  Medication  Sig Dispense Refill  . ALPRAZolam (XANAX) 0.5 MG tablet TAKE 1 TABLET BY MOUTH THREE TIMES DAILY AS NEEDED 270 tablet 0  . amLODipine (NORVASC) 5 MG tablet TAKE 1 TABLET(5 MG) BY MOUTH DAILY 90 tablet 0  . aspirin 81 MG tablet Take 81 mg by mouth daily.    . Calcium Carbonate (CALCIUM 600 PO) Take by mouth daily.    . Cholecalciferol (VITAMIN D3) 5000 UNITS TABS t tab  2 x day    . COSENTYX  SENSOREADY 300 DOSE 150 MG/ML SOAJ     . dexamethasone (DECADRON) 0.75 MG tablet TAKE 1/2 TO 1 TABLET BY MOUTH EVERY DAY 90 tablet 1  . fenofibrate 160 MG tablet TAKE 1 TABLET(160 MG) BY MOUTH DAILY 90 tablet 0  . Magnesium 500 MG CAPS Take 500 mg by mouth daily.    . Omega-3 Fatty Acids (FISH OIL) 1200 MG CAPS Take by mouth 3 (three) times daily.    . penicillin v potassium (VEETID) 500 MG tablet TAKE 1 TABLET BY MOUTH FOUR TIMES DAILY 100 tablet 0   No current facility-administered medications on file prior to visit.     Allergies: No Known Allergies  Current Problems (verified) has Hypertension; Psoriatic arthritis ([place]); Hyperlipidemia; Testosterone deficiency; Osteoporosis; Elevated LFTs; Hx of colonic polyp; Medication management; Vitamin D deficiency; Obesity; Diabetes mellitus without complication ([place]); BMI 65.03,  adult; Encounter for Medicare annual wellness exam; Multiple atypical nevi; and Dysplastic nevi on his problem list.  Screening Tests Immunization History  Administered Date(s) Administered  . DT 03/17/2014  . PPD Test 07/19/2015  . Pneumococcal Conjugate-13 09/04/2016  . Pneumococcal-Unspecified 06/04/2003    Preventative care: Last colonoscopy: 2011  Prior vaccinations: DT: 2015  Names of Other Physician/Practitioners you currently use: 1. [facility] here for primary care 2. Has not gotten checked lately, eye doctor, last visit 2016 3. Does not see, dentist, last visit 2016 Patient Care Team: Unk Pinto, MD as PCP - General (Internal Medicine) Inda Castle, MD as Consulting Physician (Gastroenterology) Orville Govern, MD as Consulting Physician  Surgical: He  has no past surgical history on file. Family His family history includes Cancer in his father and mother; Diabetes in his father; Heart disease in his father; Hypertension in his father. Social history  He reports that he has never smoked. He has  never used smokeless tobacco. He reports that he drinks alcohol. His drug history is not on file.  MEDICARE WELLNESS OBJECTIVES: Physical activity: Current Exercise Habits: Home exercise routine, Type of exercise: walking, Time (Minutes): 40, Frequency (Times/Week): 6, Weekly Exercise (Minutes/Week): 240, Intensity: Moderate, Exercise limited by: None identified Cardiac risk factors: Cardiac Risk Factors include: advanced age (>70mn, >>17women);dyslipidemia;hypertension Depression/mood screen:   [facility]  Decreased Interest 0  Down, Depressed, Hopeless 0  PHQ - 2 Score 0    ADLs:  In your present state of health, do you have any difficulty performing the following activities: 09/04/2016 06/04/2016  Hearing? N N  Vision? N N  Difficulty concentrating or making decisions? N N  Walking or climbing stairs? N N  Dressing or bathing? N N  Doing errands, shopping? N -  Preparing Food and eating ? N -  Using the Toilet? N -  In the past six months, have you accidently leaked urine? N -  Do you have problems with loss of bowel control? N -  Managing your Medications? N -  Managing your Finances? N -  Housekeeping or managing your Housekeeping? N -  Some recent data might be hidden     Cognitive Testing  Alert? Yes  Normal Appearance?Yes  Oriented to person? Yes  Place? Yes   Time? Yes  Recall of three objects?  Yes  Can perform simple calculations? Yes  Displays appropriate judgment?Yes  Can read the correct time from a watch face?Yes  EOL planning: Does Patient Have a Medical Advance Directive?: No Type of Advance Directive: [place] Does patient want to make changes to medical advance directive?: Yes (MAU/Ambulatory/Procedural Areas - Information given) Copy of [place] in Chart?: No - copy requested   Objective:   Today's Vitals   09/04/16 1125  BP: 126/72  Pulse: 70  Resp: 16  Temp: 97.8 F (36.6 C)   TempSrc: Temporal  Weight: 204 lb (92.5 kg)  Height: 5' 8.5" (1.74 m)   Body mass index is 30.57 kg/m.  Wt Readings from Last 3 Encounters:  09/04/16 204 lb (92.5 kg)  06/04/16 206 lb 6.4 oz (93.6 kg)  01/29/16 202 lb (91.6 kg)    General appearance: alert, no distress, WD/WN, male HEENT: normocephalic, sclerae anicteric, TMs pearly, nares patent, no discharge or erythema, pharynx normal Oral cavity: MMM, no lesions Neck: supple, no lymphadenopathy, no thyromegaly, no masses Heart: RRR, normal S1, S2, no murmurs Lungs: CTA bilaterally, no wheezes, rhonchi, or rales Abdomen: +bs, soft, non tender, non distended, no masses, no hepatomegaly, no splenomegaly Musculoskeletal: nontender, no swelling, no obvious deformity Extremities: no edema, no cyanosis, no clubbing Pulses: 2+ symmetric, upper and lower extremities, normal cap refill Neurological: alert, oriented x 3, CN2-12 intact, strength normal upper extremities and lower extremities, sensation normal throughout, DTRs 2+ throughout, no cerebellar signs, gait normal Psychiatric: normal affect, behavior normal, pleasant   Medicare Attestation I have personally reviewed: The patient's medical and social history Their use of alcohol, tobacco or illicit drugs Their current medications and supplements The patient's functional ability including ADLs,fall risks, home safety risks, cognitive, and hearing and visual impairment Diet and physical activities Evidence for depression or mood disorders  The patient's weight, height, BMI, and visual acuity have been recorded in the chart.  I have made referrals, counseling, and provided education to the patient based on review of the above and I have provided the patient with a written personalized care plan for preventive services.     Starlyn Skeans, PA-C   09/05/2016

## 2016-09-24 ENCOUNTER — Encounter: Payer: Self-pay | Admitting: Internal Medicine

## 2016-09-24 ENCOUNTER — Ambulatory Visit (INDEPENDENT_AMBULATORY_CARE_PROVIDER_SITE_OTHER): Payer: Medicare Other | Admitting: Internal Medicine

## 2016-09-24 VITALS — BP 140/80 | HR 80 | Temp 97.5°F | Resp 16 | Ht 69.0 in | Wt 205.8 lb

## 2016-09-24 DIAGNOSIS — Q819 Epidermolysis bullosa, unspecified: Secondary | ICD-10-CM | POA: Diagnosis not present

## 2016-09-24 NOTE — Progress Notes (Signed)
  Subjective:    Patient ID: CAROLINE MATTERS, male    DOB: 1945/11/09, 71 y.o.   MRN: [id number]  HPI   This nice 71 yo MWM with hx/o psoriasis/psoriatic Arthritis now on Cosentyx monthly injections presents with a 48 hr hx/o of several hemorrhagic blisters of dorsal hands, L>R. Denies any recent exposures or trauma and he also denies any pain.  Medication Sig  . ALPRAZolam (XANAX) 0.5 MG tablet TAKE 1 TABLET BY MOUTH THREE TIMES DAILY AS NEEDED  . amLODipine (NORVASC) 5 MG tablet TAKE 1 TABLET(5 MG) BY MOUTH DAILY  . aspirin 81 MG tablet Take 81 mg by mouth daily.  . Calcium Carbonate (CALCIUM 600 PO) Take by mouth daily.  . Cholecalciferol (VITAMIN D3) 5000 UNITS TABS t tab  2 x day  . COSENTYX SENSOREADY 300 DOSE 150 MG/ML SOAJ   . dexamethasone (DECADRON) 0.75 MG tablet TAKE 1/2 TO 1 TABLET BY MOUTH EVERY DAY  . fenofibrate 160 MG tablet TAKE 1 TABLET(160 MG) BY MOUTH DAILY  . Magnesium 500 MG CAPS Take 500 mg by mouth daily.  . Omega-3 Fatty Acids (FISH OIL) 1200 MG CAPS Take by mouth 3 (three) times daily.  . penicillin v potassium (VEETID) 500 MG tablet TAKE 1 TABLET BY MOUTH FOUR TIMES DAILY   No Known Allergies   Past Medical History:  Diagnosis Date  . Elevated LFTs   . Hx of colonic polyp   . Hyperlipidemia   . Hypertension   . Hypogonadism male   . Osteoporosis   . Psoriatic arthritis ([place])   . Renal glycosuria ([place])    Review of Systems  10 point systems review negative except as above.    Objective:   Physical Exam  BP 140/80   Pulse 80   Temp 97.5 F (36.4 C)   Resp 16   Ht 5' 9"  (1.753 m)   Wt 205 lb 12.8 oz (93.4 kg)   BMI 30.39 kg/m   HEENT - WNL and N/O/P clear.  Neck - Supple Chest - Clear equal BS. Cor - RRR w/o sig M.  MS & N - Unremarkable. Skin - several 1-1.5 cm hemorrhagic bulla over the dorsal hand and fingers. No sign of cellulitis or infection    Assessment & Plan:   1. Epidermolysis bullosa  - pathophysiology ambiguities  discussed with patient as well as hygienic wound care post disruption. Patient advised  to call if develop progressive lesions. Also advised to schedule f/u with his Dermatologist, Dr Lyman Speller in [place].   Over 20 minutes of exam, counseling, chart review and patient education/counseling was performed

## 2016-09-26 ENCOUNTER — Other Ambulatory Visit: Payer: Self-pay | Admitting: Physician Assistant

## 2016-09-26 ENCOUNTER — Other Ambulatory Visit: Payer: Self-pay | Admitting: Internal Medicine

## 2016-09-26 DIAGNOSIS — F419 Anxiety disorder, unspecified: Secondary | ICD-10-CM

## 2016-09-26 DIAGNOSIS — E782 Mixed hyperlipidemia: Secondary | ICD-10-CM

## 2016-09-26 NOTE — Telephone Encounter (Signed)
Please call Alpraz  

## 2016-09-27 ENCOUNTER — Encounter: Payer: Self-pay | Admitting: Internal Medicine

## 2016-12-09 ENCOUNTER — Encounter: Payer: Self-pay | Admitting: Internal Medicine

## 2016-12-09 NOTE — Progress Notes (Signed)
This very nice 71 y.o. MWM presents for 6  month follow up with Hypertension, Hyperlipidemia, Pre-Diabetes and Vitamin D Deficiency. 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 Apparently his Psoriasis - cutaneous & Arthritic is in Remission since on Cosentyx     Patient is treated for HTN (2013) & BP has been controlled at home. Today's BP is at goal - 128/84. Patient has had no complaints of any cardiac type chest pain, palpitations, dyspnea/orthopnea/PND, dizziness, claudication, or dependent edema.     Hyperlipidemia is controlled with diet & meds. Patient denies myalgias or other med SE's. Last Lipids were at goal:  Lab Results  Component Value Date   CHOL 201 (H) 06/04/2016   HDL 40 (L) 06/04/2016   LDLCALC 96 06/04/2016   TRIG 327 (H) 06/04/2016   CHOLHDL 5.0 (H) 06/04/2016      Also, the patient has history of Morbid Obesity (BMI 30+) and PreDiabetes in 2005 and then A1c 6.5% in 2012 and then 5.6% in May 2017.  He denies symptoms of reactive hypoglycemia, diabetic polys, paresthesias or visual blurring.  Last A1c was still not at goal: Lab Results  Component Value Date   HGBA1C 6.0 (H) 06/04/2016      Further, the patient also has history of Vitamin D Deficiency and supplements vitamin D without any suspected side-effects. Last vitamin D was   Lab Results  Component Value Date   VD25OH 78 06/04/2016   Current Outpatient Prescriptions on File Prior to Visit  Medication Sig  . ALPRAZolam (XANAX) 0.5 MG tablet TAKE 1 TABLET BY MOUTH THREE TIMES DAILY AS NEEDED  . amLODipine (NORVASC) 5 MG tablet TAKE 1 TABLET(5 MG) BY MOUTH DAILY  . amoxicillin-clavulanate (AUGMENTIN) 875-125 MG tablet TAKE 1 TABLET BY MOUTH TWICE DAILY UNTIL ALL TAKEN  . aspirin 81 MG tablet Take 81 mg by mouth daily.  . Calcium Carbonate (CALCIUM 600 PO) Take by mouth daily.  . Cholecalciferol (VITAMIN D3) 5000 UNITS  TABS t tab  2 x day  . COSENTYX SENSOREADY 300 DOSE 150 MG/ML SOAJ   . dexamethasone (DECADRON) 0.75 MG tablet TAKE 1/2 TO 1 TABLET BY MOUTH EVERY DAY  . fenofibrate 160 MG tablet TAKE 1 TABLET(160 MG) BY MOUTH DAILY  . Magnesium 500 MG CAPS Take 500 mg by mouth daily.  . Omega-3 Fatty Acids (FISH OIL) 1200 MG CAPS Take by mouth 3 (three) times daily.  . penicillin v potassium (VEETID) 500 MG tablet TAKE 1 TABLET BY MOUTH FOUR TIMES DAILY   No current facility-administered medications on file prior to visit.    No Known Allergies   PMHx:   Past Medical History:  Diagnosis Date  . Elevated LFTs   . Hx of colonic polyp   . Hyperlipidemia   . Hypertension   . Hypogonadism male   . Osteoporosis   . Psoriatic arthritis ([place])   . Renal glycosuria ([place])    Immunization History  Administered Date(s) Administered  . DT 03/17/2014  . PPD Test 07/19/2015  . Pneumococcal Conjugate-13 09/04/2016  . Pneumococcal-Unspecified 06/04/2003   FHx:    Reviewed / unchanged  SHx:    Reviewed / unchanged  Systems Review:  Constitutional: Denies fever, chills, wt changes, headaches, insomnia, fatigue, night sweats, change in appetite. Eyes: Denies redness, blurred vision, diplopia, discharge, itchy, watery eyes.  ENT: Denies discharge, congestion, post nasal drip, epistaxis, sore throat, earache, hearing loss,  dental pain, tinnitus, vertigo, sinus pain, snoring.  CV: Denies chest pain, palpitations, irregular heartbeat, syncope, dyspnea, diaphoresis, orthopnea, PND, claudication or edema. Respiratory: denies cough, dyspnea, DOE, pleurisy, hoarseness, laryngitis, wheezing.  Gastrointestinal: Denies dysphagia, odynophagia, heartburn, reflux, water brash, abdominal pain or cramps, nausea, vomiting, bloating, diarrhea, constipation, hematemesis, melena, hematochezia  or hemorrhoids. Genitourinary: Denies dysuria, frequency, urgency, nocturia, hesitancy, discharge, hematuria or flank  pain. Musculoskeletal: Denies arthralgias, myalgias, stiffness, jt. swelling, pain, limping or strain/sprain.  Skin: Denies pruritus, rash, hives, warts, acne, eczema or change in skin lesion(s). Neuro: No weakness, tremor, incoordination, spasms, paresthesia or pain. Psychiatric: Denies confusion, memory loss or sensory loss. Endo: Denies change in weight, skin or hair change.  Heme/Lymph: No excessive bleeding, bruising or enlarged lymph nodes.  Physical Exam  BP 128/84   Pulse 72   Temp (!) 97.5 F (36.4 C)   Resp 16   Ht 5' 9"  (1.753 m)   Wt 203 lb 9.6 oz (92.4 kg)   BMI 30.07 kg/m   Appears well nourished, well groomed  and in no distress.  Eyes: PERRLA, EOMs, conjunctiva no swelling or erythema. Sinuses: No frontal/maxillary tenderness ENT/Mouth: EAC's clear, TM's nl w/o erythema, bulging. Nares clear w/o erythema, swelling, exudates. Oropharynx clear without erythema or exudates. Oral hygiene is good. Tongue normal, non obstructing. Hearing intact.  Neck: Supple. Thyroid nl. Car 2+/2+ without bruits, nodes or JVD. Chest: Respirations nl with BS clear & equal w/o rales, rhonchi, wheezing or stridor.  Cor: Heart sounds normal w/ regular rate and rhythm without sig. murmurs, gallops, clicks or rubs. Peripheral pulses normal and equal  without edema.  Abdomen: Soft & bowel sounds normal. Non-tender w/o guarding, rebound, hernias, masses or organomegaly.  Lymphatics: Unremarkable.  Musculoskeletal: Full ROM all peripheral extremities, joint stability, 5/5 strength and normal gait.  Skin: Warm, dry without exposed rashes, lesions or ecchymosis apparent.  Neuro: Cranial nerves intact, reflexes equal bilaterally. Sensory-motor testing grossly intact. Tendon reflexes grossly intact.  Pysch: Alert & oriented x 3.  Insight and judgement nl & appropriate. No ideations.  Assessment and Plan:  1. Essential hypertension  - Continue medication, monitor blood pressure at home.  -  Continue DASH diet. Reminder to go to the ER if any CP,  SOB, nausea, dizziness, severe HA, changes vision/speech.  - CBC with Differential/Platelet - BASIC METABOLIC PANEL WITH GFR - Magnesium - TSH  2. Hyperlipidemia, mixed  - Continue diet/meds, exercise,& lifestyle modifications.    - Hepatic function panel - Lipid panel - TSH  3. Type 2 diabetes mellitus with stage 2 chronic kidney disease, without long-term current use of insulin (HCC)  - Hemoglobin A1c - Insulin, random  4. Vitamin D deficiency  - Continue diet, exercise, lifestyle modifications.  - Monitor appropriate labs. - Continue supplementation. - VITAMIN D 25 Hydroxy  5. Psoriatic arthritis ([place])   6. Medication management  - CBC with Differential/Platelet - BASIC METABOLIC PANEL WITH GFR - Hepatic function panel - Magnesium - Lipid panel - TSH - Hemoglobin A1c - Insulin, random - VITAMIN D 25 Hydroxy        Discussed  regular exercise, BP monitoring, weight control to achieve/maintain BMI less than 25 and discussed med and SE's. Recommended labs to assess and monitor clinical status with further disposition pending results of labs. Over 30 minutes of exam, counseling, chart review was performed.

## 2016-12-09 NOTE — Patient Instructions (Signed)

## 2016-12-10 ENCOUNTER — Ambulatory Visit (INDEPENDENT_AMBULATORY_CARE_PROVIDER_SITE_OTHER): Payer: Medicare Other | Admitting: Internal Medicine

## 2016-12-10 ENCOUNTER — Encounter: Payer: Self-pay | Admitting: Internal Medicine

## 2016-12-10 VITALS — BP 128/84 | HR 72 | Temp 97.5°F | Resp 16 | Ht 69.0 in | Wt 203.6 lb

## 2016-12-10 DIAGNOSIS — Z79899 Other long term (current) drug therapy: Secondary | ICD-10-CM

## 2016-12-10 DIAGNOSIS — I10 Essential (primary) hypertension: Secondary | ICD-10-CM

## 2016-12-10 DIAGNOSIS — E782 Mixed hyperlipidemia: Secondary | ICD-10-CM

## 2016-12-10 DIAGNOSIS — E559 Vitamin D deficiency, unspecified: Secondary | ICD-10-CM

## 2016-12-10 DIAGNOSIS — N182 Chronic kidney disease, stage 2 (mild): Secondary | ICD-10-CM

## 2016-12-10 DIAGNOSIS — L4050 Arthropathic psoriasis, unspecified: Secondary | ICD-10-CM | POA: Diagnosis not present

## 2016-12-10 DIAGNOSIS — E1122 Type 2 diabetes mellitus with diabetic chronic kidney disease: Secondary | ICD-10-CM

## 2016-12-10 LAB — CBC WITH DIFFERENTIAL/PLATELET
BASOS PCT: 1 %
Basophils Absolute: 72 cells/uL (ref 0–200)
EOS ABS: 216 {cells}/uL (ref 15–500)
Eosinophils Relative: 3 %
HCT: 47.5 % (ref 38.5–50.0)
Hemoglobin: 16.5 g/dL (ref 13.2–17.1)
Lymphocytes Relative: 30 %
Lymphs Abs: 2160 cells/uL (ref 850–3900)
MCH: 31.5 pg (ref 27.0–33.0)
MCHC: 34.7 g/dL (ref 32.0–36.0)
MCV: 90.8 fL (ref 80.0–100.0)
MONO ABS: 576 {cells}/uL (ref 200–950)
MPV: 10.4 fL (ref 7.5–12.5)
Monocytes Relative: 8 %
NEUTROS ABS: 4176 {cells}/uL (ref 1500–7800)
Neutrophils Relative %: 58 %
PLATELETS: 364 10*3/uL (ref 140–400)
RBC: 5.23 MIL/uL (ref 4.20–5.80)
RDW: 13.9 % (ref 11.0–15.0)
WBC: 7.2 10*3/uL (ref 3.8–10.8)

## 2016-12-11 LAB — LIPID PANEL
Cholesterol: 182 mg/dL (ref <200)
HDL: 34 mg/dL — ABNORMAL LOW (ref >40)
LDL Cholesterol: 81 mg/dL (ref <100)
Total CHOL/HDL Ratio: 5.4 ratio — ABNORMAL HIGH (ref <5.0)
Triglycerides: 334 mg/dL — ABNORMAL HIGH (ref <150)
VLDL: 67 mg/dL — ABNORMAL HIGH (ref <30)

## 2016-12-11 LAB — HEPATIC FUNCTION PANEL
ALT: 40 U/L (ref 9–46)
AST: 30 U/L (ref 10–35)
Albumin: 4.1 g/dL (ref 3.6–5.1)
Alkaline Phosphatase: 42 U/L (ref 40–115)
Bilirubin, Direct: 0.1 mg/dL (ref <=0.2)
Indirect Bilirubin: 0.4 mg/dL (ref 0.2–1.2)
Total Bilirubin: 0.5 mg/dL (ref 0.2–1.2)
Total Protein: 7.3 g/dL (ref 6.1–8.1)

## 2016-12-11 LAB — TSH: TSH: 1.48 m[IU]/L (ref 0.40–4.50)

## 2016-12-11 LAB — BASIC METABOLIC PANEL WITH GFR
BUN: 13 mg/dL (ref 7–25)
CHLORIDE: 104 mmol/L (ref 98–110)
CO2: 21 mmol/L (ref 20–31)
Calcium: 9.3 mg/dL (ref 8.6–10.3)
Creat: 1.03 mg/dL (ref 0.70–1.25)
GFR, EST AFRICAN AMERICAN: 85 mL/min (ref >=60)
GFR, Est Non African American: 74 mL/min (ref >=60)
Glucose, Bld: 124 mg/dL — ABNORMAL HIGH (ref 65–99)
POTASSIUM: 4.2 mmol/L (ref 3.5–5.3)
SODIUM: 138 mmol/L (ref 135–146)

## 2016-12-11 LAB — HEMOGLOBIN A1C
Hgb A1c MFr Bld: 5.6 % (ref <5.7)
Mean Plasma Glucose: 114 mg/dL

## 2016-12-11 LAB — VITAMIN D 25 HYDROXY (VIT D DEFICIENCY, FRACTURES): VIT D 25 HYDROXY: 62 ng/mL (ref 30–100)

## 2016-12-11 LAB — INSULIN, RANDOM: Insulin: 23.1 u[IU]/mL — ABNORMAL HIGH (ref 2.0–19.6)

## 2016-12-11 LAB — MAGNESIUM: Magnesium: 2.2 mg/dL (ref 1.5–2.5)

## 2016-12-18 ENCOUNTER — Encounter: Payer: Self-pay | Admitting: Internal Medicine

## 2017-01-22 ENCOUNTER — Ambulatory Visit (INDEPENDENT_AMBULATORY_CARE_PROVIDER_SITE_OTHER): Payer: Medicare Other

## 2017-01-22 DIAGNOSIS — Z111 Encounter for screening for respiratory tuberculosis: Secondary | ICD-10-CM

## 2017-01-22 NOTE — Progress Notes (Signed)
Pt presents for PPD. Injection giving in left arm [place]. Pt was informed to have PPD read by Friday AUG 24 th 2018. Pt voiced understanding & checked out.

## 2017-01-24 ENCOUNTER — Encounter: Payer: Self-pay | Admitting: Internal Medicine

## 2017-01-29 LAB — TB SKIN TEST
Induration: 0 mm
TB Skin Test: NEGATIVE

## 2017-03-07 ENCOUNTER — Encounter: Payer: Self-pay | Admitting: Gastroenterology

## 2017-03-26 ENCOUNTER — Ambulatory Visit: Payer: Self-pay | Admitting: Physician Assistant

## 2017-03-27 ENCOUNTER — Encounter: Payer: Self-pay | Admitting: *Deleted

## 2017-03-31 NOTE — Progress Notes (Signed)
FOLLOW UP Assessment:    Essential hypertension - continue medications, DASH diet, exercise and monitor at home. Call if greater than 130/80.  -     CBC with Differential/Platelet -     BASIC METABOLIC PANEL WITH GFR -     Hepatic function panel -     TSH  Diabetes mellitus without complication ([place]) Discussed general issues about diabetes pathophysiology and management., Educational material distributed., Suggested low cholesterol diet., Encouraged aerobic exercise., Discussed foot care., Reminded to get yearly retinal exam. -     Hemoglobin A1c  Psoriatic arthritis ([place]) CONTINUE MEDICATION AND FOLLOW UP  Mixed hyperlipidemia -continue medications, check lipids, decrease fatty foods, increase activity.  -     Lipid panel   WILLING TO DO COLOGUARD WILL BE SENT TO HOUSE Future Appointments Date Time Provider [place]  07/08/2017 3:00 PM Unk Pinto, MD [facility] None    Subjective:  Melvin Shelton is a 71 y.o. male who presents for 3 month follow up for HTN, hyperlipidemia, prediabetes, and vitamin D Def.   His blood pressure has been controlled at home, he is on norvasc 64m, today their BP is BP: 136/82 He does not workout reguarly but walks some, and trying to get 10,000 steps a day. He denies chest pain, shortness of breath, dizziness.  He is not on cholesterol medication, he is off cholesterol meds.  His cholesterol is not at goal. The cholesterol last visit was:   Lab Results  Component Value Date   CHOL 182 12/10/2016   HDL 34 (L) 12/10/2016   LDLCALC 81 12/10/2016   TRIG 334 (H) 12/10/2016   CHOLHDL 5.4 (H) 12/10/2016  He has cutaneous psoriasis and has psoriatic arthritis affecting his hands. He follows with Dr. FLyman Spellerand he is on Cosentyx and it is helping.  No recent tours during winter, finally back on sleep schedule. Has history of elevated liver function test, had normal [place] 2015, Lab Results  Component Value Date   ALT 40 12/10/2016   AST 30 12/10/2016   ALKPHOS 42 12/10/2016   BILITOT 0.5 12/10/2016  He has been working on diet and exercise for diabetes A1C 09/2013 was 6.6, he has worked very hard to lower A1C, now in preDM range, he is on bASA, he is not on ACE/ARB, and denies polyuria and visual disturbances. Last A1C in the office was:  Lab Results  Component Value Date   HGBA1C 5.6 12/10/2016  Patient is on Vitamin D supplement.   Lab Results  Component Value Date   VD25OH 62 12/10/2016  BMI is Body mass index is 29.18 kg/m., he is working on diet and exercise.  Wt Readings from Last 3 Encounters:  04/01/17 197 lb 9.6 oz (89.6 kg)  12/10/16 203 lb 9.6 oz (92.4 kg)  09/24/16 205 lb 12.8 oz (93.4 kg)    Medication Review: Current Outpatient Prescriptions on File Prior to Visit  Medication Sig Dispense Refill  . ALPRAZolam (XANAX) 0.5 MG tablet TAKE 1 TABLET BY MOUTH THREE TIMES DAILY AS NEEDED 270 tablet 1  . amLODipine (NORVASC) 5 MG tablet TAKE 1 TABLET(5 MG) BY MOUTH DAILY 90 tablet 1  . aspirin 81 MG tablet Take 81 mg by mouth daily.    . Calcium Carbonate (CALCIUM 600 PO) Take by mouth daily.    . Cholecalciferol (VITAMIN D3) 5000 UNITS TABS t tab  2 x day    . COSENTYX SENSOREADY 300 DOSE 150 MG/ML SOAJ     . fenofibrate  160 MG tablet TAKE 1 TABLET(160 MG) BY MOUTH DAILY 90 tablet 1  . Magnesium 500 MG CAPS Take 500 mg by mouth daily.    . Omega-3 Fatty Acids (FISH OIL) 1200 MG CAPS Take by mouth 3 (three) times daily.    . penicillin v potassium (VEETID) 500 MG tablet TAKE 1 TABLET BY MOUTH FOUR TIMES DAILY 100 tablet 0   No current facility-administered medications on file prior to visit.     Current Problems (verified) Patient Active Problem List   Diagnosis Date Noted  . 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 Multiple atypical nevi 10/17/2015  . Encounter for Medicare annual wellness exam 07/19/2015  . Diabetes mellitus without complication ([place]) 63/94/3200  . BMI 31.56,  adult 03/22/2015  . Obesity  06/28/2014  . Medication management 03/16/2014  . Vitamin D deficiency 03/16/2014  . Hypertension   . Psoriatic arthritis ([place])   . Hyperlipidemia   . Testosterone deficiency   . Osteoporosis   . Elevated LFTs   . Hx of colonic polyp    Surgical History: reviewed and unchanged Family History: reviewed and unchanged Social History: reviewed and unchanged   Objective:   Blood pressure 136/82, pulse 72, temperature 97.7 F (36.5 C), resp. rate 18, height 5' 9"  (1.753 m), weight 197 lb 9.6 oz (89.6 kg). Body mass index is 29.18 kg/m.  General appearance: alert, no distress, WD/WN, male HEENT: normocephalic, sclerae anicteric, TMs pearly, nares patent, no discharge or erythema, pharynx normal Oral cavity: MMM, no lesions Neck: supple, no lymphadenopathy, no thyromegaly, no masses Heart: RRR, normal S1, S2, + high pitched early systolic murmur LSB Lungs: CTA bilaterally, no wheezes, rhonchi, or rales Abdomen: +bs, soft, non tender, non distended, no masses, no hepatomegaly, no splenomegaly Musculoskeletal: nontender, no swelling, no obvious deformity Extremities: no edema, no cyanosis, no clubbing Pulses: 2+ symmetric, upper and lower extremities, normal cap refill Neurological: alert, oriented x 3, CN2-12 intact, strength normal upper extremities and lower extremities, sensation normal throughout, DTRs 2+ throughout, no cerebellar signs, gait normal Psychiatric: normal affect, behavior normal, pleasant     Vicie Mutters, PA-C   04/01/2017

## 2017-04-01 ENCOUNTER — Ambulatory Visit (INDEPENDENT_AMBULATORY_CARE_PROVIDER_SITE_OTHER): Payer: Medicare Other | Admitting: Physician Assistant

## 2017-04-01 VITALS — BP 136/82 | HR 72 | Temp 97.7°F | Resp 18 | Ht 69.0 in | Wt 197.6 lb

## 2017-04-01 DIAGNOSIS — E782 Mixed hyperlipidemia: Secondary | ICD-10-CM | POA: Diagnosis not present

## 2017-04-01 DIAGNOSIS — E119 Type 2 diabetes mellitus without complications: Secondary | ICD-10-CM

## 2017-04-01 DIAGNOSIS — L4050 Arthropathic psoriasis, unspecified: Secondary | ICD-10-CM

## 2017-04-01 DIAGNOSIS — I10 Essential (primary) hypertension: Secondary | ICD-10-CM

## 2017-04-01 DIAGNOSIS — Z79899 Other long term (current) drug therapy: Secondary | ICD-10-CM | POA: Diagnosis not present

## 2017-04-01 NOTE — Patient Instructions (Signed)
Cologuard is an easy to use noninvasive colon cancer screening test based on the latest advances in stool DNA science.   Colon cancer is 3rd most diagnosed cancer and 2nd leading cause of death in both men and women 69 years of age and older despite being one of the most preventable and treatable cancers if found early.  4 of out 5 people diagnosed with colon cancer have NO prior family history.  When caught EARLY 90% of colon cancer is curable.   You have agreed to do a Cologuard screening and have declined a colonoscopy in spite of being explained the risks and benefits of the colonoscopy in detail, including cancer and death.  You will receive a short call from [place] support center at [employer], when you receive a call they will say they are from [place],  to confirm your mailing address and give you more information.  When they calll you, it will appear on the caller ID as "[employer]" or in some cases only this number will appear, [phone].   [employer] will ship your collection kit directly to you. You will collect a single stool sample in the privacy of your own home, no special preparation required. You will return the kit via [place] pre-paid shipping or pick-up, in the same box it arrived in. Then I will contact you to discuss your results after I receive them from the laboratory.   If you have any questions or concerns, Cologuard Customer Support Specialist are available 24 hours a day, 7 days a week at [phone] or go to [email or web address].

## 2017-04-02 LAB — CBC WITH DIFFERENTIAL/PLATELET
BASOS ABS: 62 {cells}/uL (ref 0–200)
Basophils Relative: 0.7 %
EOS ABS: 79 {cells}/uL (ref 15–500)
EOS PCT: 0.9 %
HCT: 48.5 % (ref 38.5–50.0)
HEMOGLOBIN: 17.5 g/dL — AB (ref 13.2–17.1)
Lymphs Abs: 2306 cells/uL (ref 850–3900)
MCH: 31.5 pg (ref 27.0–33.0)
MCHC: 36.1 g/dL — AB (ref 32.0–36.0)
MCV: 87.2 fL (ref 80.0–100.0)
MONOS PCT: 7.1 %
MPV: 10.3 fL (ref 7.5–12.5)
NEUTROS PCT: 65.1 %
Neutro Abs: 5729 cells/uL (ref 1500–7800)
PLATELETS: 362 10*3/uL (ref 140–400)
RBC: 5.56 10*6/uL (ref 4.20–5.80)
RDW: 13.3 % (ref 11.0–15.0)
TOTAL LYMPHOCYTE: 26.2 %
WBC mixed population: 625 cells/uL (ref 200–950)
WBC: 8.8 10*3/uL (ref 3.8–10.8)

## 2017-04-02 LAB — HEPATIC FUNCTION PANEL
AG RATIO: 1.3 (calc) (ref 1.0–2.5)
ALBUMIN MSPROF: 4.4 g/dL (ref 3.6–5.1)
ALT: 47 U/L — AB (ref 9–46)
AST: 33 U/L (ref 10–35)
Alkaline phosphatase (APISO): 42 U/L (ref 40–115)
BILIRUBIN TOTAL: 0.8 mg/dL (ref 0.2–1.2)
Bilirubin, Direct: 0.1 mg/dL (ref 0.0–0.2)
GLOBULIN: 3.5 g/dL (ref 1.9–3.7)
Indirect Bilirubin: 0.7 mg/dL (calc) (ref 0.2–1.2)
TOTAL PROTEIN: 7.9 g/dL (ref 6.1–8.1)

## 2017-04-02 LAB — LIPID PANEL
Cholesterol: 204 mg/dL — ABNORMAL HIGH (ref <200)
HDL: 45 mg/dL (ref >40)
LDL CHOLESTEROL (CALC): 131 mg/dL — AB
NON-HDL CHOLESTEROL (CALC): 159 mg/dL — AB (ref <130)
TRIGLYCERIDES: 166 mg/dL — AB (ref <150)
Total CHOL/HDL Ratio: 4.5 (calc) (ref <5.0)

## 2017-04-02 LAB — BASIC METABOLIC PANEL WITH GFR
BUN: 15 mg/dL (ref 7–25)
CO2: 27 mmol/L (ref 20–32)
CREATININE: 1 mg/dL (ref 0.70–1.25)
Calcium: 10 mg/dL (ref 8.6–10.3)
Chloride: 102 mmol/L (ref 98–110)
GFR, EST NON AFRICAN AMERICAN: 76 mL/min/{1.73_m2} (ref >=60)
GFR, Est African American: 89 mL/min/{1.73_m2} (ref >=60)
Glucose, Bld: 107 mg/dL — ABNORMAL HIGH (ref 65–99)
Potassium: 4.3 mmol/L (ref 3.5–5.3)
Sodium: 138 mmol/L (ref 135–146)

## 2017-04-02 LAB — HEMOGLOBIN A1C
HEMOGLOBIN A1C: 5.9 %{Hb} — AB (ref <5.7)
Mean Plasma Glucose: 123 (calc)
eAG (mmol/L): 6.8 (calc)

## 2017-04-02 LAB — TSH: TSH: 0.8 m[IU]/L (ref 0.40–4.50)

## 2017-04-19 ENCOUNTER — Other Ambulatory Visit: Payer: Self-pay | Admitting: Internal Medicine

## 2017-04-19 DIAGNOSIS — F419 Anxiety disorder, unspecified: Secondary | ICD-10-CM

## 2017-04-20 NOTE — Telephone Encounter (Signed)
Please call Alpraz  

## 2017-04-30 DIAGNOSIS — Z1211 Encounter for screening for malignant neoplasm of colon: Secondary | ICD-10-CM | POA: Diagnosis not present

## 2017-04-30 DIAGNOSIS — Z1212 Encounter for screening for malignant neoplasm of rectum: Secondary | ICD-10-CM | POA: Diagnosis not present

## 2017-05-10 LAB — COLOGUARD: Cologuard: POSITIVE

## 2017-05-15 ENCOUNTER — Encounter: Payer: Self-pay | Admitting: Physician Assistant

## 2017-05-15 DIAGNOSIS — R195 Other fecal abnormalities: Secondary | ICD-10-CM | POA: Insufficient documentation

## 2017-05-19 ENCOUNTER — Encounter: Payer: Self-pay | Admitting: Gastroenterology

## 2017-06-09 ENCOUNTER — Other Ambulatory Visit: Payer: Self-pay | Admitting: Internal Medicine

## 2017-06-09 DIAGNOSIS — E782 Mixed hyperlipidemia: Secondary | ICD-10-CM

## 2017-06-30 ENCOUNTER — Other Ambulatory Visit: Payer: Self-pay

## 2017-06-30 ENCOUNTER — Ambulatory Visit (AMBULATORY_SURGERY_CENTER): Payer: Self-pay

## 2017-06-30 VITALS — Ht 68.5 in | Wt 206.0 lb

## 2017-06-30 DIAGNOSIS — Z86010 Personal history of colonic polyps: Secondary | ICD-10-CM

## 2017-06-30 MED ORDER — NA SULFATE-K SULFATE-MG SULF 17.5-3.13-1.6 GM/177ML PO SOLN: 1.0000 | Freq: Once | ORAL | 0 refills | Status: AC

## 2017-06-30 NOTE — Progress Notes (Signed)
Denies allergies to eggs or soy products. Denies complication of anesthesia or sedation. Denies use of weight loss medication. Denies use of O2.   Emmi instructions declined.  

## 2017-07-01 ENCOUNTER — Encounter: Payer: Self-pay | Admitting: Gastroenterology

## 2017-07-08 ENCOUNTER — Encounter: Payer: Self-pay | Admitting: Internal Medicine

## 2017-07-14 ENCOUNTER — Encounter: Payer: Self-pay | Admitting: Gastroenterology

## 2017-07-14 ENCOUNTER — Ambulatory Visit (AMBULATORY_SURGERY_CENTER): Payer: Medicare Other | Admitting: Gastroenterology

## 2017-07-14 VITALS — BP 121/76 | HR 70 | Temp 97.3°F | Resp 14 | Ht 68.0 in | Wt 206.0 lb

## 2017-07-14 DIAGNOSIS — K529 Noninfective gastroenteritis and colitis, unspecified: Secondary | ICD-10-CM | POA: Diagnosis not present

## 2017-07-14 DIAGNOSIS — K5000 Crohn's disease of small intestine without complications: Secondary | ICD-10-CM | POA: Diagnosis not present

## 2017-07-14 DIAGNOSIS — R195 Other fecal abnormalities: Secondary | ICD-10-CM

## 2017-07-14 DIAGNOSIS — Z1211 Encounter for screening for malignant neoplasm of colon: Secondary | ICD-10-CM | POA: Diagnosis not present

## 2017-07-14 MED ORDER — SODIUM CHLORIDE 0.9 % IV SOLN: 500.0000 mL | Freq: Once | INTRAVENOUS | Status: DC

## 2017-07-14 NOTE — Progress Notes (Signed)
Per Dr. Loletha Carrow add to path order to r/o Crohn's, lymphoma, drug reaction (Sekukinumab), and tuberculosis.  Added to order.  maw

## 2017-07-14 NOTE — Progress Notes (Signed)
To PACU, VSS. Report to [email or web address] 

## 2017-07-14 NOTE — Patient Instructions (Signed)
YOU HAD AN ENDOSCOPIC PROCEDURE TODAY AT [facility]:   Refer to the procedure report that was given to you for any specific questions about what was found during the examination.  If the procedure report does not answer your questions, please call your gastroenterologist to clarify.  If you requested that your care partner not be given the details of your procedure findings, then the procedure report has been included in a sealed envelope for you to review at your convenience later.  YOU SHOULD EXPECT: Some feelings of bloating in the abdomen. Passage of more gas than usual.  Walking can help get rid of the air that was put into your GI tract during the procedure and reduce the bloating. If you had a lower endoscopy (such as a colonoscopy or flexible sigmoidoscopy) you may notice spotting of blood in your stool or on the toilet paper. If you underwent a bowel prep for your procedure, you may not have a normal bowel movement for a few days.  Please Note:  You might notice some irritation and congestion in your nose or some drainage.  This is from the oxygen used during your procedure.  There is no need for concern and it should clear up in a day or so.  SYMPTOMS TO REPORT IMMEDIATELY:   Following lower endoscopy (colonoscopy or flexible sigmoidoscopy):  Excessive amounts of blood in the stool  Significant tenderness or worsening of abdominal pains  Swelling of the abdomen that is new, acute  Fever of 100F or higher  For urgent or emergent issues, a gastroenterologist can be reached at any hour by calling [phone].   DIET:  We do recommend a small meal at first, but then you may proceed to your regular diet.  Drink plenty of fluids but you should avoid alcoholic beverages for 24 hours.  MEDICATIONS: Continue present medications.  Please see handouts given to you by your recovery nurse.  FOLLOW UP: Follow up in Dr. Loletha Carrow' office for an appointment for further evaluation  after we receive the pathology results.  ACTIVITY:  You should plan to take it easy for the rest of today and you should NOT DRIVE or use heavy machinery until tomorrow (because of the sedation medicines used during the test).    FOLLOW UP: Our staff will call the number listed on your records the next business day following your procedure to check on you and address any questions or concerns that you may have regarding the information given to you following your procedure. If we do not reach you, we will leave a message.  However, if you are feeling well and you are not experiencing any problems, there is no need to return our call.  We will assume that you have returned to your regular daily activities without incident.  If any biopsies were taken you will be contacted by phone or by letter within the next 1-3 weeks.  Please call us at [phone] if you have not heard about the biopsies in 3 weeks.   Thank you for allowing [place] to provide for your healthcare needs today.  SIGNATURES/CONFIDENTIALITY: You and/or your care partner have signed paperwork which will be entered into your electronic medical record.  These signatures attest to the fact that that the information above on your After Visit Summary has been reviewed and is understood.  Full responsibility of the confidentiality of this discharge information lies with you and/or your care-partner.

## 2017-07-14 NOTE — Op Note (Signed)
[place] Patient Name: Melvin Shelton Procedure Date: 07/14/2017 10:14 AM MRN: [id number] Endoscopist: [place]. Loletha Carrow , MD Age: 72 Referring MD:  Date of Birth: 1945/07/06 Gender: Male Account #: [id number] Procedure:                Colonoscopy Indications:              Positive Cologuard test Medicines:                Monitored Anesthesia Care Procedure:                Pre-Anesthesia Assessment:                           - Prior to the procedure, a History and Physical                            was performed, and patient medications and                            allergies were reviewed. The patient's tolerance of                            previous anesthesia was also reviewed. The risks                            and benefits of the procedure and the sedation                            options and risks were discussed with the patient.                            All questions were answered, and informed consent                            was obtained. Anticoagulants: The patient has taken                            aspirin. It was decided not to withhold this                            medication prior to the procedure. ASA Grade                            Assessment: II - A patient with mild systemic                            disease. After reviewing the risks and benefits,                            the patient was deemed in satisfactory condition to                            undergo the procedure.  After obtaining informed consent, the colonoscope                            was passed under direct vision. Throughout the                            procedure, the patient's blood pressure, pulse, and                            oxygen saturations were monitored continuously. The                            Colonoscope was introduced through the anus and                            advanced to the the terminal ileum. The colonoscopy                  was performed without difficulty. The patient                            tolerated the procedure well. The quality of the                            bowel preparation was good. The ileocecal valve,                            appendiceal orifice, and rectum were photographed. Scope In: 10:21:51 AM Scope Out: 10:38:31 AM Scope Withdrawal Time: 0 hours 13 minutes 17 seconds  Total Procedure Duration: 0 hours 16 minutes 40 seconds  Findings:                 The perianal and digital rectal examinations were                            normal. Specifically, no fissure or fistula was                            seen.                           The terminal ileum contained multiple ulcers with                            severe inflammation. No bleeding was present. No                            stigmata of recent bleeding were seen. Biopsies                            were taken with a cold forceps for histology.                           A few small-mouthed diverticula were found in the  sigmoid colon.                           The exam was otherwise without abnormality on                            direct and retroflexion views. Specifically, no                            colon ulcers or inflammation were seen. Complications:            No immediate complications. Estimated Blood Loss:     Estimated blood loss was minimal. Impression:               - Multiple ulcers in the terminal ileum. Biopsied.                           - Diverticulosis in the sigmoid colon.                           - The examination was otherwise normal on direct                            and retroflexion views.                           This patient does not have a prior diagnosis of                            Crohn's disease. He is on sekukinumab for                            psoriasis. Last TB skin test negative 01/2017, last                            TB Quantiferon gold test  2016 Recommendation:           - Patient has a contact number available for                            emergencies. The signs and symptoms of potential                            delayed complications were discussed with the                            patient. Return to normal activities tomorrow.                            Written discharge instructions were provided to the                            patient.                           - Resume previous diet.                           -  Continue present medications.                           - Await pathology results.                           - Repeat colonoscopy is recommended. The                            colonoscopy date will be determined after pathology                            results from today's exam become available for                            review.                           - Return to my office at appointment to be                            scheduled after biopsies complete. Sherrica Niehaus L. Loletha Carrow, MD 07/14/2017 10:51:33 AM This report has been signed electronically.

## 2017-07-14 NOTE — Progress Notes (Signed)
Pt's states no medical or surgical changes since previsit or office visit. 

## 2017-07-14 NOTE — Progress Notes (Signed)
Called to room to assist during endoscopic procedure.  Patient ID and intended procedure confirmed with present staff. Received instructions for my participation in the procedure from the performing physician.  

## 2017-07-15 ENCOUNTER — Telehealth: Payer: Self-pay

## 2017-07-15 ENCOUNTER — Encounter: Payer: Self-pay | Admitting: Gastroenterology

## 2017-07-15 NOTE — Telephone Encounter (Signed)
  Follow up Call-  Call back number 07/14/2017  Post procedure Call Back phone  # [phone]  Permission to leave phone message Yes  Some recent data might be hidden     Patient questions:  Do you have a fever, pain , or abdominal swelling? No. Pain Score  0 *  Have you tolerated food without any problems? Yes.    Have you been able to return to your normal activities? Yes.    Do you have any questions about your discharge instructions: Diet   No. Medications  Yes.   Follow up visit  No.  Do you have questions or concerns about your Care? Yes.    Actions: * If pain score is 4 or above: No action needed, pain <4.  Pt wanted me to forward this info to Dr. Loletha Carrow.  Pt is currently taking cosentyx.  He spoke with his dermatologist yesterday and he said there were other newer drugs that could be prescribed to tx psoriasis and also help with bowel inflammation.  Also pt takes occasionally liquid vit b-12.  Pt read on line in some cases this could cause bowel inflammation.  Pt wanted Dr. Loletha Carrow to be aware of this information so he would have all the info.  I explained I would route this message to Dr. Loletha Carrow.  maw

## 2017-07-22 ENCOUNTER — Encounter: Payer: Self-pay | Admitting: Gastroenterology

## 2017-07-23 ENCOUNTER — Encounter: Payer: Self-pay | Admitting: Gastroenterology

## 2017-07-23 DIAGNOSIS — L4050 Arthropathic psoriasis, unspecified: Secondary | ICD-10-CM | POA: Diagnosis not present

## 2017-07-23 DIAGNOSIS — L409 Psoriasis, unspecified: Secondary | ICD-10-CM | POA: Diagnosis not present

## 2017-07-23 DIAGNOSIS — K639 Disease of intestine, unspecified: Secondary | ICD-10-CM | POA: Diagnosis not present

## 2017-07-30 ENCOUNTER — Encounter: Payer: Self-pay | Admitting: Gastroenterology

## 2017-07-30 ENCOUNTER — Telehealth: Payer: Self-pay | Admitting: Gastroenterology

## 2017-07-30 NOTE — Telephone Encounter (Signed)
Changes have been made.

## 2017-07-30 NOTE — Telephone Encounter (Signed)
Please place colonoscopy recall for September 2019

## 2017-07-30 NOTE — Telephone Encounter (Signed)
Recall has already been placed by kristen for 07-2027. Do you want to keep that one, or change to the 02-2018. Please advise. Thank you.

## 2017-07-30 NOTE — Telephone Encounter (Signed)
Doran Heater. Lyman Speller, M.D., Ph.D.  Department of Dermatology  [place], [place]    [phone]  fax [phone]  [email or web address]   Patient left me this info, and I sent a secure email today regarding his case.

## 2017-07-30 NOTE — Telephone Encounter (Signed)
Change to 02/2018

## 2017-08-06 ENCOUNTER — Ambulatory Visit (INDEPENDENT_AMBULATORY_CARE_PROVIDER_SITE_OTHER): Payer: Medicare Other | Admitting: Internal Medicine

## 2017-08-06 ENCOUNTER — Encounter: Payer: Self-pay | Admitting: Internal Medicine

## 2017-08-06 VITALS — BP 128/74 | HR 76 | Temp 97.0°F | Resp 18 | Ht 69.75 in | Wt 207.2 lb

## 2017-08-06 DIAGNOSIS — Z0000 Encounter for general adult medical examination without abnormal findings: Secondary | ICD-10-CM | POA: Diagnosis not present

## 2017-08-06 DIAGNOSIS — E559 Vitamin D deficiency, unspecified: Secondary | ICD-10-CM

## 2017-08-06 DIAGNOSIS — N401 Enlarged prostate with lower urinary tract symptoms: Secondary | ICD-10-CM

## 2017-08-06 DIAGNOSIS — N182 Chronic kidney disease, stage 2 (mild): Secondary | ICD-10-CM

## 2017-08-06 DIAGNOSIS — Z79899 Other long term (current) drug therapy: Secondary | ICD-10-CM

## 2017-08-06 DIAGNOSIS — I10 Essential (primary) hypertension: Secondary | ICD-10-CM | POA: Diagnosis not present

## 2017-08-06 DIAGNOSIS — E1122 Type 2 diabetes mellitus with diabetic chronic kidney disease: Secondary | ICD-10-CM | POA: Diagnosis not present

## 2017-08-06 DIAGNOSIS — Z1211 Encounter for screening for malignant neoplasm of colon: Secondary | ICD-10-CM

## 2017-08-06 DIAGNOSIS — E782 Mixed hyperlipidemia: Secondary | ICD-10-CM

## 2017-08-06 DIAGNOSIS — Z125 Encounter for screening for malignant neoplasm of prostate: Secondary | ICD-10-CM

## 2017-08-06 DIAGNOSIS — Z0001 Encounter for general adult medical examination with abnormal findings: Secondary | ICD-10-CM

## 2017-08-06 DIAGNOSIS — L4050 Arthropathic psoriasis, unspecified: Secondary | ICD-10-CM

## 2017-08-06 DIAGNOSIS — Z136 Encounter for screening for cardiovascular disorders: Secondary | ICD-10-CM | POA: Diagnosis not present

## 2017-08-06 DIAGNOSIS — Z8249 Family history of ischemic heart disease and other diseases of the circulatory system: Secondary | ICD-10-CM

## 2017-08-06 DIAGNOSIS — Z1212 Encounter for screening for malignant neoplasm of rectum: Secondary | ICD-10-CM

## 2017-08-06 NOTE — Patient Instructions (Signed)

## 2017-08-06 NOTE — Progress Notes (Signed)
[facility]   Unk Pinto, M.D.     Uvaldo Bristle. Silverio Lay, P.A.-C Liane Com[place], [place]                [address] [place] [address] Telephone [phone] Telefax [phone] Annual  Screening/Preventative Visit  & Comprehensive Evaluation & Examination     This very nice 72 y.o. MWM presents for a Screening/Preventative Visit & comprehensive evaluation and management of multiple medical co-morbidities.  Patient has been followed for HTN, HLD, Prediabetes and Vitamin D Deficiency.     Patient has hx/o long standing [place] Psoriasis & Psoriatic Arthritis in remission on Cosentyx, but last month had Colonoscopy by Dr Loletha Carrow with  Bx (+) Crohn's in the TI, so Cosentyx is d/c'd and is being switched to [facility] if patient assistance can be worked out by his Dermatologist - Dr Lyman Speller.      HTN predates since 2013. Patient's BP has been controlled at home.  Today's BP is at goal - 128/74. Patient denies any cardiac symptoms as chest pain, palpitations, shortness of breath, dizziness or ankle swelling.     Patient's hyperlipidemia is controlled with diet and medications. Patient denies myalgias or other medication SE's. Last lipids were at goal with sl elevated Trig's: Lab Results  Component Value Date   CHOL 204 (H) 04/01/2017   HDL 45 04/01/2017   LDLCALC 81 12/10/2016   TRIG 166 (H) 04/01/2017   CHOLHDL 4.5 04/01/2017      Patient has Morbid Obesity (BMI 30) and prediabetes (A1c/6.5%/2012) and patient denies reactive hypoglycemic symptoms, visual blurring, diabetic polys or paresthesias. Last A1c was still not at goal: Lab Results  Component Value Date   HGBA1C 5.9 (H) 04/01/2017       Finally, patient has history of Vitamin D Deficiency and last vitamin D was at goal: Lab Results  Component Value Date   VD25OH 62 12/10/2016   Current Outpatient Medications on File Prior to Visit   Medication Sig  . amLODipine (NORVASC) 5 MG tablet TAKE 1 TAB DAILY  . amoxicillin-clavulanate  875-125 MG tablet TAKE 1 TAB TWICE DAILY UNTIL ALL TAKEN  . aspirin 81 MG tablet Take  daily.  Marland Kitchen CALCIUM 60 PO) Take  daily.  Marland Kitchen VITAMIN D 5000 UNITS  t tab  2 x day  . fenofibrate 160 MG tablet TAKE 1 TAB DAILY  . Magnesium 500 MG CAPS Take daily.  . Omega-3   FISH OIL 1200 MG  Take  3  times daily.  . Vitamin C 500 mg . One tablet daily.  . penicillin v pot 500 MG tablet TAKE 1 TAB FOUR TIMES DAILY   No Known Allergies   Past Medical History:  Diagnosis Date  . Arthritis   . Elevated LFTs   . Hx of colonic polyp   . Hyperlipidemia   . Hypertension   . Hypogonadism male   . Osteoporosis   . Psoriatic arthritis ([place])   . Renal glycosuria [facility])    Health Maintenance  Topic Date Due  . OPHTHALMOLOGY EXAM  10/09/1955  . FOOT EXAM  03/21/2016  . URINE MICROALBUMIN  06/04/2017  . INFLUENZA VACCINE  03/03/2018 (Originally 01/01/2017)  . PNA vac Low Risk Adult (2 of 2 - PPSV23) 09/04/2017  . HEMOGLOBIN A1C  09/30/2017  . TETANUS/TDAP  03/17/2024  . COLONOSCOPY  07/15/2027  .  Hepatitis C Screening  Completed   Immunization History  Administered Date(s) Administered  . DT 03/17/2014  . PPD Test 07/19/2015, 01/22/2017  . Pneumococcal Conjugate-13 09/04/2016  . Pneumococcal-Unspecified 06/04/2003   Last Colon -  Past Surgical History:  Procedure Laterality Date  . COLONOSCOPY    . POLYPECTOMY     Family History  Problem Relation Age of Onset  . Lymphoma Mother   . Heart disease Father   . Hypertension Father   . Diabetes Father   . Prostate cancer Father   . Colon cancer Neg Hx   . Esophageal cancer Neg Hx   . Liver cancer Neg Hx   . Pancreatic cancer Neg Hx   . Rectal cancer Neg Hx   . Stomach cancer Neg Hx    Socioeconomic History  . Marital status: Married    Spouse name: Not on file  . Number of children: None  Social Needs  Occupational History  .  Semi-retired jazz musician    Tobacco Use  . Smoking status: Never Smoker  . Smokeless tobacco: Never Used  Substance and Sexual Activity  . Alcohol use: Yes  . Drug use: No  . Sexual activity: Not on file    ROS Constitutional: Denies fever, chills, weight loss/gain, headaches, insomnia,  night sweats or change in appetite. Does c/o fatigue. Eyes: Denies redness, blurred vision, diplopia, discharge, itchy or watery eyes.  ENT: Denies discharge, congestion, post nasal drip, epistaxis, sore throat, earache, hearing loss, dental pain, Tinnitus, Vertigo, Sinus pain or snoring.  Cardio: Denies chest pain, palpitations, irregular heartbeat, syncope, dyspnea, diaphoresis, orthopnea, PND, claudication or edema Respiratory: denies cough, dyspnea, DOE, pleurisy, hoarseness, laryngitis or wheezing.  Gastrointestinal: Denies dysphagia, heartburn, reflux, water brash, pain, cramps, nausea, vomiting, bloating, diarrhea, constipation, hematemesis, melena, hematochezia, jaundice or hemorrhoids Genitourinary: Denies dysuria,  discharge, hematuria or flank pain. Has occas. frequency, urgency, nocturia x 2-3 & mild hesitancy. Musculoskeletal: Denies arthralgia, myalgia, stiffness, Jt. Swelling, pain, limp or strain/sprain. Denies Falls. Skin: Denies puritis, rash, hives, warts, acne, eczema or change in skin lesion Neuro: No weakness, tremor, incoordination, spasms, paresthesia or pain Psychiatric: Denies confusion, memory loss or sensory loss. Denies Depression. Endocrine: Denies change in weight, skin, hair change, nocturia, and paresthesia, diabetic polys, visual blurring or hyper / hypo glycemic episodes.  Heme/Lymph: No excessive bleeding, bruising or enlarged lymph nodes.  Physical Exam  BP 128/74   Pulse 76   Temp (!) 97 F (36.1 C)   Resp 18   Ht 5' 9.75" (1.772 m)   Wt 207 lb 3.2 oz (94 kg)   BMI 29.94 kg/m   General Appearance: Well nourished and well groomed and in no apparent  distress.  Eyes: PERRLA, EOMs, conjunctiva no swelling or erythema, normal fundi and vessels. Sinuses: No frontal/maxillary tenderness ENT/Mouth: EACs patent / TMs  nl. Nares clear without erythema, swelling, mucoid exudates. Oral hygiene is good. No erythema, swelling, or exudate. Tongue normal, non-obstructing. Tonsils not swollen or erythematous. Hearing normal.  Neck: Supple, thyroid not palpable. No bruits, nodes or JVD. Respiratory: Respiratory effort normal.  BS equal and clear bilateral without rales, rhonci, wheezing or stridor. Cardio: Heart sounds are normal with regular rate and rhythm and no murmurs, rubs or gallops. Peripheral pulses are normal and equal bilaterally without edema. No aortic or femoral bruits. Chest: symmetric with normal excursions and percussion.  Abdomen: Soft, with Nl bowel sounds. Nontender, no guarding, rebound, hernias, masses, or organomegaly.  Lymphatics: Non tender without lymphadenopathy.  Genitourinary:  DRE - deferred since recent colonoscopy Musculoskeletal: Full ROM all peripheral extremities, joint stability, 5/5 strength, and normal gait. Skin: Warm and dry without rashes, lesions, cyanosis, clubbing or  ecchymosis.  No psoriatic rash seen. Neuro: Cranial nerves intact, reflexes equal bilaterally. Normal muscle tone, no cerebellar symptoms. Sensation intact.  Pysch: Alert and oriented X 3 with normal affect, insight and judgment appropriate.   Assessment and Plan  1. Annual Preventative/Screening Exam   2. Essential hypertension  - EKG 12-Lead - [place], RETROPERITNL ABD,  LTD - Urinalysis, Routine w reflex microscopic - Microalbumin / creatinine urine ratio - CBC with Differential/Platelet - BASIC METABOLIC PANEL WITH GFR - Magnesium - TSH  3. Hyperlipidemia, mixed  - EKG 12-Lead - [place], RETROPERITNL ABD,  LTD - Hepatic function panel - Lipid panel - TSH  4. Type 2 diabetes mellitus with stage 2 chronic kidney disease, without long-term  current use of insulin (HCC)  - EKG 12-Lead - [place], RETROPERITNL ABD,  LTD - HM DIABETES FOOT EXAM - LOW EXTREMITY NEUR EXAM DOCUM - Hemoglobin A1c - Insulin, random  5. Vitamin D deficiency  - VITAMIN D 25 Hydroxy  6. Psoriatic arthritis ([place])   7. Screening for colorectal cancer  - POC Hemoccult Bld/Stl  8. Benign localized prostatic hyperplasia with lower urinary tract symptoms (LUTS)  - PSA  9. Prostate cancer screening  - PSA  10. Screening for ischemic heart disease  - EKG 12-Lead  11. FHx: heart disease  - EKG 12-Lead - [place], RETROPERITNL ABD,  LTD  12. Screening for AAA (aortic abdominal aneurysm)  - [place], RETROPERITNL ABD,  LTD  13. Medication management  - Urinalysis, Routine w reflex microscopic - Microalbumin / creatinine urine ratio - CBC with Differential/Platelet - BASIC METABOLIC PANEL WITH GFR - Hepatic function panel - Magnesium - Lipid panel - TSH - Hemoglobin A1c - Insulin, random - VITAMIN D 25 Hydroxyl         Patient was counseled in prudent diet, weight control to achieve/maintain BMI less than 25, BP monitoring, regular exercise and medications as discussed.  Discussed med effects and SE's. Routine screening labs and tests as requested with regular follow-up as recommended. Over 40 minutes of exam, counseling, chart review and high complex critical decision making was performed

## 2017-08-07 LAB — LIPID PANEL
Cholesterol: 190 mg/dL (ref <200)
HDL: 41 mg/dL (ref >40)
LDL CHOLESTEROL (CALC): 108 mg/dL — AB
Non-HDL Cholesterol (Calc): 149 mg/dL (calc) — ABNORMAL HIGH (ref <130)
TRIGLYCERIDES: 290 mg/dL — AB (ref <150)
Total CHOL/HDL Ratio: 4.6 (calc) (ref <5.0)

## 2017-08-07 LAB — URINALYSIS, ROUTINE W REFLEX MICROSCOPIC
BILIRUBIN URINE: NEGATIVE
Bacteria, UA: NONE SEEN /HPF
HGB URINE DIPSTICK: NEGATIVE
Hyaline Cast: NONE SEEN /LPF
KETONES UR: NEGATIVE
Nitrite: NEGATIVE
PH: 6 (ref 5.0–8.0)
Protein, ur: NEGATIVE
Specific Gravity, Urine: 1.022 (ref 1.001–1.03)
Squamous Epithelial / LPF: NONE SEEN /HPF (ref <=5)

## 2017-08-07 LAB — BASIC METABOLIC PANEL WITH GFR
BUN: 19 mg/dL (ref 7–25)
CHLORIDE: 104 mmol/L (ref 98–110)
CO2: 28 mmol/L (ref 20–32)
Calcium: 9.9 mg/dL (ref 8.6–10.3)
Creat: 0.97 mg/dL (ref 0.70–1.25)
GFR, EST AFRICAN AMERICAN: 92 mL/min/{1.73_m2} (ref >=60)
GFR, EST NON AFRICAN AMERICAN: 79 mL/min/{1.73_m2} (ref >=60)
Glucose, Bld: 180 mg/dL — ABNORMAL HIGH (ref 65–99)
POTASSIUM: 4.3 mmol/L (ref 3.5–5.3)
Sodium: 141 mmol/L (ref 135–146)

## 2017-08-07 LAB — MICROALBUMIN / CREATININE URINE RATIO
Creatinine, Urine: 86 mg/dL (ref 20–320)
Microalb Creat Ratio: 64 mcg/mg creat — ABNORMAL HIGH (ref <30)
Microalb, Ur: 5.5 mg/dL

## 2017-08-07 LAB — HEPATIC FUNCTION PANEL
AG Ratio: 1.4 (calc) (ref 1.0–2.5)
ALBUMIN MSPROF: 4.2 g/dL (ref 3.6–5.1)
ALT: 39 U/L (ref 9–46)
AST: 26 U/L (ref 10–35)
Alkaline phosphatase (APISO): 42 U/L (ref 40–115)
BILIRUBIN INDIRECT: 0.4 mg/dL (ref 0.2–1.2)
BILIRUBIN TOTAL: 0.5 mg/dL (ref 0.2–1.2)
Bilirubin, Direct: 0.1 mg/dL (ref 0.0–0.2)
GLOBULIN: 3.1 g/dL (ref 1.9–3.7)
Total Protein: 7.3 g/dL (ref 6.1–8.1)

## 2017-08-07 LAB — VITAMIN D 25 HYDROXY (VIT D DEFICIENCY, FRACTURES): Vit D, 25-Hydroxy: 62 ng/mL (ref 30–100)

## 2017-08-07 LAB — CBC WITH DIFFERENTIAL/PLATELET
Basophils Absolute: 59 cells/uL (ref 0–200)
Basophils Relative: 0.8 %
EOS ABS: 148 {cells}/uL (ref 15–500)
Eosinophils Relative: 2 %
HEMATOCRIT: 45.5 % (ref 38.5–50.0)
Hemoglobin: 16.1 g/dL (ref 13.2–17.1)
LYMPHS ABS: 2338 {cells}/uL (ref 850–3900)
MCH: 31.2 pg (ref 27.0–33.0)
MCHC: 35.4 g/dL (ref 32.0–36.0)
MCV: 88.2 fL (ref 80.0–100.0)
MPV: 9.7 fL (ref 7.5–12.5)
Monocytes Relative: 9 %
NEUTROS PCT: 56.6 %
Neutro Abs: 4188 cells/uL (ref 1500–7800)
PLATELETS: 375 10*3/uL (ref 140–400)
RBC: 5.16 10*6/uL (ref 4.20–5.80)
RDW: 13.3 % (ref 11.0–15.0)
TOTAL LYMPHOCYTE: 31.6 %
WBC: 7.4 10*3/uL (ref 3.8–10.8)
WBCMIX: 666 {cells}/uL (ref 200–950)

## 2017-08-07 LAB — HEMOGLOBIN A1C
EAG (MMOL/L): 7.4 (calc)
HEMOGLOBIN A1C: 6.3 %{Hb} — AB (ref <5.7)
MEAN PLASMA GLUCOSE: 134 (calc)

## 2017-08-07 LAB — INSULIN, RANDOM: Insulin: 105.9 u[IU]/mL — ABNORMAL HIGH (ref 2.0–19.6)

## 2017-08-07 LAB — MAGNESIUM: MAGNESIUM: 2.1 mg/dL (ref 1.5–2.5)

## 2017-08-07 LAB — TSH: TSH: 2.57 m[IU]/L (ref 0.40–4.50)

## 2017-08-07 LAB — PSA: PSA: 2.2 ng/mL (ref <=4.0)

## 2017-08-20 DIAGNOSIS — L709 Acne, unspecified: Secondary | ICD-10-CM | POA: Diagnosis not present

## 2017-08-20 DIAGNOSIS — L4050 Arthropathic psoriasis, unspecified: Secondary | ICD-10-CM | POA: Diagnosis not present

## 2017-08-20 DIAGNOSIS — L409 Psoriasis, unspecified: Secondary | ICD-10-CM | POA: Diagnosis not present

## 2017-09-15 DIAGNOSIS — L409 Psoriasis, unspecified: Secondary | ICD-10-CM | POA: Diagnosis not present

## 2017-09-21 ENCOUNTER — Other Ambulatory Visit: Payer: Self-pay | Admitting: Internal Medicine

## 2017-09-21 DIAGNOSIS — E782 Mixed hyperlipidemia: Secondary | ICD-10-CM

## 2017-11-06 ENCOUNTER — Other Ambulatory Visit: Payer: Self-pay | Admitting: Internal Medicine

## 2017-11-10 ENCOUNTER — Other Ambulatory Visit: Payer: Self-pay | Admitting: Internal Medicine

## 2017-11-13 ENCOUNTER — Ambulatory Visit: Payer: Self-pay | Admitting: Adult Health

## 2017-12-09 NOTE — Progress Notes (Signed)
MEDICARE ANNUAL WELLNESS VISIT AND FOLLOW UP Assessment:   Diagnoses and all orders for this visit:  Encounter for Medicare annual wellness exam  Essential hypertension Continue medication Monitor blood pressure at home; call if consistently over 130/80 Continue DASH diet.   Reminder to go to the ER if any CP, SOB, nausea, dizziness, severe HA, changes vision/speech, left arm numbness and tingling and jaw pain.  Prediabetes Discussed disease and risks Discussed diet/exercise, weight management  A1C, insulin level  Psoriatic arthritis ([place]) Followed by Dr. Lyman Speller Pending initiation of stelera  Osteoporosis without current pathological fracture, unspecified osteoporosis type Continue Vit D and Ca, weight bearing exercises  Multiple atypical nevi Followed by dermatology  Vitamin D deficiency At goal at recent check; continue to recommend supplementation for goal of 70-100 Defer vitamin D level  Mixed hyperlipidemia Continue medications Continue low cholesterol diet and exercise.  Check lipid panel.   Hx of colonic polyp UTD on colonoscopy  BMI 29.0-29.9,adult Long discussion about weight loss, diet, and exercise Recommended diet heavy in fruits and veggies and low in animal meats, cheeses, and dairy products, appropriate calorie intake Discussed appropriate weight for height  Follow up at next visit  Need for pneumonia vaccination 23 valent administered today   Over 30 minutes of exam, counseling, chart review, and critical decision making was performed  Future Appointments  Date Time Provider [place]  03/02/2018  2:30 PM Unk Pinto, MD [facility] None  09/07/2018 10:00 AM Unk Pinto, MD [facility] None     Plan:   During the course of the visit the patient was educated and counseled about appropriate screening and preventive services including:    Pneumococcal vaccine   Influenza vaccine  Prevnar 13  Td vaccine  Screening  electrocardiogram  Colorectal cancer screening  Diabetes screening  Glaucoma screening  Nutrition counseling    Subjective:  Melvin Shelton is a 72 y.o. male who presents for Medicare Annual Wellness Visit and 3 month follow up for HTN, hyperlipidemia, prediabetes, and vitamin D Def.   [name] Patient has long standing [place] Psoriasis & Psoriatic Arthritis in remission on Cosentyx, but 07/2017 had Colonoscopy by Dr Loletha Carrow with  Bx (+) Crohn's in the TI, so Cosentyx has been discontinued and is being switched to [facility] if patient assistance can be worked out by his Dermatologist - Dr Lyman Speller.   BMI is Body mass index is 28.9 kg/m., he has been working on diet and exercise. Wt Readings from Last 3 Encounters:  12/10/17 200 lb (90.7 kg)  08/06/17 207 lb 3.2 oz (94 kg)  07/14/17 206 lb (93.4 kg)   His blood pressure has been controlled at home, today their BP is BP: 122/68  He does workout. He denies chest pain, shortness of breath, dizziness.   He is on cholesterol medication and denies myalgias. His cholesterol is not at goal. The cholesterol last visit was:   Lab Results  Component Value Date   CHOL 190 08/06/2017   HDL 41 08/06/2017   LDLCALC 108 (H) 08/06/2017   TRIG 290 (H) 08/06/2017   CHOLHDL 4.6 08/06/2017    He has been working on diet and exercise for prediabetes, and denies foot ulcerations, increased appetite, nausea, paresthesia of the feet, polydipsia, polyuria, visual disturbances, vomiting and weight loss. Last A1C in the office was:  Lab Results  Component Value Date   HGBA1C 6.3 (H) 08/06/2017   Last GFR Lab Results  Component Value Date   [facility] 79 08/06/2017  Patient is on Vitamin D supplement.   Lab Results  Component Value Date   VD25OH 62 08/06/2017       Medication Review: Current Outpatient Medications on File Prior to Visit  Medication Sig Dispense Refill  . ALPRAZolam (XANAX) 0.5 MG tablet Take 1/2-1 tablet 2-3  x /day ONLY if needed for Anxiety Attack & please limit to 5 days /week to avoid addiction 90 tablet 0  . amLODipine (NORVASC) 5 MG tablet TAKE 1 TABLET(5 MG) BY MOUTH DAILY 90 tablet 1  . aspirin 81 MG tablet Take 81 mg by mouth daily.    . Calcium Carbonate (CALCIUM 600 PO) Take by mouth daily.    . Cholecalciferol (VITAMIN D3) 5000 UNITS TABS t tab  2 x day    . fenofibrate 160 MG tablet TAKE 1 TABLET(160 MG) BY MOUTH DAILY 90 tablet 1  . Magnesium 500 MG CAPS Take 500 mg by mouth daily.    . Omega-3 Fatty Acids (FISH OIL) 1200 MG CAPS Take by mouth 3 (three) times daily.    Marland Kitchen OVER THE COUNTER MEDICATION Vitamin C. 500 mg. One tablet daily.    . penicillin v potassium (VEETID) 500 MG tablet TAKE 1 TABLET BY MOUTH FOUR TIMES DAILY 100 tablet 0  . amoxicillin-clavulanate (AUGMENTIN) 875-125 MG tablet TAKE 1 TABLET BY MOUTH TWICE DAILY UNTIL ALL TAKEN (Patient not taking: Reported on 12/10/2017) 20 tablet 0   Current Facility-Administered Medications on File Prior to Visit  Medication Dose Route Frequency Provider Last Rate Last Dose  . 0.9 %  sodium chloride infusion  500 mL Intravenous Once Nelida Meuse III, MD        Allergies: No Known Allergies  Current Problems (verified) has Hypertension; Psoriatic arthritis ([place]); Hyperlipidemia; Osteoporosis; Hx of colonic polyp; Medication management; Vitamin D deficiency; Prediabetes; BMI 29.0-29.9,adult; Encounter for Medicare annual wellness exam; and Multiple atypical nevi on their problem list.  Screening Tests Immunization History  Administered Date(s) Administered  . DT 03/17/2014  . PPD Test 07/19/2015, 01/22/2017  . Pneumococcal Conjugate-13 09/04/2016  . Pneumococcal-Unspecified 06/04/2003    Preventative care: Last colonoscopy: 2019 (after positive cologard) - repeat 10 years  Prior vaccinations: DT: 2015 Influenza: declines Pneumonia: 2005, DUE  Prevnar: 2018 Zoster: declines  Names of Other Physician/Practitioners you  currently use: 1. [facility] here for primary care 2. Has not gotten checked lately, eye doctor, last visit 2016  3. Does not see, dentist, last visit 2016  Patient Care Team: Unk Pinto, MD as PCP - General (Internal Medicine) Inda Castle, MD (Inactive) as Consulting Physician (Gastroenterology) Orville Govern, MD as Consulting Physician  Surgical: He  has a past surgical history that includes Colonoscopy and Polypectomy. Family His family history includes Diabetes in his father; Heart disease in his father; Hypertension in his father; Lymphoma in his mother; Prostate cancer in his father. Social history  He reports that he has never smoked. He has never used smokeless tobacco. He reports that he drinks alcohol. He reports that he does not use drugs.  MEDICARE WELLNESS OBJECTIVES: Physical activity: Current Exercise Habits: Home exercise routine, Type of exercise: walking, Time (Minutes): 30, Frequency (Times/Week): 7, Weekly Exercise (Minutes/Week): 210, Intensity: Mild, Exercise limited by: orthopedic condition(s) Cardiac risk factors: Cardiac Risk Factors include: advanced age (>66mn, >>44women);dyslipidemia;male gender;hypertension Depression/mood screen:   [facility] 12/10/2017  Decreased Interest 0  Down, Depressed, Hopeless 0  PHQ - 2 Score 0  ADLs:  In your present state of health, do you have any difficulty performing the following activities: 12/10/2017 12/10/2017  Hearing? N N  Vision? N N  Difficulty concentrating or making decisions? N N  Walking or climbing stairs? N -  Dressing or bathing? N N  Doing errands, shopping? N N  Some recent data might be hidden     Cognitive Testing  Alert? Yes  Normal Appearance?Yes  Oriented to person? Yes  Place? Yes   Time? Yes  Recall of three objects?  Yes  Can perform simple calculations? Yes  Displays appropriate judgment?Yes  Can read the correct time  from a watch face?Yes  EOL planning: Does Patient Have a Medical Advance Directive?: No Would patient like information on creating a medical advance directive?: No - Patient declined   Objective:   Today's Vitals   12/10/17 1508  BP: 122/68  Pulse: 77  Temp: (!) 97.3 F (36.3 C)  SpO2: 95%  Weight: 200 lb (90.7 kg)  Height: 5' 9.75" (1.772 m)   Body mass index is 28.9 kg/m.  Wt Readings from Last 3 Encounters:  12/10/17 200 lb (90.7 kg)  08/06/17 207 lb 3.2 oz (94 kg)  07/14/17 206 lb (93.4 kg)    General appearance: alert, no distress, WD/WN, male HEENT: normocephalic, sclerae anicteric, TMs pearly, nares patent, no discharge or erythema, pharynx normal Oral cavity: MMM, no lesions Neck: supple, no lymphadenopathy, no thyromegaly, no masses Heart: RRR, normal S1, S2, no murmurs Lungs: CTA bilaterally, no wheezes, rhonchi, or rales Abdomen: +bs, soft, non tender, non distended, no masses, no hepatomegaly, no splenomegaly Musculoskeletal: nontender, no swelling; obvious bony joint deformity/enlargement of DIP joints of bilateral hands and CMP joints without effusion or heat.  Extremities: no edema, no cyanosis, no clubbing Pulses: 2+ symmetric, upper and lower extremities, normal cap refill Neurological: alert, oriented x 3, CN2-12 intact, strength normal upper extremities and lower extremities, sensation normal throughout, DTRs 2+ throughout, no cerebellar signs, gait normal Psychiatric: normal affect, behavior normal, pleasant   Medicare Attestation I have personally reviewed: The patient's medical and social history Their use of alcohol, tobacco or illicit drugs Their current medications and supplements The patient's functional ability including ADLs,fall risks, home safety risks, cognitive, and hearing and visual impairment Diet and physical activities Evidence for depression or mood disorders  The patient's weight, height, BMI, and visual acuity have been recorded  in the chart.  I have made referrals, counseling, and provided education to the patient based on review of the above and I have provided the patient with a written personalized care plan for preventive services.     Izora Ribas, NP   12/10/2017

## 2017-12-10 ENCOUNTER — Encounter: Payer: Self-pay | Admitting: Adult Health

## 2017-12-10 ENCOUNTER — Ambulatory Visit: Payer: Medicare Other | Admitting: Adult Health

## 2017-12-10 VITALS — BP 122/68 | HR 77 | Temp 97.3°F | Ht 69.75 in | Wt 200.0 lb

## 2017-12-10 DIAGNOSIS — D229 Melanocytic nevi, unspecified: Secondary | ICD-10-CM | POA: Diagnosis not present

## 2017-12-10 DIAGNOSIS — Z23 Encounter for immunization: Secondary | ICD-10-CM

## 2017-12-10 DIAGNOSIS — Z6829 Body mass index (BMI) 29.0-29.9, adult: Secondary | ICD-10-CM | POA: Diagnosis not present

## 2017-12-10 DIAGNOSIS — E559 Vitamin D deficiency, unspecified: Secondary | ICD-10-CM

## 2017-12-10 DIAGNOSIS — R7303 Prediabetes: Secondary | ICD-10-CM | POA: Diagnosis not present

## 2017-12-10 DIAGNOSIS — E782 Mixed hyperlipidemia: Secondary | ICD-10-CM

## 2017-12-10 DIAGNOSIS — Z86010 Personal history of colonic polyps: Secondary | ICD-10-CM | POA: Diagnosis not present

## 2017-12-10 DIAGNOSIS — Z0001 Encounter for general adult medical examination with abnormal findings: Secondary | ICD-10-CM | POA: Diagnosis not present

## 2017-12-10 DIAGNOSIS — Z79899 Other long term (current) drug therapy: Secondary | ICD-10-CM | POA: Diagnosis not present

## 2017-12-10 DIAGNOSIS — M810 Age-related osteoporosis without current pathological fracture: Secondary | ICD-10-CM

## 2017-12-10 DIAGNOSIS — L4050 Arthropathic psoriasis, unspecified: Secondary | ICD-10-CM | POA: Diagnosis not present

## 2017-12-10 DIAGNOSIS — R6889 Other general symptoms and signs: Secondary | ICD-10-CM | POA: Diagnosis not present

## 2017-12-10 DIAGNOSIS — I10 Essential (primary) hypertension: Secondary | ICD-10-CM

## 2017-12-10 DIAGNOSIS — Z0000 Encounter for general adult medical examination without abnormal findings: Secondary | ICD-10-CM

## 2017-12-10 MED ORDER — ALPRAZOLAM 0.5 MG PO TABS: ORAL_TABLET | ORAL | 0 refills | Status: DC

## 2017-12-10 NOTE — Patient Instructions (Signed)
Aim for 7+ servings of fruits and vegetables daily  80+ fluid ounces of water or unsweet tea for healthy kidneys  Limit animal fats in diet for cholesterol and heart health - choose grass fed whenever available  Aim for low stress - take time to unwind and care for your mental health  Aim for 150 min of moderate intensity exercise weekly for heart health, and weights twice weekly for bone health  Aim for 7-9 hours of sleep daily      When it comes to diets, agreement about the perfect plan isn't easy to find, even among the experts. Experts at the [employer] developed an idea known as the Healthy Eating Plate. Just imagine a plate divided into logical, healthy portions.  The emphasis is on diet quality:  Load up on vegetables and fruits - one-half of your plate: Aim for color and variety, and remember that potatoes don't count.  Go for whole grains - one-quarter of your plate: Whole wheat, barley, wheat berries, quinoa, oats, brown rice, and foods made with them. If you want pasta, go with whole wheat pasta.  Protein power - one-quarter of your plate: Fish, chicken, beans, and nuts are all healthy, versatile protein sources. Limit red meat.  The diet, however, does go beyond the plate, offering a few other suggestions.  Use healthy plant oils, such as olive, canola, soy, corn, sunflower and peanut. Check the labels, and avoid partially hydrogenated oil, which have unhealthy trans fats.  If you're thirsty, drink water. Coffee and tea are good in moderation, but skip sugary drinks and limit milk and dairy products to one or two daily servings.  The type of carbohydrate in the diet is more important than the amount. Some sources of carbohydrates, such as vegetables, fruits, whole grains, and beans-are healthier than others.  Finally, stay active.

## 2017-12-11 LAB — LIPID PANEL
CHOLESTEROL: 183 mg/dL (ref <200)
HDL: 44 mg/dL (ref >40)
LDL Cholesterol (Calc): 110 mg/dL (calc) — ABNORMAL HIGH
Non-HDL Cholesterol (Calc): 139 mg/dL (calc) — ABNORMAL HIGH (ref <130)
Total CHOL/HDL Ratio: 4.2 (calc) (ref <5.0)
Triglycerides: 173 mg/dL — ABNORMAL HIGH (ref <150)

## 2017-12-11 LAB — CBC WITH DIFFERENTIAL/PLATELET
BASOS PCT: 0.6 %
Basophils Absolute: 62 cells/uL (ref 0–200)
EOS ABS: 135 {cells}/uL (ref 15–500)
Eosinophils Relative: 1.3 %
HCT: 46.9 % (ref 38.5–50.0)
Hemoglobin: 16.9 g/dL (ref 13.2–17.1)
Lymphs Abs: 2205 cells/uL (ref 850–3900)
MCH: 31.1 pg (ref 27.0–33.0)
MCHC: 36 g/dL (ref 32.0–36.0)
MCV: 86.4 fL (ref 80.0–100.0)
MONOS PCT: 7.2 %
MPV: 10.3 fL (ref 7.5–12.5)
Neutro Abs: 7249 cells/uL (ref 1500–7800)
Neutrophils Relative %: 69.7 %
PLATELETS: 426 10*3/uL — AB (ref 140–400)
RBC: 5.43 10*6/uL (ref 4.20–5.80)
RDW: 13.5 % (ref 11.0–15.0)
TOTAL LYMPHOCYTE: 21.2 %
WBC mixed population: 749 cells/uL (ref 200–950)
WBC: 10.4 10*3/uL (ref 3.8–10.8)

## 2017-12-11 LAB — COMPLETE METABOLIC PANEL WITH GFR
AG RATIO: 1.4 (calc) (ref 1.0–2.5)
ALKALINE PHOSPHATASE (APISO): 49 U/L (ref 40–115)
ALT: 40 U/L (ref 9–46)
AST: 35 U/L (ref 10–35)
Albumin: 4.4 g/dL (ref 3.6–5.1)
BILIRUBIN TOTAL: 0.7 mg/dL (ref 0.2–1.2)
BUN: 13 mg/dL (ref 7–25)
CALCIUM: 9.5 mg/dL (ref 8.6–10.3)
CHLORIDE: 104 mmol/L (ref 98–110)
CO2: 25 mmol/L (ref 20–32)
Creat: 0.89 mg/dL (ref 0.70–1.18)
GFR, EST NON AFRICAN AMERICAN: 87 mL/min/{1.73_m2} (ref >=60)
GFR, Est African American: 100 mL/min/{1.73_m2} (ref >=60)
GLOBULIN: 3.1 g/dL (ref 1.9–3.7)
Glucose, Bld: 99 mg/dL (ref 65–99)
POTASSIUM: 4.2 mmol/L (ref 3.5–5.3)
Sodium: 139 mmol/L (ref 135–146)
Total Protein: 7.5 g/dL (ref 6.1–8.1)

## 2017-12-11 LAB — HEMOGLOBIN A1C
EAG (MMOL/L): 6.6 (calc)
Hgb A1c MFr Bld: 5.8 % of total Hgb — ABNORMAL HIGH (ref <5.7)
Mean Plasma Glucose: 120 (calc)

## 2017-12-11 LAB — TSH: TSH: 1.11 mIU/L (ref 0.40–4.50)

## 2017-12-15 DIAGNOSIS — L409 Psoriasis, unspecified: Secondary | ICD-10-CM | POA: Diagnosis not present

## 2017-12-15 DIAGNOSIS — Z79899 Other long term (current) drug therapy: Secondary | ICD-10-CM | POA: Diagnosis not present

## 2017-12-15 DIAGNOSIS — L4050 Arthropathic psoriasis, unspecified: Secondary | ICD-10-CM | POA: Diagnosis not present

## 2017-12-22 ENCOUNTER — Other Ambulatory Visit: Payer: Self-pay | Admitting: Internal Medicine

## 2018-01-14 ENCOUNTER — Other Ambulatory Visit: Payer: Self-pay | Admitting: Internal Medicine

## 2018-01-14 DIAGNOSIS — E782 Mixed hyperlipidemia: Secondary | ICD-10-CM

## 2018-02-13 ENCOUNTER — Other Ambulatory Visit: Payer: Self-pay | Admitting: Internal Medicine

## 2018-03-02 ENCOUNTER — Ambulatory Visit: Payer: Self-pay | Admitting: Internal Medicine

## 2018-03-12 ENCOUNTER — Ambulatory Visit: Payer: Medicare Other | Admitting: Internal Medicine

## 2018-03-12 ENCOUNTER — Encounter: Payer: Self-pay | Admitting: Internal Medicine

## 2018-03-12 VITALS — BP 126/78 | HR 76 | Temp 97.7°F | Resp 18 | Ht 69.0 in | Wt 205.0 lb

## 2018-03-12 DIAGNOSIS — R7303 Prediabetes: Secondary | ICD-10-CM

## 2018-03-12 DIAGNOSIS — I10 Essential (primary) hypertension: Secondary | ICD-10-CM

## 2018-03-12 DIAGNOSIS — E782 Mixed hyperlipidemia: Secondary | ICD-10-CM | POA: Diagnosis not present

## 2018-03-12 DIAGNOSIS — E559 Vitamin D deficiency, unspecified: Secondary | ICD-10-CM | POA: Diagnosis not present

## 2018-03-12 DIAGNOSIS — Z79899 Other long term (current) drug therapy: Secondary | ICD-10-CM

## 2018-03-12 DIAGNOSIS — R7309 Other abnormal glucose: Secondary | ICD-10-CM

## 2018-03-12 NOTE — Progress Notes (Signed)
This very nice 72 y.o. MWM presents for 6 month follow up with HTN, HLD, Pre-Diabetes and Vitamin D Deficiency.      Patient is treated for HTN (2013) & BP has been controlled at home. Today's BP is at goal - 126/78. Patient has had no complaints of any cardiac type chest pain, palpitations, dyspnea / orthopnea / PND, dizziness, claudication, or dependent edema.     Hyperlipidemia is controlled with diet & meds. Patient denies myalgias or other med SE's. Last Lipids were not at goal: Lab Results  Component Value Date   CHOL 183 12/10/2017   HDL 44 12/10/2017   LDLCALC 110 (H) 12/10/2017   TRIG 173 (H) 12/10/2017   CHOLHDL 4.2 12/10/2017      Also, the patient has history of PreDiabetes (A1c-6.5%/2012) and has had no symptoms of reactive hypoglycemia, diabetic polys, paresthesias or visual blurring.  Last A1c was not at goal: Lab Results  Component Value Date   HGBA1C 5.8 (H) 12/10/2017      Further, the patient also has history of Vitamin D Deficiency and supplements vitamin D without any suspected side-effects. Last vitamin D was at goal:    Lab Results  Component Value Date   VD25OH 62 08/06/2017   Current Outpatient Medications on File Prior to Visit  Medication Sig  . ALPRAZolam (XANAX) 0.5 MG tablet TAKE 1/2 TO 1 TABLET BY MOUTH 2 TO 3 TIMES A DAY ONLY IF NEEDED FOR ANXIETY ATTACK AND PLEASE LIMIT TO 5 DAYS A WEEK TO AVOID ADDICTION  . amLODipine (NORVASC) 5 MG tablet TAKE 1 TABLET(5 MG) BY MOUTH DAILY  . amoxicillin-clavulanate (AUGMENTIN) 875-125 MG tablet TAKE 1 TABLET BY MOUTH TWICE DAILY UNTIL ALL TAKEN  . aspirin 81 MG tablet Take 81 mg by mouth daily.  . Calcium Carbonate (CALCIUM 600 PO) Take by mouth daily.  . Cholecalciferol (VITAMIN D3) 5000 UNITS TABS t tab  2 x day (Patient taking differently: 1 tablet daily.)  . fenofibrate 160 MG tablet TAKE 1 TABLET(160 MG) BY MOUTH DAILY  . Guselkumab (TREMFYA [place]) Inject into the skin. Takes injection every 3 months.  .  Magnesium 500 MG CAPS Take 500 mg by mouth daily.  . Omega-3 Fatty Acids (FISH OIL) 1200 MG CAPS Take by mouth daily.   Marland Kitchen OVER THE COUNTER MEDICATION Vitamin C. 500 mg. One tablet daily.  . penicillin v potassium (VEETID) 500 MG tablet TAKE 1 TABLET BY MOUTH FOUR TIMES DAILY   No current facility-administered medications on file prior to visit.    No Known Allergies   PMHx:   Past Medical History:  Diagnosis Date  . Arthritis   . Elevated LFTs   . Hx of colonic polyp   . Hyperlipidemia   . Hypertension   . Hypogonadism male   . Osteoporosis   . Psoriatic arthritis ([place])   . 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 DT 03/17/2014  . PPD Test 07/19/2015, 01/22/2017  . Pneumococcal Conjugate-13 09/04/2016  . Pneumococcal Polysaccharide-23 12/10/2017  . Pneumococcal-Unspecified 06/04/2003   Past Surgical History:  Procedure Laterality Date  . COLONOSCOPY    . POLYPECTOMY     FHx:    Reviewed / unchanged  SHx:    Reviewed / unchanged   Systems Review:  Constitutional: Denies fever, chills, wt changes, headaches, insomnia, fatigue, night sweats, change in appetite. Eyes: Denies redness, blurred vision, diplopia, discharge, itchy, watery eyes.  ENT: Denies discharge,  congestion, post nasal drip, epistaxis, sore throat, earache, hearing loss, dental pain, tinnitus, vertigo, sinus pain, snoring.  CV: Denies chest pain, palpitations, irregular heartbeat, syncope, dyspnea, diaphoresis, orthopnea, PND, claudication or edema. Respiratory: denies cough, dyspnea, DOE, pleurisy, hoarseness, laryngitis, wheezing.  Gastrointestinal: Denies dysphagia, odynophagia, heartburn, reflux, water brash, abdominal pain or cramps, nausea, vomiting, bloating, diarrhea, constipation, hematemesis, melena, hematochezia  or hemorrhoids. Genitourinary: Denies dysuria, frequency, urgency, nocturia, hesitancy, discharge, hematuria or flank pain. Musculoskeletal:  Denies arthralgias, myalgias, stiffness, jt. swelling, pain, limping or strain/sprain.  Skin: Denies pruritus, rash, hives, warts, acne, eczema or change in skin lesion(s). Neuro: No weakness, tremor, incoordination, spasms, paresthesia or pain. Psychiatric: Denies confusion, memory loss or sensory loss. Endo: Denies change in weight, skin or hair change.  Heme/Lymph: No excessive bleeding, bruising or enlarged lymph nodes.  Physical Exam  BP 126/78   Pulse 76   Temp 97.7 F (36.5 C)   Resp 18   Ht 5' 9"  (1.753 m)   Wt 205 lb (93 kg)   BMI 30.27 kg/m   Appears  well nourished, well groomed  and in no distress.  Eyes: PERRLA, EOMs, conjunctiva no swelling or erythema. Sinuses: No frontal/maxillary tenderness ENT/Mouth: EAC's clear, TM's nl w/o erythema, bulging. Nares clear w/o erythema, swelling, exudates. Oropharynx clear without erythema or exudates. Oral hygiene is good. Tongue normal, non obstructing. Hearing intact.  Neck: Supple. Thyroid not palpable. Car 2+/2+ without bruits, nodes or JVD. Chest: Respirations nl with BS clear & equal w/o rales, rhonchi, wheezing or stridor.  Cor: Heart sounds normal w/ regular rate and rhythm without sig. murmurs, gallops, clicks or rubs. Peripheral pulses normal and equal  without edema.  Abdomen: Soft & bowel sounds normal. Non-tender w/o guarding, rebound, hernias, masses or organomegaly.  Lymphatics: Unremarkable.  Musculoskeletal: Full ROM all peripheral extremities, joint stability, 5/5 strength and normal gait.  Skin: Warm, dry without exposed rashes, lesions or ecchymosis apparent.  Neuro: Cranial nerves intact, reflexes equal bilaterally. Sensory-motor testing grossly intact. Tendon reflexes grossly intact.  Pysch: Alert & oriented x 3.  Insight and judgement nl & appropriate. No ideations.  Assessment and Plan:  1. Essential hypertension  - Continue medication, monitor blood pressure at home.  - Continue DASH diet.  Reminder to  go to the ER if any CP,  SOB, nausea, dizziness, severe HA, changes vision/speech.  - CBC with Differential/Platelet - COMPLETE METABOLIC PANEL WITH GFR - Magnesium - TSH  2. Hyperlipidemia, mixed  - Continue diet/meds, exercise,& lifestyle modifications.  - Continue monitor periodic cholesterol/liver & renal functions   - Lipid panel - TSH  3. Abnormal glucose  - Continue diet, exercise,  - lifestyle modifications.  - Monitor appropriate labs.  - Hemoglobin A1c - Insulin, random  4. Vitamin D deficiency  - Continue supplementation.   - VITAMIN D 25 Hydroxyl  5. Prediabetes  - Hemoglobin A1c - Insulin, random  6. Medication management  - CBC with Differential/Platelet - COMPLETE METABOLIC PANEL WITH GFR - Magnesium - Lipid panel - TSH - Hemoglobin A1c - Insulin, random - VITAMIN D 25 Hydroxyl      Discussed  regular exercise, BP monitoring, weight control to achieve/maintain BMI less than 25 and discussed med and SE's. Recommended labs to assess and monitor clinical status with further disposition pending results of labs. Over 30 minutes of exam, counseling, chart review was performed.

## 2018-03-12 NOTE — Patient Instructions (Signed)

## 2018-03-13 LAB — CBC WITH DIFFERENTIAL/PLATELET
Basophils Absolute: 55 cells/uL (ref 0–200)
Basophils Relative: 0.6 %
Eosinophils Absolute: 212 cells/uL (ref 15–500)
Eosinophils Relative: 2.3 %
HCT: 43.7 % (ref 38.5–50.0)
HEMOGLOBIN: 15.6 g/dL (ref 13.2–17.1)
Lymphs Abs: 2539 cells/uL (ref 850–3900)
MCH: 31.1 pg (ref 27.0–33.0)
MCHC: 35.7 g/dL (ref 32.0–36.0)
MCV: 87.2 fL (ref 80.0–100.0)
MONOS PCT: 7.7 %
MPV: 10.5 fL (ref 7.5–12.5)
NEUTROS ABS: 5686 {cells}/uL (ref 1500–7800)
Neutrophils Relative %: 61.8 %
Platelets: 394 10*3/uL (ref 140–400)
RBC: 5.01 10*6/uL (ref 4.20–5.80)
RDW: 13.1 % (ref 11.0–15.0)
Total Lymphocyte: 27.6 %
WBC mixed population: 708 cells/uL (ref 200–950)
WBC: 9.2 10*3/uL (ref 3.8–10.8)

## 2018-03-13 LAB — LIPID PANEL
CHOLESTEROL: 160 mg/dL (ref <200)
HDL: 37 mg/dL — ABNORMAL LOW (ref >40)
LDL CHOLESTEROL (CALC): 92 mg/dL
Non-HDL Cholesterol (Calc): 123 mg/dL (calc) (ref <130)
Total CHOL/HDL Ratio: 4.3 (calc) (ref <5.0)
Triglycerides: 223 mg/dL — ABNORMAL HIGH (ref <150)

## 2018-03-13 LAB — COMPLETE METABOLIC PANEL WITH GFR
AG RATIO: 1.3 (calc) (ref 1.0–2.5)
ALBUMIN MSPROF: 4 g/dL (ref 3.6–5.1)
ALT: 27 U/L (ref 9–46)
AST: 21 U/L (ref 10–35)
Alkaline phosphatase (APISO): 46 U/L (ref 40–115)
BUN: 13 mg/dL (ref 7–25)
CALCIUM: 9.4 mg/dL (ref 8.6–10.3)
CO2: 30 mmol/L (ref 20–32)
CREATININE: 0.96 mg/dL (ref 0.70–1.18)
Chloride: 102 mmol/L (ref 98–110)
GFR, EST AFRICAN AMERICAN: 92 mL/min/{1.73_m2} (ref >=60)
GFR, EST NON AFRICAN AMERICAN: 80 mL/min/{1.73_m2} (ref >=60)
GLOBULIN: 3 g/dL (ref 1.9–3.7)
Glucose, Bld: 81 mg/dL (ref 65–99)
Potassium: 4.5 mmol/L (ref 3.5–5.3)
SODIUM: 140 mmol/L (ref 135–146)
Total Bilirubin: 0.5 mg/dL (ref 0.2–1.2)
Total Protein: 7 g/dL (ref 6.1–8.1)

## 2018-03-13 LAB — MAGNESIUM: Magnesium: 2.1 mg/dL (ref 1.5–2.5)

## 2018-03-13 LAB — HEMOGLOBIN A1C
HEMOGLOBIN A1C: 6 %{Hb} — AB (ref <5.7)
MEAN PLASMA GLUCOSE: 126 (calc)
eAG (mmol/L): 7 (calc)

## 2018-03-13 LAB — INSULIN, RANDOM: Insulin: 5.4 u[IU]/mL (ref 2.0–19.6)

## 2018-03-13 LAB — VITAMIN D 25 HYDROXY (VIT D DEFICIENCY, FRACTURES): VIT D 25 HYDROXY: 84 ng/mL (ref 30–100)

## 2018-03-13 LAB — TSH: TSH: 1.1 m[IU]/L (ref 0.40–4.50)

## 2018-03-15 ENCOUNTER — Encounter: Payer: Self-pay | Admitting: Internal Medicine

## 2018-03-16 ENCOUNTER — Other Ambulatory Visit: Payer: Self-pay | Admitting: Internal Medicine

## 2018-03-17 ENCOUNTER — Other Ambulatory Visit: Payer: Self-pay | Admitting: Internal Medicine

## 2018-03-17 DIAGNOSIS — R450 Nervousness: Secondary | ICD-10-CM

## 2018-03-17 MED ORDER — BUSPIRONE HCL 15 MG PO TABS: ORAL_TABLET | ORAL | 0 refills | Status: DC

## 2018-03-19 ENCOUNTER — Encounter: Payer: Self-pay | Admitting: Gastroenterology

## 2018-03-24 ENCOUNTER — Encounter: Payer: Self-pay | Admitting: Gastroenterology

## 2018-04-28 ENCOUNTER — Ambulatory Visit (AMBULATORY_SURGERY_CENTER): Payer: Self-pay | Admitting: *Deleted

## 2018-04-28 VITALS — Ht 70.0 in | Wt 200.0 lb

## 2018-04-28 DIAGNOSIS — K5010 Crohn's disease of large intestine without complications: Secondary | ICD-10-CM

## 2018-04-28 MED ORDER — NA SULFATE-K SULFATE-MG SULF 17.5-3.13-1.6 GM/177ML PO SOLN: 1.0000 | Freq: Once | ORAL | 0 refills | Status: AC

## 2018-04-28 NOTE — Progress Notes (Signed)
No egg or soy allergy known to patient  No issues with past sedation with any surgeries  or procedures, no past intubation  No diet pills per patient No home 02 use per patient  No blood thinners per patient  Pt denies issues with constipation  No A fib or A flutter  EMMI video sent to pt's e mail -- pt declined

## 2018-05-01 ENCOUNTER — Other Ambulatory Visit: Payer: Self-pay | Admitting: Internal Medicine

## 2018-05-06 ENCOUNTER — Encounter: Payer: Self-pay | Admitting: Gastroenterology

## 2018-05-20 ENCOUNTER — Encounter: Payer: Self-pay | Admitting: Gastroenterology

## 2018-05-20 ENCOUNTER — Ambulatory Visit (AMBULATORY_SURGERY_CENTER): Payer: Medicare Other | Admitting: Gastroenterology

## 2018-05-20 VITALS — BP 154/90 | HR 77 | Temp 96.6°F | Resp 20 | Ht 70.0 in | Wt 200.0 lb

## 2018-05-20 DIAGNOSIS — K5000 Crohn's disease of small intestine without complications: Secondary | ICD-10-CM

## 2018-05-20 DIAGNOSIS — K5010 Crohn's disease of large intestine without complications: Secondary | ICD-10-CM

## 2018-05-20 DIAGNOSIS — K5090 Crohn's disease, unspecified, without complications: Secondary | ICD-10-CM | POA: Diagnosis not present

## 2018-05-20 MED ORDER — SODIUM CHLORIDE 0.9 % IV SOLN: 500.0000 mL | Freq: Once | INTRAVENOUS | Status: DC

## 2018-05-20 NOTE — Progress Notes (Signed)
Pt's states no medical or surgical changes since previsit or office visit. 

## 2018-05-20 NOTE — Op Note (Addendum)
[place] Patient Name: Melvin Shelton Procedure Date: 05/20/2018 8:04 AM MRN: [id number] Endoscopist: Mallie Mussel L. Loletha Carrow , MD Age: 72 Referring MD:  Date of Birth: [birth year] Gender: Male Account #: [id number] Procedure:                Colonoscopy Indications:              Follow-up of Crohn's disease of the small bowel                            (colonoscopy performed Feb 2019 for positive                            cologuard. terminal ileitis discovered, believed                            related to secukinumab, which was then                            discontinued) The patient still does not have                            chronic abdominal pain or diarrhea. Medicines:                Monitored Anesthesia Care Procedure:                Pre-Anesthesia Assessment:                           - Prior to the procedure, a History and Physical                            was performed, and patient medications and                            allergies were reviewed. The patient's tolerance of                            previous anesthesia was also reviewed. The risks                            and benefits of the procedure and the sedation                            options and risks were discussed with the patient.                            All questions were answered, and informed consent                            was obtained. Anticoagulants: The patient has taken                            aspirin. It was decided not to withhold this  medication prior to the procedure. ASA Grade                            Assessment: II - A patient with mild systemic                            disease. After reviewing the risks and benefits,                            the patient was deemed in satisfactory condition to                            undergo the procedure.                           After obtaining informed consent, the colonoscope      was passed under direct vision. Throughout the                            procedure, the patient's blood pressure, pulse, and                            oxygen saturations were monitored continuously. The                            Colonoscope was introduced through the anus and                            advanced to the the terminal ileum, with                            identification of the appendiceal orifice and IC                            valve. The colonoscopy was performed without                            difficulty. The patient tolerated the procedure                            well. The quality of the bowel preparation was good. Scope In: 8:15:35 AM Scope Out: 8:27:35 AM Scope Withdrawal Time: 0 hours 10 minutes 8 seconds  Total Procedure Duration: 0 hours 12 minutes 0 seconds  Findings:                 The perianal and digital rectal examinations were                            normal. Specifically, no peri-anal fistula was seen.                           Inflammation, moderate in severity and  characterized by congestion (edema) and shallow                            ulcerations was found in the terminal ileum.                            Biopsies were taken with a cold forceps for                            histology. This has improved by about 50% from what                            was previously seen.                           A few small-mouthed diverticula were found in the                            sigmoid colon.                           The exam was otherwise without abnormality on                            direct and retroflexion views. Complications:            No immediate complications. Estimated Blood Loss:     Estimated blood loss was minimal. Impression:               - Crohn's disease with ileitis. Biopsied.                           - Diverticulosis in the sigmoid colon.                           - The examination was  otherwise normal on direct                            and retroflexion views. Recommendation:           - Patient has a contact number available for                            emergencies. The signs and symptoms of potential                            delayed complications were discussed with the                            patient. Return to normal activities tomorrow.                            Written discharge instructions were provided to the                            patient.                           -  Resume previous diet.                           - Continue present medications.                           - Repeat colonoscopy in 1 year to assess disease                            activity. Whittley Carandang L. Loletha Carrow, MD 05/20/2018 8:36:39 AM This report has been signed electronically.

## 2018-05-20 NOTE — Progress Notes (Signed)
VSS. Pt to PACU. Report to RN

## 2018-05-20 NOTE — Progress Notes (Signed)
Called to room to assist during endoscopic procedure.  Patient ID and intended procedure confirmed with present staff. Received instructions for my participation in the procedure from the performing physician.  

## 2018-05-20 NOTE — Patient Instructions (Signed)
   Information given on diverticulosis given to you today.    Await pathology results.   Repeat colonoscopy in one year.  YOU HAD AN ENDOSCOPIC PROCEDURE TODAY AT [facility]:   Refer to the procedure report that was given to you for any specific questions about what was found during the examination.  If the procedure report does not answer your questions, please call your gastroenterologist to clarify.  If you requested that your care partner not be given the details of your procedure findings, then the procedure report has been included in a sealed envelope for you to review at your convenience later.  YOU SHOULD EXPECT: Some feelings of bloating in the abdomen. Passage of more gas than usual.  Walking can help get rid of the air that was put into your GI tract during the procedure and reduce the bloating. If you had a lower endoscopy (such as a colonoscopy or flexible sigmoidoscopy) you may notice spotting of blood in your stool or on the toilet paper. If you underwent a bowel prep for your procedure, you may not have a normal bowel movement for a few days.  Please Note:  You might notice some irritation and congestion in your nose or some drainage.  This is from the oxygen used during your procedure.  There is no need for concern and it should clear up in a day or so.  SYMPTOMS TO REPORT IMMEDIATELY:   Following lower endoscopy (colonoscopy or flexible sigmoidoscopy):  Excessive amounts of blood in the stool  Significant tenderness or worsening of abdominal pains  Swelling of the abdomen that is new, acute  Fever of 100F or higher  For urgent or emergent issues, a gastroenterologist can be reached at any hour by calling [phone].   DIET:  We do recommend a small meal at first, but then you may proceed to your regular diet.  Drink plenty of fluids but you should avoid alcoholic beverages for 24 hours.  ACTIVITY:  You should plan to take it easy for the rest of  today and you should NOT DRIVE or use heavy machinery until tomorrow (because of the sedation medicines used during the test).    FOLLOW UP: Our staff will call the number listed on your records the next business day following your procedure to check on you and address any questions or concerns that you may have regarding the information given to you following your procedure. If we do not reach you, we will leave a message.  However, if you are feeling well and you are not experiencing any problems, there is no need to return our call.  We will assume that you have returned to your regular daily activities without incident.  If any biopsies were taken you will be contacted by phone or by letter within the next 1-3 weeks.  Please call us at [phone] if you have not heard about the biopsies in 3 weeks.    SIGNATURES/CONFIDENTIALITY: You and/or your care partner have signed paperwork which will be entered into your electronic medical record.  These signatures attest to the fact that that the information above on your After Visit Summary has been reviewed and is understood.  Full responsibility of the confidentiality of this discharge information lies with you and/or your care-partner.

## 2018-05-21 ENCOUNTER — Telehealth: Payer: Self-pay | Admitting: *Deleted

## 2018-05-21 ENCOUNTER — Telehealth: Payer: Self-pay

## 2018-05-21 NOTE — Telephone Encounter (Signed)
Follow up phone call no answer. Message left.

## 2018-05-21 NOTE — Telephone Encounter (Signed)
  Follow up Call-  Call back number 05/20/2018 07/14/2017  Post procedure Call Back phone  # [phone]  Permission to leave phone message Yes Yes  Some recent data might be hidden     Patient questions:  Do you have a fever, pain , or abdominal swelling? No. Pain Score  0 *  Have you tolerated food without any problems? Yes.    Have you been able to return to your normal activities? Yes.    Do you have any questions about your discharge instructions: Diet   No. Medications  No. Follow up visit  No.  Do you have questions or concerns about your Care? No.  Actions: * If pain score is 4 or above: No action needed, pain <4.

## 2018-06-06 ENCOUNTER — Other Ambulatory Visit: Payer: Self-pay | Admitting: Internal Medicine

## 2018-06-16 ENCOUNTER — Other Ambulatory Visit: Payer: Self-pay | Admitting: Internal Medicine

## 2018-06-17 NOTE — Progress Notes (Signed)
MEDICARE ANNUAL WELLNESS VISIT AND FOLLOW UP Assessment:    Encounter for Medicare annual wellness exam 1 year  Essential hypertension Continue medication Monitor blood pressure at home; call if consistently over 130/80 Continue DASH diet.   Reminder to go to the ER if any CP, SOB, nausea, dizziness, severe HA, changes vision/speech, left arm numbness and tingling and jaw pain.  Prediabetes Discussed disease and risks Discussed diet/exercise, weight management  A1C, insulin level  Psoriatic arthritis ([place]) Followed by Dr. Lyman Speller  Osteoporosis without current pathological fracture, unspecified osteoporosis type Continue Vit D and Ca, weight bearing exercises  Multiple atypical nevi Followed by dermatology  Vitamin D deficiency At goal at recent check; continue to recommend supplementation for goal of 70-100 Defer vitamin D level  Mixed hyperlipidemia Continue medications Continue low cholesterol diet and exercise.  Check lipid panel.   Hx of colonic polyp UTD on colonoscopy  BMI 29.0-29.9,adult Long discussion about weight loss, diet, and exercise Recommended diet heavy in fruits and veggies and low in animal meats, cheeses, and dairy products, appropriate calorie intake Discussed appropriate weight for height  Follow up at next visit   Over 30 minutes of exam, counseling, chart review, and critical decision making was performed  Future Appointments  Date Time Provider [place]  09/23/2018  3:45 PM Unk Pinto, MD [facility] None     Plan:   During the course of the visit the patient was educated and counseled about appropriate screening and preventive services including:    Pneumococcal vaccine   Influenza vaccine  Prevnar 13  Td vaccine  Screening electrocardiogram  Colorectal cancer screening  Diabetes screening  Glaucoma screening  Nutrition counseling    Subjective:  Melvin Shelton is a 73 y.o. male who presents for  Medicare Annual Wellness Visit and 3 month follow up for HTN, hyperlipidemia, prediabetes, and vitamin D Def.   [name] Patient has long standing [place] Psoriasis & Psoriatic Arthritis in remission on Cosentyx, but 07/2017 had Colonoscopy by Dr Loletha Carrow with  Bx (+) Crohn's in the TI, so Cosentyx has been discontinued and is being switched to Tremgya--> now on syrea - Dr Lyman Speller.   BMI is Body mass index is 29.39 kg/m., he has been working on diet and exercise. Wt Readings from Last 3 Encounters:  06/18/18 204 lb 12.8 oz (92.9 kg)  05/20/18 200 lb (90.7 kg)  04/28/18 200 lb (90.7 kg)   His blood pressure has been controlled at home, today their BP is BP: 128/76  He does not workout, is on tour so it makes it difficult.  He denies chest pain, shortness of breath, dizziness.   He is on cholesterol medication and denies myalgias. His cholesterol is not at goal. The cholesterol last visit was:   Lab Results  Component Value Date   CHOL 160 03/12/2018   HDL 37 (L) 03/12/2018   LDLCALC 92 03/12/2018   TRIG 223 (H) 03/12/2018   CHOLHDL 4.3 03/12/2018    He has been working on diet and exercise for prediabetes, and denies foot ulcerations, increased appetite, nausea, paresthesia of the feet, polydipsia, polyuria, visual disturbances, vomiting and weight loss. Last A1C in the office was:  Lab Results  Component Value Date   HGBA1C 6.0 (H) 03/12/2018   Last GFR Lab Results  Component Value Date   GFRNONAA 80 03/12/2018    Patient is on Vitamin D supplement.   Lab Results  Component Value Date   VD25OH 84 03/12/2018  Medication Review: Current Outpatient Medications on File Prior to Visit  Medication Sig Dispense Refill  . ALPRAZolam (XANAX) 0.5 MG tablet Take 1/2-1 tablet 2 - 3 x /day ONLY if needed for Anxiety Attack &  limit to 5 days /week to avoid addiction 80 tablet 0  . amLODipine (NORVASC) 5 MG tablet TAKE 1 TABLET(5 MG) BY MOUTH DAILY 90 tablet 0  .  amoxicillin-clavulanate (AUGMENTIN) 875-125 MG tablet TAKE 1 TABLET BY MOUTH TWICE DAILY UNTIL ALL TAKEN 20 tablet 0  . aspirin 81 MG tablet Take 81 mg by mouth daily.    . Calcium Carbonate (CALCIUM 600 PO) Take by mouth daily.    . Cholecalciferol (VITAMIN D3) 5000 UNITS TABS t tab  2 x day (Patient taking differently: 1 tablet daily.)    . Clobetasol Prop Emollient Base 0.05 % emollient cream     . dexamethasone (DECADRON) 0.75 MG tablet TAKE 1/2 TO 1 TABLET BY MOUTH EVERY DAY    . fenofibrate 160 MG tablet TAKE 1 TABLET(160 MG) BY MOUTH DAILY 90 tablet 0  . Guselkumab (TREMFYA [place]) Inject into the skin. Takes injection every 3 months.    . Magnesium 500 MG CAPS Take 500 mg by mouth daily.    . Omega-3 Fatty Acids (FISH OIL) 1200 MG CAPS Take by mouth daily.     Marland Kitchen OVER THE COUNTER MEDICATION Vitamin C. 500 mg. One tablet daily.    . penicillin v potassium (VEETID) 500 MG tablet TAKE 1 TABLET BY MOUTH FOUR TIMES DAILY 100 tablet 0   No current facility-administered medications on file prior to visit.     Allergies: No Known Allergies  Current Problems (verified) has Hypertension; Psoriatic arthritis ([place]); Hyperlipidemia; Osteoporosis; Hx of colonic polyp; Medication management; Vitamin D deficiency; Prediabetes; Encounter for Medicare annual wellness exam; Multiple atypical nevi; and Crohn disease ([place]) on their problem list.  Screening Tests Immunization History  Administered Date(s) Administered  . DT 03/17/2014  . PPD Test 07/19/2015, 01/22/2017  . Pneumococcal Conjugate-13 09/04/2016  . Pneumococcal Polysaccharide-23 12/10/2017  . Pneumococcal-Unspecified 06/04/2003    Preventative care: Last colonoscopy: 2019 (after positive cologard) - repeat 1 year- Dr. Loletha Carrow  Prior vaccinations: DT: 2015 Influenza: declines Pneumonia: 2018 Prevnar: 2018 Zoster: declines  Names of Other Physician/Practitioners you currently use: 1. [facility]  here for primary care 2. Has not gotten checked lately, eye doctor, last visit 2016  3. Does not see, dentist, last visit 2016  Patient Care Team: Unk Pinto, MD as PCP - General (Internal Medicine) Inda Castle, MD (Inactive) as Consulting Physician (Gastroenterology) Orville Govern, MD as Consulting Physician  Surgical: He  has a past surgical history that includes Colonoscopy; Polypectomy; and Tonsillectomy. Family His family history includes Diabetes in his father; Heart disease in his father; Hypertension in his father; Lymphoma in his mother; Prostate cancer in his father. Social history  He reports that he has never smoked. He has never used smokeless tobacco. He reports current alcohol use. He reports that he does not use drugs.  MEDICARE WELLNESS OBJECTIVES: Physical activity: Current Exercise Habits: The patient does not participate in regular exercise at present(on tour, wants to get back to it) Cardiac risk factors: Cardiac Risk Factors include: advanced age (>59mn, >>7women);dyslipidemia;male gender;hypertension Depression/mood screen:   [facility]  Decreased Interest 0  Down, Depressed, Hopeless 0  PHQ - 2 Score 0    ADLs:  In your present state of health, do  you have any difficulty performing the following activities: 06/18/2018 03/15/2018  Hearing? N N  Vision? N N  Difficulty concentrating or making decisions? N N  Walking or climbing stairs? N N  Dressing or bathing? N N  Doing errands, shopping? N N  Some recent data might be hidden     Cognitive Testing  Alert? Yes  Normal Appearance?Yes  Oriented to person? Yes  Place? Yes   Time? Yes  Recall of three objects?  Yes  Can perform simple calculations? Yes  Displays appropriate judgment?Yes  Can read the correct time from a watch face?Yes  EOL planning: Does Patient Have a Medical Advance Directive?: No Would patient like information on creating a medical advance  directive?: No - Patient declined No- declines papers  Objective:   Today's Vitals   06/18/18 1536  BP: 128/76  Pulse: 67  Temp: 98.9 F (37.2 C)  SpO2: 97%  Weight: 204 lb 12.8 oz (92.9 kg)  Height: 5' 10"  (1.778 m)   Body mass index is 29.39 kg/m.  Wt Readings from Last 3 Encounters:  06/18/18 204 lb 12.8 oz (92.9 kg)  05/20/18 200 lb (90.7 kg)  04/28/18 200 lb (90.7 kg)    General appearance: alert, no distress, WD/WN, male HEENT: normocephalic, sclerae anicteric, TMs pearly, nares patent, no discharge or erythema, pharynx normal Oral cavity: MMM, no lesions Neck: supple, no lymphadenopathy, no thyromegaly, no masses Heart: RRR, normal S1, S2, no murmurs Lungs: CTA bilaterally, no wheezes, rhonchi, or rales Abdomen: +bs, soft, non tender, non distended, no masses, no hepatomegaly, no splenomegaly Musculoskeletal: nontender, no swelling; obvious bony joint deformity/enlargement of DIP joints of bilateral hands and CMP joints without effusion or heat.  Extremities: no edema, no cyanosis, no clubbing Pulses: 2+ symmetric, upper and lower extremities, normal cap refill Neurological: alert, oriented x 3, CN2-12 intact, strength normal upper extremities and lower extremities, sensation normal throughout, DTRs 2+ throughout, no cerebellar signs, gait normal Psychiatric: normal affect, behavior normal, pleasant   Medicare Attestation I have personally reviewed: The patient's medical and social history Their use of alcohol, tobacco or illicit drugs Their current medications and supplements The patient's functional ability including ADLs,fall risks, home safety risks, cognitive, and hearing and visual impairment Diet and physical activities Evidence for depression or mood disorders  The patient's weight, height, BMI, and visual acuity have been recorded in the chart.  I have made referrals, counseling, and provided education to the patient based on review of the above and I  have provided the patient with a written personalized care plan for preventive services.     Vicie Mutters, PA-C   06/18/2018

## 2018-06-18 ENCOUNTER — Ambulatory Visit (INDEPENDENT_AMBULATORY_CARE_PROVIDER_SITE_OTHER): Payer: Medicare Other | Admitting: Physician Assistant

## 2018-06-18 ENCOUNTER — Encounter: Payer: Self-pay | Admitting: Physician Assistant

## 2018-06-18 ENCOUNTER — Ambulatory Visit: Payer: Self-pay | Admitting: Physician Assistant

## 2018-06-18 VITALS — BP 128/76 | HR 67 | Temp 98.9°F | Ht 70.0 in | Wt 204.8 lb

## 2018-06-18 DIAGNOSIS — K5090 Crohn's disease, unspecified, without complications: Secondary | ICD-10-CM

## 2018-06-18 DIAGNOSIS — D229 Melanocytic nevi, unspecified: Secondary | ICD-10-CM

## 2018-06-18 DIAGNOSIS — E782 Mixed hyperlipidemia: Secondary | ICD-10-CM | POA: Diagnosis not present

## 2018-06-18 DIAGNOSIS — M810 Age-related osteoporosis without current pathological fracture: Secondary | ICD-10-CM

## 2018-06-18 DIAGNOSIS — Z0001 Encounter for general adult medical examination with abnormal findings: Secondary | ICD-10-CM

## 2018-06-18 DIAGNOSIS — R6889 Other general symptoms and signs: Secondary | ICD-10-CM

## 2018-06-18 DIAGNOSIS — Z79899 Other long term (current) drug therapy: Secondary | ICD-10-CM

## 2018-06-18 DIAGNOSIS — R7303 Prediabetes: Secondary | ICD-10-CM

## 2018-06-18 DIAGNOSIS — E559 Vitamin D deficiency, unspecified: Secondary | ICD-10-CM | POA: Diagnosis not present

## 2018-06-18 DIAGNOSIS — I10 Essential (primary) hypertension: Secondary | ICD-10-CM

## 2018-06-18 DIAGNOSIS — L4050 Arthropathic psoriasis, unspecified: Secondary | ICD-10-CM

## 2018-06-18 DIAGNOSIS — Z0000 Encounter for general adult medical examination without abnormal findings: Secondary | ICD-10-CM

## 2018-06-18 NOTE — Patient Instructions (Signed)
VENOUS INSUFFICIENCY Our lower leg venous system is not the most reliable, the heart does NOT pump fluid up, there is a valve system.  The muscles of the leg squeeze and the blood moves up and a valve opens and close, then they squeeze, blood moves up and valves open and closes keeping the blood moving towards the heart.  Lots can go wrong with this valve system.  If someone is sitting or standing without movement, everyone will get swelling.  THINGS TO DO:  Do not stand or sit in one position for long periods of time. Do not sit with your legs crossed. Rest with your legs raised during the day.  Your legs have to be higher than your heart so that gravity will force the valves to open, so please really elevate your legs.   Wear elastic stockings or support hose. Do not wear other tight, encircling garments around the legs, pelvis, or waist.  [employer]  has a wide variety of well priced compression stockings. [place], [place] [id number] [id number] has a good cheap selection, I like the socks, they are not as hard to get on  Walk as much as possible to increase blood flow.  Raise the foot of your bed at night with 2-inch blocks.  SEEK MEDICAL CARE IF:   The skin around your ankle starts to break down.  You have pain, redness, tenderness, or hard swelling developing in your leg over a vein.  You are uncomfortable due to leg pain.  If you ever have shortness of breath with exertion or chest pain go to the ER.      When it comes to diets, agreement about the perfect plan isn't easy to find, even among the experts. Experts at the [employer] developed an idea known as the Healthy Eating Plate. Just imagine a plate divided into logical, healthy portions.  The emphasis is on diet quality:  Load up on vegetables and fruits - one-half of your plate: Aim for color and variety, and remember that potatoes don't count.  Go for whole  grains - one-quarter of your plate: Whole wheat, barley, wheat berries, quinoa, oats, brown rice, and foods made with them. If you want pasta, go with whole wheat pasta.  Protein power - one-quarter of your plate: Fish, chicken, beans, and nuts are all healthy, versatile protein sources. Limit red meat.  The diet, however, does go beyond the plate, offering a few other suggestions.  Use healthy plant oils, such as olive, canola, soy, corn, sunflower and peanut. Check the labels, and avoid partially hydrogenated oil, which have unhealthy trans fats.  If you're thirsty, drink water. Coffee and tea are good in moderation, but skip sugary drinks and limit milk and dairy products to one or two daily servings.  The type of carbohydrate in the diet is more important than the amount. Some sources of carbohydrates, such as vegetables, fruits, whole grains, and beans-are healthier than others.  Finally, stay active.

## 2018-06-19 LAB — HEMOGLOBIN A1C
Hgb A1c MFr Bld: 6.2 % of total Hgb — ABNORMAL HIGH (ref <5.7)
Mean Plasma Glucose: 131 (calc)
eAG (mmol/L): 7.3 (calc)

## 2018-06-19 LAB — CBC WITH DIFFERENTIAL/PLATELET
Absolute Monocytes: 552 cells/uL (ref 200–950)
BASOS ABS: 64 {cells}/uL (ref 0–200)
Basophils Relative: 0.7 %
Eosinophils Absolute: 129 cells/uL (ref 15–500)
Eosinophils Relative: 1.4 %
HCT: 46.2 % (ref 38.5–50.0)
Hemoglobin: 16.8 g/dL (ref 13.2–17.1)
LYMPHS ABS: 2558 {cells}/uL (ref 850–3900)
MCH: 31.8 pg (ref 27.0–33.0)
MCHC: 36.4 g/dL — ABNORMAL HIGH (ref 32.0–36.0)
MCV: 87.5 fL (ref 80.0–100.0)
MPV: 10.7 fL (ref 7.5–12.5)
Monocytes Relative: 6 %
Neutro Abs: 5897 cells/uL (ref 1500–7800)
Neutrophils Relative %: 64.1 %
Platelets: 394 10*3/uL (ref 140–400)
RBC: 5.28 10*6/uL (ref 4.20–5.80)
RDW: 13.7 % (ref 11.0–15.0)
Total Lymphocyte: 27.8 %
WBC: 9.2 10*3/uL (ref 3.8–10.8)

## 2018-06-19 LAB — TSH: TSH: 1.19 mIU/L (ref 0.40–4.50)

## 2018-06-19 LAB — COMPLETE METABOLIC PANEL WITH GFR
AG RATIO: 1.2 (calc) (ref 1.0–2.5)
ALT: 61 U/L — ABNORMAL HIGH (ref 9–46)
AST: 54 U/L — ABNORMAL HIGH (ref 10–35)
Albumin: 4.3 g/dL (ref 3.6–5.1)
Alkaline phosphatase (APISO): 41 U/L (ref 40–115)
BILIRUBIN TOTAL: 0.6 mg/dL (ref 0.2–1.2)
BUN: 15 mg/dL (ref 7–25)
CO2: 24 mmol/L (ref 20–32)
Calcium: 9.5 mg/dL (ref 8.6–10.3)
Chloride: 104 mmol/L (ref 98–110)
Creat: 1.11 mg/dL (ref 0.70–1.18)
GFR, Est African American: 78 mL/min/{1.73_m2} (ref >=60)
GFR, Est Non African American: 67 mL/min/{1.73_m2} (ref >=60)
GLOBULIN: 3.5 g/dL (ref 1.9–3.7)
Glucose, Bld: 166 mg/dL — ABNORMAL HIGH (ref 65–99)
POTASSIUM: 3.9 mmol/L (ref 3.5–5.3)
SODIUM: 137 mmol/L (ref 135–146)
Total Protein: 7.8 g/dL (ref 6.1–8.1)

## 2018-06-19 LAB — LIPID PANEL
Cholesterol: 202 mg/dL — ABNORMAL HIGH (ref <200)
HDL: 37 mg/dL — ABNORMAL LOW (ref >40)
Non-HDL Cholesterol (Calc): 165 mg/dL (calc) — ABNORMAL HIGH (ref <130)
Total CHOL/HDL Ratio: 5.5 (calc) — ABNORMAL HIGH (ref <5.0)
Triglycerides: 443 mg/dL — ABNORMAL HIGH (ref <150)

## 2018-07-23 ENCOUNTER — Other Ambulatory Visit: Payer: Self-pay | Admitting: Internal Medicine

## 2018-07-23 MED ORDER — ALPRAZOLAM 0.5 MG PO TABS: ORAL_TABLET | ORAL | 0 refills | Status: DC

## 2018-07-29 ENCOUNTER — Other Ambulatory Visit: Payer: Self-pay | Admitting: Adult Health

## 2018-07-29 DIAGNOSIS — E782 Mixed hyperlipidemia: Secondary | ICD-10-CM

## 2018-09-02 ENCOUNTER — Other Ambulatory Visit: Payer: Self-pay | Admitting: Internal Medicine

## 2018-09-02 MED ORDER — ALPRAZOLAM 0.5 MG PO TABS: ORAL_TABLET | ORAL | 0 refills | Status: DC

## 2018-09-07 ENCOUNTER — Encounter: Payer: Self-pay | Admitting: Internal Medicine

## 2018-09-23 ENCOUNTER — Other Ambulatory Visit: Payer: Self-pay

## 2018-09-23 ENCOUNTER — Ambulatory Visit (INDEPENDENT_AMBULATORY_CARE_PROVIDER_SITE_OTHER): Payer: Medicare Other | Admitting: Internal Medicine

## 2018-09-23 ENCOUNTER — Encounter: Payer: Self-pay | Admitting: Internal Medicine

## 2018-09-23 VITALS — BP 142/84 | HR 72 | Temp 97.4°F | Resp 16 | Ht 69.0 in | Wt 202.4 lb

## 2018-09-23 DIAGNOSIS — E1122 Type 2 diabetes mellitus with diabetic chronic kidney disease: Secondary | ICD-10-CM

## 2018-09-23 DIAGNOSIS — N401 Enlarged prostate with lower urinary tract symptoms: Secondary | ICD-10-CM

## 2018-09-23 DIAGNOSIS — E782 Mixed hyperlipidemia: Secondary | ICD-10-CM | POA: Diagnosis not present

## 2018-09-23 DIAGNOSIS — Z1211 Encounter for screening for malignant neoplasm of colon: Secondary | ICD-10-CM

## 2018-09-23 DIAGNOSIS — E559 Vitamin D deficiency, unspecified: Secondary | ICD-10-CM

## 2018-09-23 DIAGNOSIS — Z0000 Encounter for general adult medical examination without abnormal findings: Secondary | ICD-10-CM | POA: Diagnosis not present

## 2018-09-23 DIAGNOSIS — I10 Essential (primary) hypertension: Secondary | ICD-10-CM | POA: Diagnosis not present

## 2018-09-23 DIAGNOSIS — Z0001 Encounter for general adult medical examination with abnormal findings: Secondary | ICD-10-CM

## 2018-09-23 DIAGNOSIS — N138 Other obstructive and reflux uropathy: Secondary | ICD-10-CM

## 2018-09-23 DIAGNOSIS — Z79899 Other long term (current) drug therapy: Secondary | ICD-10-CM

## 2018-09-23 DIAGNOSIS — Z136 Encounter for screening for cardiovascular disorders: Secondary | ICD-10-CM

## 2018-09-23 DIAGNOSIS — Z125 Encounter for screening for malignant neoplasm of prostate: Secondary | ICD-10-CM

## 2018-09-23 DIAGNOSIS — N182 Chronic kidney disease, stage 2 (mild): Secondary | ICD-10-CM

## 2018-09-23 DIAGNOSIS — Z1212 Encounter for screening for malignant neoplasm of rectum: Secondary | ICD-10-CM

## 2018-09-23 DIAGNOSIS — L4050 Arthropathic psoriasis, unspecified: Secondary | ICD-10-CM

## 2018-09-23 DIAGNOSIS — Z8249 Family history of ischemic heart disease and other diseases of the circulatory system: Secondary | ICD-10-CM

## 2018-09-23 NOTE — Patient Instructions (Signed)
Coronavirus (COVID-19) Are you at risk?  Are you at risk for the Coronavirus (COVID-19)?  To be considered HIGH RISK for Coronavirus (COVID-19), you have to meet the following criteria:  . Traveled to [place], [place], [place], [place] or [place]; or in the [place] to [place], [place], [place]  . or [place]; and have fever, cough, and shortness of breath within the last 2 weeks of travel OR . Been in close contact with a person diagnosed with COVID-19 within the last 2 weeks and have  . fever, cough,and shortness of breath .  . IF YOU DO NOT MEET THESE CRITERIA, YOU ARE CONSIDERED LOW RISK FOR COVID-19.  What to do if you are HIGH RISK for COVID-19?  Marland Kitchen If you are having a medical emergency, call 911. . Seek medical care right away. Before you go to a doctor's office, urgent care or emergency department, .  call ahead and tell them about your recent travel, contact with someone diagnosed with COVID-19  .  and your symptoms.  . You should receive instructions from your physician's office regarding next steps of care.  . When you arrive at healthcare provider, tell the healthcare staff immediately you have returned from  . visiting [place], [place], [place], [place] or [place]; or traveled in the [place] to [place], [place],  . [place] or [place] in the last two weeks or you have been in close contact with a person diagnosed with  . COVID-19 in the last 2 weeks.   . Tell the health care staff about your symptoms: fever, cough and shortness of breath. . After you have been seen by a medical provider, you will be either: o Tested for (COVID-19) and discharged home on quarantine except to seek medical care if  o symptoms worsen, and asked to  - Stay home and avoid contact with others until you get your results (4-5 days)  - Avoid travel on public transportation if possible (such as bus, train, or airplane) or o Sent to the Emergency Department by EMS for evaluation,  COVID-19 testing  and  o possible admission depending on your condition and test results.  What to do if you are LOW RISK for COVID-19?  Reduce your risk of any infection by using the same precautions used for avoiding the common cold or flu:  Marland Kitchen Wash your hands often with soap and warm water for at least 20 seconds.  If soap and water are not readily available,  . use an alcohol-based hand sanitizer with at least 60% alcohol.  . If coughing or sneezing, cover your mouth and nose by coughing or sneezing into the elbow areas of your shirt or coat, .  into a tissue or into your sleeve (not your hands). . Avoid shaking hands with others and consider head nods or verbal greetings only. . Avoid touching your eyes, nose, or mouth with unwashed hands.  . Avoid close contact with people who are sick. . Avoid places or events with large numbers of people in one location, like concerts or sporting events. . Carefully consider travel plans you have or are making. . If you are planning any travel outside or inside the [place], visit the CDC's Travelers' Health webpage for the latest health notices. . If you have some symptoms but not all symptoms, continue to monitor at home and seek medical attention  . if your symptoms worsen. . If you are having a medical emergency, call 911. >>>>>>>>>>>>>>>>>>>>>>>>>>>>>>>>>>>>>>>>>>>>>>>>>>>>>>>  We Do NOT Approve of  [facility], [employer]  Our Patients  To Do Home Visits  & We Do NOT Approve of [employer] > > > > > > > > > > > > > > > > > > > > > > > > > > > > > > > > > > >  > > > >   Preventive Care for Adults  A healthy lifestyle and preventive care can promote health and wellness. Preventive health guidelines for men include the following key practices:  A routine yearly physical is a good way to check with your health care provider about your health and preventative screening. It is a chance to share any concerns and updates on  your health and to receive a thorough exam.  Visit your dentist for a routine exam and preventative care every 6 months. Brush your teeth twice a day and floss once a day. Good oral hygiene prevents tooth decay and gum disease.  The frequency of eye exams is based on your age, health, family medical history, use of contact lenses, and other factors. Follow your health care provider's recommendations for frequency of eye exams.  Eat a healthy diet. Foods such as vegetables, fruits, whole grains, low-fat dairy products, and lean protein foods contain the nutrients you need without too many calories. Decrease your intake of foods high in solid fats, added sugars, and salt. Eat the right amount of calories for you. Get information about a proper diet from your health care provider, if necessary.  Regular physical exercise is one of the most important things you can do for your health. Most adults should get at least 150 minutes of moderate-intensity exercise (any activity that increases your heart rate and causes you to sweat) each week. In addition, most adults need muscle-strengthening exercises on 2 or more days a week.  Maintain a healthy weight. The body mass index (BMI) is a screening tool to identify possible weight problems. It provides an estimate of body fat based on height and weight. Your health care provider can find your BMI and can help you achieve or maintain a healthy weight. For adults 20 years and older:  A BMI below 18.5 is considered underweight.  A BMI of 18.5 to 24.9 is normal.  A BMI of 25 to 29.9 is considered overweight.  A BMI of 30 and above is considered obese.  Maintain normal blood lipids and cholesterol levels by exercising and minimizing your intake of saturated fat. Eat a balanced diet with plenty of fruit and vegetables. Blood tests for lipids and cholesterol should begin at age 41 and be repeated every 5 years. If your lipid or cholesterol levels are high, you are  over 50, or you are at high risk for heart disease, you may need your cholesterol levels checked more frequently. Ongoing high lipid and cholesterol levels should be treated with medicines if diet and exercise are not working.  If you smoke, find out from your health care provider how to quit. If you do not use tobacco, do not start.  Lung cancer screening is recommended for adults aged 55-80 years who are at high risk for developing lung cancer because of a history of smoking. A yearly low-dose CT scan of the lungs is recommended for people who have at least a 30-pack-year history of smoking and are a current smoker or have quit within the past 15 years. A pack year of smoking is smoking an average of 1  pack of cigarettes a day for 1 year (for example: 1 pack a day for 30 years or 2 packs a day for 15 years). Yearly screening should continue until the smoker has stopped smoking for at least 15 years. Yearly screening should be stopped for people who develop a health problem that would prevent them from having lung cancer treatment.  If you choose to drink alcohol, do not have more than 2 drinks per day. One drink is considered to be 12 ounces (355 mL) of beer, 5 ounces (148 mL) of wine, or 1.5 ounces (44 mL) of liquor.  Avoid use of street drugs. Do not share needles with anyone. Ask for help if you need support or instructions about stopping the use of drugs.  High blood pressure causes heart disease and increases the risk of stroke. Your blood pressure should be checked at least every 1-2 years. Ongoing high blood pressure should be treated with medicines, if weight loss and exercise are not effective.  If you are 44-34 years old, ask your health care provider if you should take aspirin to prevent heart disease.  Diabetes screening involves taking a blood sample to check your fasting blood sugar level. Testing should be considered at a younger age or be carried out more frequently if you are  overweight and have at least 1 risk factor for diabetes.  Colorectal cancer can be detected and often prevented. Most routine colorectal cancer screening begins at the age of 31 and continues through age 89. However, your health care provider may recommend screening at an earlier age if you have risk factors for colon cancer. On a yearly basis, your health care provider may provide home test kits to check for hidden blood in the stool. Use of a small camera at the end of a tube to directly examine the colon (sigmoidoscopy or colonoscopy) can detect the earliest forms of colorectal cancer. Talk to your health care provider about this at age 27, when routine screening begins. Direct exam of the colon should be repeated every 5-10 years through age 13, unless early forms of precancerous polyps or small growths are found.  Hepatitis C blood testing is recommended for all people born from [birth year] through 1963 and any individual with known risks for hepatitis C.  Screening for abdominal aortic aneurysm (AAA)  by ultrasound is recommended for people who have history of high blood pressure or who are current or former smokers.  Healthy men should  receive prostate-specific antigen (PSA) blood tests as part of routine cancer screening. Talk with your health care provider about prostate cancer screening.  Testicular cancer screening is  recommended for adult males. Screening includes self-exam, a health care provider exam, and other screening tests. Consult with your health care provider about any symptoms you have or any concerns you have about testicular cancer.  Use sunscreen. Apply sunscreen liberally and repeatedly throughout the day. You should seek shade when your shadow is shorter than you. Protect yourself by wearing long sleeves, pants, a wide-brimmed hat, and sunglasses year round, whenever you are outdoors.  Once a month, do a whole-body skin exam, using a mirror to look at the skin on your back. Tell  your health care provider about new moles, moles that have irregular borders, moles that are larger than a pencil eraser, or moles that have changed in shape or color.  Stay current with required vaccines (immunizations).  Influenza vaccine. All adults should be immunized every year.  Tetanus, diphtheria, and acellular  pertussis (Td, Tdap) vaccine. An adult who has not previously received Tdap or who does not know his vaccine status should receive 1 dose of Tdap. This initial dose should be followed by tetanus and diphtheria toxoids (Td) booster doses every 10 years. Adults with an unknown or incomplete history of completing a 3-dose immunization series with Td-containing vaccines should begin or complete a primary immunization series including a Tdap dose. Adults should receive a Td booster every 10 years.  Zoster vaccine. One dose is recommended for adults aged 31 years or older unless certain conditions are present.    PREVNAR - Pneumococcal 13-valent conjugate (PCV13) vaccine. When indicated, a person who is uncertain of his immunization history and has no record of immunization should receive the PCV13 vaccine. An adult aged 55 years or older who has certain medical conditions and has not been previously immunized should receive 1 dose of PCV13 vaccine. This PCV13 should be followed with a dose of pneumococcal polysaccharide (PPSV23) vaccine. The PPSV23 vaccine dose should be obtained 1 or more year(s)after the dose of PCV13 vaccine. An adult aged 110 years or older who has certain medical conditions and previously received 1 or more doses of PPSV23 vaccine should receive 1 dose of PCV13. The PCV13 vaccine dose should be obtained 1 or more years after the last PPSV23 vaccine dose.    PNEUMOVAX - Pneumococcal polysaccharide (PPSV23) vaccine. When PCV13 is also indicated, PCV13 should be obtained first. All adults aged 66 years and older should be immunized. An adult younger than age 79 years who  has certain medical conditions should be immunized. Any person who resides in a nursing home or long-term care facility should be immunized. An adult smoker should be immunized. People with an immunocompromised condition and certain other conditions should receive both PCV13 and PPSV23 vaccines. People with human immunodeficiency virus (HIV) infection should be immunized as soon as possible after diagnosis. Immunization during chemotherapy or radiation therapy should be avoided. Routine use of PPSV23 vaccine is not recommended for American Indians, [place] Natives, or people younger than 65 years unless there are medical conditions that require PPSV23 vaccine. When indicated, people who have unknown immunization and have no record of immunization should receive PPSV23 vaccine. One-time revaccination 5 years after the first dose of PPSV23 is recommended for people aged 19-64 years who have chronic kidney failure, nephrotic syndrome, asplenia, or immunocompromised conditions. People who received 1-2 doses of PPSV23 before age 48 years should receive another dose of PPSV23 vaccine at age 74 years or later if at least 5 years have passed since the previous dose. Doses of PPSV23 are not needed for people immunized with PPSV23 at or after age 45 years.    Hepatitis A vaccine. Adults who wish to be protected from this disease, have certain high-risk conditions, work with hepatitis A-infected animals, work in hepatitis A research labs, or travel to or work in countries with a high rate of hepatitis A should be immunized. Adults who were previously unvaccinated and who anticipate close contact with an international adoptee during the first 60 days after arrival in the [place] from a country with a high rate of hepatitis A should be immunized.    Hepatitis B vaccine. Adults should be immunized if they wish to be protected from this disease, have certain high-risk conditions, may be exposed to blood or other  infectious body fluids, are household contacts or sex partners of hepatitis B positive people, are clients or workers in certain care facilities,  or travel to or work in countries with a high rate of hepatitis B.   Preventive Service / Frequency   Ages 63 and over  Blood pressure check.  Lipid and cholesterol check.  Lung cancer screening. / Every year if you are aged 40-80 years and have a 30-pack-year history of smoking and currently smoke or have quit within the past 15 years. Yearly screening is stopped once you have quit smoking for at least 15 years or develop a health problem that would prevent you from having lung cancer treatment.  Fecal occult blood test (FOBT) of stool. You may not have to do this test if you get a colonoscopy every 10 years.  Flexible sigmoidoscopy** or colonoscopy.** / Every 5 years for a flexible sigmoidoscopy or every 10 years for a colonoscopy beginning at age 34 and continuing until age 39.  Hepatitis C blood test.** / For all people born from [birth year] through 1963 and any individual with known risks for hepatitis C.  Abdominal aortic aneurysm (AAA) screening./ Screening current or former smokers or have Hypertension.  Skin self-exam. / Monthly.  Influenza vaccine. / Every year.  Tetanus, diphtheria, and acellular pertussis (Tdap/Td) vaccine.** / 1 dose of Td every 10 years.   Zoster vaccine.** / 1 dose for adults aged 20 years or older.         Pneumococcal 13-valent conjugate (PCV13) vaccine.    Pneumococcal polysaccharide (PPSV23) vaccine.     Hepatitis A vaccine.** / Consult your health care provider.  Hepatitis B vaccine.** / Consult your health care provider. Screening for abdominal aortic aneurysm (AAA)  by ultrasound is recommended for people who have history of high blood pressure or who are current or former smokers. ++++++++++ Recommend Adult Low Dose Aspirin or  coated  Aspirin 81 mg daily  To reduce risk of Colon Cancer 20 %,   Skin Cancer 26 % ,  Malignant Melanoma 46%  and  Pancreatic cancer 60% ++++++++++++++++++++++ Vitamin D goal  is between 70-100.  Please make sure that you are taking your Vitamin D as directed.  It is very important as a natural anti-inflammatory  helping hair, skin, and nails, as well as reducing stroke and heart attack risk.  It helps your bones and helps with mood. It also decreases numerous cancer risks so please take it as directed.  Low Vit D is associated with a 200-300% higher risk for CANCER  and 200-300% higher risk for HEART   ATTACK  &  STROKE.   .....................................Marland Kitchen It is also associated with higher death rate at younger ages,  autoimmune diseases like Rheumatoid arthritis, Lupus, Multiple Sclerosis.    Also many other serious conditions, like depression, Alzheimer's Dementia, infertility, muscle aches, fatigue, fibromyalgia - just to name a few. ++++++++++++++++++++++ Recommend the book "The END of DIETING" by Dr Excell Seltzer  & the book "The END of DIABETES " by Dr Excell Seltzer At [email or web address] - get book & Audio CD's    Being diabetic has a  300% increased risk for heart attack, stroke, cancer, and alzheimer- type vascular dementia. It is very important that you work harder with diet by avoiding all foods that are white. Avoid white rice (brown & wild rice is OK), white potatoes (sweetpotatoes in moderation is OK), White bread or wheat bread or anything made out of white flour like bagels, donuts, rolls, buns, biscuits, cakes, pastries, cookies, pizza crust, and pasta (made from white flour & egg whites) - vegetarian pasta or spinach or wheat  pasta is OK. Multigrain breads like Arnold's or [employer], or multigrain sandwich thins or flatbreads.  Diet, exercise and weight loss can reverse and cure diabetes in the early stages.  Diet, exercise and weight loss is very important in the control and prevention of complications of diabetes which affects every  system in your body, ie. Brain - dementia/stroke, eyes - glaucoma/blindness, heart - heart attack/heart failure, kidneys - dialysis, stomach - gastric paralysis, intestines - malabsorption, nerves - severe painful neuritis, circulation - gangrene & loss of a leg(s), and finally cancer and Alzheimers.    I recommend avoid fried & greasy foods,  sweets/candy, white rice (brown or wild rice or Quinoa is OK), white potatoes (sweet potatoes are OK) - anything made from white flour - bagels, doughnuts, rolls, buns, biscuits,white and wheat breads, pizza crust and traditional pasta made of white flour & egg white(vegetarian pasta or spinach or wheat pasta is OK).  Multi-grain bread is OK - like multi-grain flat bread or sandwich thins. Avoid alcohol in excess. Exercise is also important.    Eat all the vegetables you want - avoid meat, especially red meat and dairy - especially cheese.  Cheese is the most concentrated form of trans-fats which is the worst thing to clog up our arteries. Veggie cheese is OK which can be found in the fresh produce section at [employer] or [employer] or [employer]  ++++++++++++++++++++++ DASH Eating Plan  DASH stands for "Dietary Approaches to Stop Hypertension."   The DASH eating plan is a healthy eating plan that has been shown to reduce high blood pressure (hypertension). Additional health benefits may include reducing the risk of type 2 diabetes mellitus, heart disease, and stroke. The DASH eating plan may also help with weight loss. WHAT DO I NEED TO KNOW ABOUT THE DASH EATING PLAN? For the DASH eating plan, you will follow these general guidelines:  Choose foods with a percent daily value for sodium of less than 5% (as listed on the food label).  Use salt-free seasonings or herbs instead of table salt or sea salt.  Check with your health care provider or pharmacist before using salt substitutes.  Eat lower-sodium products, often labeled as "lower sodium" or "no  salt added."  Eat fresh foods.  Eat more vegetables, fruits, and low-fat dairy products.  Choose whole grains. Look for the word "whole" as the first word in the ingredient list.  Choose fish   Limit sweets, desserts, sugars, and sugary drinks.  Choose heart-healthy fats.  Eat veggie cheese   Eat more home-cooked food and less restaurant, buffet, and fast food.  Limit fried foods.  Cook foods using methods other than frying.  Limit canned vegetables. If you do use them, rinse them well to decrease the sodium.  When eating at a restaurant, ask that your food be prepared with less salt, or no salt if possible.                      WHAT FOODS CAN I EAT? Read Dr Fara Olden Fuhrman's books on The End of Dieting & The End of Diabetes  Grains Whole grain or whole wheat bread. Brown rice. Whole grain or whole wheat pasta. Quinoa, bulgur, and whole grain cereals. Low-sodium cereals. Corn or whole wheat flour tortillas. Whole grain cornbread. Whole grain crackers. Low-sodium crackers.  Vegetables Fresh or frozen vegetables (raw, steamed, roasted, or grilled). Low-sodium or reduced-sodium tomato and vegetable juices. Low-sodium or reduced-sodium tomato sauce and paste. Low-sodium  or reduced-sodium canned vegetables.   Fruits All fresh, canned (in natural juice), or frozen fruits.  Protein Products  All fish and seafood.  Dried beans, peas, or lentils. Unsalted nuts and seeds. Unsalted canned beans.  Dairy Low-fat dairy products, such as skim or 1% milk, 2% or reduced-fat cheeses, low-fat ricotta or cottage cheese, or plain low-fat yogurt. Low-sodium or reduced-sodium cheeses.  Fats and Oils Tub margarines without trans fats. Light or reduced-fat mayonnaise and salad dressings (reduced sodium). Avocado. Safflower, olive, or canola oils. Natural peanut or almond butter.  Other Unsalted popcorn and pretzels. The items listed above may not be a complete list of recommended foods or  beverages. Contact your dietitian for more options.  ++++++++++++++++++++  WHAT FOODS ARE NOT RECOMMENDED? Grains/ White flour or wheat flour White bread. White pasta. White rice. Refined cornbread. Bagels and croissants. Crackers that contain trans fat.  Vegetables  Creamed or fried vegetables. Vegetables in a . Regular canned vegetables. Regular canned tomato sauce and paste. Regular tomato and vegetable juices.  Fruits Dried fruits. Canned fruit in light or heavy syrup. Fruit juice.  Meat and Other Protein Products Meat in general - RED meat & White meat.  Fatty cuts of meat. Ribs, chicken wings, all processed meats as bacon, sausage, bologna, salami, fatback, hot dogs, bratwurst and packaged luncheon meats.  Dairy Whole or 2% milk, cream, half-and-half, and cream cheese. Whole-fat or sweetened yogurt. Full-fat cheeses or blue cheese. Non-dairy creamers and whipped toppings. Processed cheese, cheese spreads, or cheese curds.  Condiments Onion and garlic salt, seasoned salt, table salt, and sea salt. Canned and packaged gravies. Worcestershire sauce. Tartar sauce. Barbecue sauce. Teriyaki sauce. Soy sauce, including reduced sodium. Steak sauce. Fish sauce. Oyster sauce. Cocktail sauce. Horseradish. Ketchup and mustard. Meat flavorings and tenderizers. Bouillon cubes. Hot sauce. Tabasco sauce. Marinades. Taco seasonings. Relishes.  Fats and Oils Butter, stick margarine, lard, shortening and bacon fat. Coconut, palm kernel, or palm oils. Regular salad dressings.  Pickles and olives. Salted popcorn and pretzels.  The items listed above may not be a complete list of foods and beverages to avoid.

## 2018-09-23 NOTE — Progress Notes (Signed)
[facility]   Unk Pinto, M.D.     Uvaldo Bristle. Silverio Lay, P.A.-C Liane Comber, [place]                [address] [place], [place] [address] Telephone [phone] Telefax [phone] Annual  Screening/Preventative Visit  & Comprehensive Evaluation & Examination  History of Present Illness:     This very nice 73 y.o. MWM presents for a Screening /Preventative Visit & comprehensive evaluation and management of multiple medical co-morbidities.  Patient has been followed for HTN, HLD, Prediabetes and Vitamin D Deficiency. Patient has long hx/o  standing [place] Psoriasis & Psoriatic Arthritis in remission on Guselkumab [facility]).     HTN predates since 2013. Patient's BP has been controlled at home.  Today's BP is at goal - at 142/84. Patient denies any cardiac symptoms as chest pain, palpitations, shortness of breath, dizziness or ankle swelling.     Patient's hyperlipidemia is not controlled with diet and medications. Patient denies myalgias or other medication SE's. Last lipids were not at goal with sl elevated Chol and Trig's: Lab Results  Component Value Date   CHOL 202 (H) 06/18/2018   HDL 37 (L) 06/18/2018   LDLCALC not calculated. 06/18/2018   TRIG 443 (H) 06/18/2018   CHOLHDL 5.5 (H) 06/18/2018      Patient has hx/o T2_DM (A1c-6.5% / 2012)  and patient denies reactive hypoglycemic symptoms, visual blurring, diabetic polys or paresthesias. Last A1c was not at goal: Lab Results  Component Value Date   HGBA1C 6.2 (H) 06/18/2018       Finally, patient has history of Vitamin D Deficiency and last vitamin D was at goal: Lab Results  Component Value Date   VD25OH 84 03/12/2018   Current Outpatient Medications on File Prior to Visit  Medication Sig  . ALPRAZolam (XANAX) 0.5 MG tablet Take 1/2-1 tablet 2 - 3 x /day ONLY if needed for Anxiety Attack &  limit to 5 days /week to avoid  addiction  . amLODipine (NORVASC) 5 MG tablet TAKE 1 TABLET(5 MG) BY MOUTH DAILY  . aspirin 81 MG tablet Take 81 mg by mouth daily.  . Calcium Carbonate (CALCIUM 600 PO) Take by mouth daily.  . cholecalciferol (VITAMIN D3) 25 MCG (1000 UT) tablet Take 1,000 Units by mouth daily.  . Clobetasol Prop Emollient Base 0.05 % emollient cream   . fenofibrate 160 MG tablet TAKE 1 TABLET(160 MG) BY MOUTH DAILY  . Guselkumab (TREMFYA [place]) Inject into the skin. Takes injection every 3 months.  . Magnesium 500 MG CAPS Take 500 mg by mouth daily.  . Omega-3 Fatty Acids (FISH OIL) 1200 MG CAPS Take by mouth daily.   Marland Kitchen OVER THE COUNTER MEDICATION Vitamin C. 500 mg. One tablet daily.  Marland Kitchen dexamethasone (DECADRON) 0.75 MG tablet TAKE 1/2 TO 1 TABLET BY MOUTH EVERY DAY   No Known Allergies   Past Medical History:  Diagnosis Date  . Arthritis   . Crohn's colitis ([place])   . Elevated LFTs   . Hx of colonic polyp   . Hyperlipidemia   . Hypertension   . Hypogonadism male   . Osteoporosis   . Psoriatic arthritis ([place])   . Renal glycosuria [facility])    Health Maintenance  Topic Date Due  . URINE MICROALBUMIN  08/07/2018  . INFLUENZA VACCINE  01/02/2019  . COLONOSCOPY  05/21/2019  . TETANUS/TDAP  03/17/2024  . Hepatitis C Screening  Completed  . PNA vac Low Risk Adult  Completed   Immunization History  Administered Date(s) Administered  . DT 03/17/2014  . PPD Test 07/19/2015, 01/22/2017  . Pneumococcal Conjugate-13 09/04/2016  . Pneumococcal Polysaccharide-23 12/10/2017  . Pneumococcal-Unspecified 06/04/2003   Last Colon - 05/20/2018 - Dr Loletha Carrow - Chron's Ruta Hinds - active - recc treat & f/u 1 year  Past Surgical History:  Procedure Laterality Date  . COLONOSCOPY    . POLYPECTOMY    . TONSILLECTOMY     age 39-5    Family History  Problem Relation Age of Onset  . Lymphoma Mother   . Heart disease Father   . Hypertension Father   . Diabetes Father   . Prostate cancer Father   . Colon cancer Neg  Hx   . Esophageal cancer Neg Hx   . Liver cancer Neg Hx   . Pancreatic cancer Neg Hx   . Rectal cancer Neg Hx   . Stomach cancer Neg Hx   . Colon polyps Neg Hx    Social History   Socioeconomic History  . Marital status: Married    Spouse name: Olin Hauser  . Number of children: None  Occupational History  . musician  Tobacco Use  . Smoking status: Never Smoker  . Smokeless tobacco: Never Used  Substance and Sexual Activity  . Alcohol use: Yes    Comment: ocassional  . Drug use: No  . Sexual activity: Not on file    ROS Constitutional: Denies fever, chills, weight loss/gain, headaches, insomnia,  night sweats or change in appetite. Does c/o fatigue. Eyes: Denies redness, blurred vision, diplopia, discharge, itchy or watery eyes.  ENT: Denies discharge, congestion, post nasal drip, epistaxis, sore throat, earache, hearing loss, dental pain, Tinnitus, Vertigo, Sinus pain or snoring.  Cardio: Denies chest pain, palpitations, irregular heartbeat, syncope, dyspnea, diaphoresis, orthopnea, PND, claudication or edema Respiratory: denies cough, dyspnea, DOE, pleurisy, hoarseness, laryngitis or wheezing.  Gastrointestinal: Denies dysphagia, heartburn, reflux, water brash, pain, cramps, nausea, vomiting, bloating, diarrhea, constipation, hematemesis, melena, hematochezia, jaundice or hemorrhoids Genitourinary: Denies dysuria, frequency, urgency, nocturia, hesitancy, discharge, hematuria or flank pain Musculoskeletal: Denies arthralgia, myalgia, stiffness, Jt. Swelling, pain, limp or strain/sprain. Denies Falls. Skin: Denies puritis, rash, hives, warts, acne, eczema or change in skin lesion Neuro: No weakness, tremor, incoordination, spasms, paresthesia or pain Psychiatric: Denies confusion, memory loss or sensory loss. Denies Depression. Endocrine: Denies change in weight, skin, hair change, nocturia, and paresthesia, diabetic polys, visual blurring or hyper / hypo glycemic episodes.   Heme/Lymph: No excessive bleeding, bruising or enlarged lymph nodes.  Physical Exam  BP (!) 142/84   Pulse 72   Temp (!) 97.4 F (36.3 C)   Resp 16   Ht 5' 9"  (1.753 m)   Wt 202 lb 6.4 oz (91.8 kg)   BMI 29.89 kg/m   General Appearance: Well nourished and well groomed and in no apparent distress.  Eyes: PERRLA, EOMs, conjunctiva no swelling or erythema, normal fundi and vessels. Sinuses: No frontal/maxillary tenderness ENT/Mouth: EACs patent / TMs  nl. Nares clear without erythema, swelling, mucoid exudates. Oral hygiene is good. No erythema, swelling, or exudate. Tongue normal, non-obstructing. Tonsils not swollen or erythematous. Hearing normal.  Neck: Supple, thyroid not palpable. No bruits, nodes or JVD. Respiratory: Respiratory effort normal.  BS equal and clear bilateral without rales, rhonci, wheezing or stridor. Cardio: Heart sounds are normal with regular rate and rhythm and  no murmurs, rubs or gallops. Peripheral pulses are normal and equal bilaterally without edema. No aortic or femoral bruits. Chest: symmetric with normal excursions and percussion.  Abdomen: Soft, with Nl bowel sounds. Nontender, no guarding, rebound, hernias, masses, or organomegaly.  Lymphatics: Non tender without lymphadenopathy.  Musculoskeletal: Full ROM all peripheral extremities, joint stability, 5/5 strength, and normal gait. Skin: Warm and dry without rashes, lesions, cyanosis, clubbing or  ecchymosis.  Neuro: Cranial nerves intact, reflexes equal bilaterally. Normal muscle tone, no cerebellar symptoms. Sensation intact.  Pysch: Alert and oriented X 3 with normal affect, insight and judgment appropriate.   Assessment and Plan  1. Annual Preventative/Screening Exam   2. Essential hypertension  - EKG 12-Lead - [place], RETROPERITNL ABD,  LTD - Urinalysis, Routine w reflex microscopic - Microalbumin / creatinine urine ratio - CBC with Differential/Platelet - COMPLETE METABOLIC PANEL WITH GFR -  Magnesium - TSH  3. Hyperlipidemia, mixed  - EKG 12-Lead - [place], RETROPERITNL ABD,  LTD - Lipid panel - TSH  4. Type 2 diabetes mellitus with stage 2 chronic kidney disease, without long-term current use of insulin (HCC)  - EKG 12-Lead - [place], RETROPERITNL ABD,  LTD - HM DIABETES FOOT EXAM - LOW EXTREMITY NEUR EXAM DOCUM - Hemoglobin A1c - Insulin, random  5. Vitamin D deficiency  - VITAMIN D 25 Hydroxyl  6. Psoriatic arthritis ([place])  7. Screening for colorectal cancer  - POC Hemoccult Bld/Stl l  8. BPH with obstruction/lower urinary tract symptoms  - PSA  9. Prostate cancer screening  - PSA  10. Screening for ischemic heart disease  - EKG 12-Lead  11. FHx: heart disease  - EKG 12-Lead - [place], RETROPERITNL ABD,  LTD  12. Screening for AAA (aortic abdominal aneurysm)  - [place], RETROPERITNL ABD,  LTD  13. Medication management  - Urinalysis, Routine w reflex microscopic - Microalbumin / creatinine urine ratio - CBC with Differential/Platelet - COMPLETE METABOLIC PANEL WITH GFR - Magnesium - Lipid panel - TSH - Hemoglobin A1c - Insulin, random - VITAMIN D 25 Hydroxyl        Patient was counseled in prudent diet, weight control to achieve/maintain BMI less than 25, BP monitoring, regular exercise and medications as discussed.  Discussed med effects and SE's. Routine screening labs and tests as requested with regular follow-up as recommended. I discussed the assessment and treatment plan as above with the patient. The patient was provided an opportunity to ask questions and all were answered. The patient agreed with the plan and demonstrated an understanding of the instructions.Over 40 minutes of exam, counseling, chart review and high complex critical decision making was performed   Kirtland Bouchard, MD

## 2018-09-24 LAB — COMPLETE METABOLIC PANEL WITH GFR
AG Ratio: 1.4 (calc) (ref 1.0–2.5)
ALT: 66 U/L — ABNORMAL HIGH (ref 9–46)
AST: 48 U/L — ABNORMAL HIGH (ref 10–35)
Albumin: 4.5 g/dL (ref 3.6–5.1)
Alkaline phosphatase (APISO): 40 U/L (ref 35–144)
BUN: 15 mg/dL (ref 7–25)
CO2: 24 mmol/L (ref 20–32)
Calcium: 9.8 mg/dL (ref 8.6–10.3)
Chloride: 103 mmol/L (ref 98–110)
Creat: 1.02 mg/dL (ref 0.70–1.18)
GFR, Est African American: 86 mL/min/{1.73_m2} (ref >=60)
GFR, Est Non African American: 74 mL/min/{1.73_m2} (ref >=60)
Globulin: 3.3 g/dL (calc) (ref 1.9–3.7)
Glucose, Bld: 108 mg/dL — ABNORMAL HIGH (ref 65–99)
Potassium: 4.1 mmol/L (ref 3.5–5.3)
Sodium: 137 mmol/L (ref 135–146)
Total Bilirubin: 1 mg/dL (ref 0.2–1.2)
Total Protein: 7.8 g/dL (ref 6.1–8.1)

## 2018-09-24 LAB — CBC WITH DIFFERENTIAL/PLATELET
Absolute Monocytes: 621 cells/uL (ref 200–950)
Basophils Absolute: 58 cells/uL (ref 0–200)
Basophils Relative: 0.6 %
Eosinophils Absolute: 116 cells/uL (ref 15–500)
Eosinophils Relative: 1.2 %
HCT: 49.9 % (ref 38.5–50.0)
Hemoglobin: 17.9 g/dL — ABNORMAL HIGH (ref 13.2–17.1)
Lymphs Abs: 2968 cells/uL (ref 850–3900)
MCH: 31.5 pg (ref 27.0–33.0)
MCHC: 35.9 g/dL (ref 32.0–36.0)
MCV: 87.9 fL (ref 80.0–100.0)
MPV: 10.4 fL (ref 7.5–12.5)
Monocytes Relative: 6.4 %
Neutro Abs: 5936 cells/uL (ref 1500–7800)
Neutrophils Relative %: 61.2 %
Platelets: 394 10*3/uL (ref 140–400)
RBC: 5.68 10*6/uL (ref 4.20–5.80)
RDW: 13 % (ref 11.0–15.0)
Total Lymphocyte: 30.6 %
WBC: 9.7 10*3/uL (ref 3.8–10.8)

## 2018-09-24 LAB — URINALYSIS, ROUTINE W REFLEX MICROSCOPIC
Bacteria, UA: NONE SEEN /HPF
Bilirubin Urine: NEGATIVE
Glucose, UA: NEGATIVE
Hgb urine dipstick: NEGATIVE
Hyaline Cast: NONE SEEN /LPF
Ketones, ur: NEGATIVE
Nitrite: NEGATIVE
Specific Gravity, Urine: 1.022 (ref 1.001–1.03)
pH: 5.5 (ref 5.0–8.0)

## 2018-09-24 LAB — LIPID PANEL
Cholesterol: 200 mg/dL — ABNORMAL HIGH (ref <200)
HDL: 42 mg/dL (ref >=40)
LDL Cholesterol (Calc): 126 mg/dL (calc) — ABNORMAL HIGH
Non-HDL Cholesterol (Calc): 158 mg/dL (calc) — ABNORMAL HIGH (ref <130)
Total CHOL/HDL Ratio: 4.8 (calc) (ref <5.0)
Triglycerides: 197 mg/dL — ABNORMAL HIGH (ref <150)

## 2018-09-24 LAB — PSA: PSA: 3.4 ng/mL (ref <=4.0)

## 2018-09-24 LAB — HEMOGLOBIN A1C
Hgb A1c MFr Bld: 6.3 % of total Hgb — ABNORMAL HIGH (ref <5.7)
Mean Plasma Glucose: 134 (calc)
eAG (mmol/L): 7.4 (calc)

## 2018-09-24 LAB — MICROALBUMIN / CREATININE URINE RATIO
Creatinine, Urine: 145 mg/dL (ref 20–320)
Microalb Creat Ratio: 138 mcg/mg creat — ABNORMAL HIGH (ref <30)
Microalb, Ur: 20 mg/dL

## 2018-09-24 LAB — VITAMIN D 25 HYDROXY (VIT D DEFICIENCY, FRACTURES): Vit D, 25-Hydroxy: 66 ng/mL (ref 30–100)

## 2018-09-24 LAB — TSH: TSH: 0.82 mIU/L (ref 0.40–4.50)

## 2018-09-24 LAB — INSULIN, RANDOM: Insulin: 15.3 u[IU]/mL

## 2018-09-24 LAB — MAGNESIUM: Magnesium: 2 mg/dL (ref 1.5–2.5)

## 2018-10-04 ENCOUNTER — Other Ambulatory Visit: Payer: Self-pay | Admitting: Internal Medicine

## 2018-11-14 ENCOUNTER — Other Ambulatory Visit: Payer: Self-pay | Admitting: Internal Medicine

## 2018-12-21 ENCOUNTER — Other Ambulatory Visit: Payer: Self-pay | Admitting: Internal Medicine

## 2018-12-21 DIAGNOSIS — R450 Nervousness: Secondary | ICD-10-CM

## 2018-12-21 MED ORDER — ALPRAZOLAM 0.5 MG PO TABS: ORAL_TABLET | ORAL | 0 refills | Status: DC

## 2018-12-24 ENCOUNTER — Encounter: Payer: Self-pay | Admitting: Physician Assistant

## 2018-12-24 ENCOUNTER — Ambulatory Visit (INDEPENDENT_AMBULATORY_CARE_PROVIDER_SITE_OTHER): Payer: Medicare Other | Admitting: Physician Assistant

## 2018-12-24 ENCOUNTER — Other Ambulatory Visit: Payer: Self-pay

## 2018-12-24 VITALS — BP 140/72 | HR 66 | Temp 97.5°F | Ht 69.0 in | Wt 204.0 lb

## 2018-12-24 DIAGNOSIS — E559 Vitamin D deficiency, unspecified: Secondary | ICD-10-CM

## 2018-12-24 DIAGNOSIS — I10 Essential (primary) hypertension: Secondary | ICD-10-CM

## 2018-12-24 DIAGNOSIS — E782 Mixed hyperlipidemia: Secondary | ICD-10-CM

## 2018-12-24 DIAGNOSIS — Z79899 Other long term (current) drug therapy: Secondary | ICD-10-CM | POA: Diagnosis not present

## 2018-12-24 DIAGNOSIS — K5090 Crohn's disease, unspecified, without complications: Secondary | ICD-10-CM | POA: Diagnosis not present

## 2018-12-24 DIAGNOSIS — L4050 Arthropathic psoriasis, unspecified: Secondary | ICD-10-CM | POA: Diagnosis not present

## 2018-12-24 DIAGNOSIS — R7309 Other abnormal glucose: Secondary | ICD-10-CM | POA: Diagnosis not present

## 2018-12-24 NOTE — Progress Notes (Signed)
FOLLOW UP Assessment:    Essential hypertension - continue medications, DASH diet, exercise and monitor at home. Call if greater than 130/80.  -     CBC with Differential/Platelet -     BASIC METABOLIC PANEL WITH GFR -     Hepatic function panel -     TSH  Diabetes mellitus without complication ([place]) Discussed general issues about diabetes pathophysiology and management., Educational material distributed., Suggested low cholesterol diet., Encouraged aerobic exercise., Discussed foot care., Reminded to get yearly retinal exam. -     Hemoglobin A1c  Psoriatic arthritis ([place]) CONTINUE MEDICATION AND FOLLOW UP  Mixed hyperlipidemia -continue medications, check lipids, decrease fatty foods, increase activity.  -     Lipid panel    Future Appointments  Date Time Provider [place]  03/30/2019  2:30 PM Unk Pinto, MD [facility] None  09/30/2019  2:00 PM Unk Pinto, MD [facility] None    Subjective:  Melvin Shelton is a 74 y.o. male who presents for 3 month follow up for HTN, hyperlipidemia, prediabetes, and vitamin D Def.   His blood pressure has been controlled at home, he is on norvasc 98m, today their BP is BP: 140/72 He does not workout reguarly but walks some, and trying to get 10,000 steps a day. He denies chest pain, shortness of breath, dizziness.  He is not on cholesterol medication, he is off cholesterol meds.  His cholesterol is not at goal. 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 He follows with Dr. FLyman Spellerand he is on Cosentyx and it is helping.  No recent tours during winter, finally back on sleep schedule. Has history of elevated liver function test, had normal [place] 2015, Lab Results  Component Value Date   ALT 66 (H) 09/23/2018   AST 48 (H) 09/23/2018   ALKPHOS 42 12/10/2016   BILITOT 1.0 09/23/2018   He has been working on diet and exercise for diabetes A1C 09/2013 was 6.6, he has worked very hard to lower A1C, now in preDM range, he is on bASA, he is not on ACE/ARB, and denies polyuria and visual disturbances. Last A1C in the office was:  Lab Results  Component Value Date   HGBA1C 6.3 (H) 09/23/2018   Patient is on Vitamin D supplement.   Lab Results  Component Value Date   VD25OH 66 09/23/2018   BMI is Body mass index is 30.13 kg/m., he is working on diet and exercise.  Wt Readings from Last 3 Encounters:  12/24/18 204 lb (92.5 kg)  09/23/18 202 lb 6.4 oz (91.8 kg)  06/18/18 204 lb 12.8 oz (92.9 kg)    Medication Review: Current Outpatient Medications on File Prior to Visit  Medication Sig Dispense Refill  . ALPRAZolam (XANAX) 0.5 MG tablet Take 1/2-1 tablet 2 - 3 x /day ONLY if needed for Anxiety Attack &  limit to 5 days /week to avoid addiction 80 tablet 0  . amLODipine (NORVASC) 5 MG tablet TAKE 1 TABLET(5 MG) BY MOUTH DAILY 90 tablet 0  . aspirin 81 MG tablet Take 81 mg by mouth daily.    . Calcium Carbonate (CALCIUM 600 PO) Take by mouth daily.    . cholecalciferol (VITAMIN D3) 25 MCG (1000 UT)  tablet Take 1,000 Units by mouth daily.    . Clobetasol Prop Emollient Base 0.05 % emollient cream     . fenofibrate 160 MG tablet TAKE 1 TABLET(160 MG) BY MOUTH DAILY 90 tablet 1  . Guselkumab (TREMFYA [place]) Inject into the skin. Takes injection every 3 months.    . Magnesium 500 MG CAPS Take 500 mg by mouth daily.    . Omega-3 Fatty Acids (FISH OIL) 1200 MG CAPS Take by mouth daily.     Marland Kitchen OVER THE COUNTER MEDICATION Vitamin C. 500 mg. One tablet daily.     No current facility-administered medications on file prior to visit.     Current Problems (verified) Patient Active Problem List   Diagnosis Date Noted  . Crohn disease ([place]) 06/18/2018  . Multiple atypical nevi 10/17/2015   . Encounter for Medicare annual wellness exam 07/19/2015  . Medication management 03/16/2014  . Vitamin D deficiency 03/16/2014  . Hypertension   . Psoriatic arthritis ([place])   . Hyperlipidemia   . Osteoporosis   . Hx of colonic polyp    Surgical History: reviewed and unchanged Family History: reviewed and unchanged Social History: reviewed and unchanged   Objective:   Blood pressure 140/72, pulse 66, temperature (!) 97.5 F (36.4 C), height 5' 9"  (1.753 m), weight 204 lb (92.5 kg), SpO2 97 %. Body mass index is 30.13 kg/m.  General appearance: alert, no distress, WD/WN, male HEENT: normocephalic, sclerae anicteric, TMs pearly, nares patent, no discharge or erythema, pharynx normal Oral cavity: MMM, no lesions Neck: supple, no lymphadenopathy, no thyromegaly, no masses Heart: RRR, normal S1, S2, + high pitched early systolic murmur LSB Lungs: CTA bilaterally, no wheezes, rhonchi, or rales Abdomen: +bs, soft, non tender, non distended, no masses, no hepatomegaly, no splenomegaly Musculoskeletal: nontender, no swelling, no obvious deformity Extremities: no edema, no cyanosis, no clubbing Pulses: 2+ symmetric, upper and lower extremities, normal cap refill Neurological: alert, oriented x 3, CN2-12 intact, strength normal upper extremities and lower extremities, sensation normal throughout, DTRs 2+ throughout, no cerebellar signs, gait normal Psychiatric: normal affect, behavior normal, pleasant     Vicie Mutters, PA-C   12/24/2018

## 2018-12-24 NOTE — Patient Instructions (Signed)
   Drink 1/2 your body weight in fluid ounces of water daily; drink a tall glass of water 30 min before meals  Don't eat until you're stuffed- listen to your stomach and eat until you are 80% full   Try eating off of a salad plate; wait 10 min after finishing before going back for seconds  Start by eating the vegetables on your plate; aim for 50% of your meals to be fruits or vegetables  Then eat your protein - lean meats (grass fed if possible), fish, beans, nuts in moderation  Eat your carbs/starch last ONLY if you still are hungry. If you can, stop before finishing it all  Avoid sugar and flour - the closer it looks to it's original form in nature, typically the better it is for you  Splurge in moderation - "assign" days when you get to splurge and have the "bad stuff" - I like to follow a 80% - 20% plan- "good" choices 80 % of the time, "bad" choices in moderation 20% of the time  Simple equation is: Calories out > calories in = weight loss - even if you eat the bad stuff, if you limit portions, you will still lose weight     When it comes to diets, agreement about the perfect plan isn't easy to find, even among the experts. Experts at the [employer] developed an idea known as the Healthy Eating Plate. Just imagine a plate divided into logical, healthy portions.  The emphasis is on diet quality:  Load up on vegetables and fruits - one-half of your plate: Aim for color and variety, and remember that potatoes don't count.  Go for whole grains - one-quarter of your plate: Whole wheat, barley, wheat berries, quinoa, oats, brown rice, and foods made with them. If you want pasta, go with whole wheat pasta.  Protein power - one-quarter of your plate: Fish, chicken, beans, and nuts are all healthy, versatile protein sources. Limit red meat.  The diet, however, does go beyond the plate, offering a few other suggestions.  Use healthy plant oils, such as olive, canola,  soy, corn, sunflower and peanut. Check the labels, and avoid partially hydrogenated oil, which have unhealthy trans fats.  If you're thirsty, drink water. Coffee and tea are good in moderation, but skip sugary drinks and limit milk and dairy products to one or two daily servings.  The type of carbohydrate in the diet is more important than the amount. Some sources of carbohydrates, such as vegetables, fruits, whole grains, and beans-are healthier than others.  Finally, stay active.

## 2018-12-25 LAB — COMPLETE METABOLIC PANEL WITH GFR
AG Ratio: 1.5 (calc) (ref 1.0–2.5)
ALT: 63 U/L — ABNORMAL HIGH (ref 9–46)
AST: 45 U/L — ABNORMAL HIGH (ref 10–35)
Albumin: 4.4 g/dL (ref 3.6–5.1)
Alkaline phosphatase (APISO): 33 U/L — ABNORMAL LOW (ref 35–144)
BUN: 14 mg/dL (ref 7–25)
CO2: 25 mmol/L (ref 20–32)
Calcium: 9.8 mg/dL (ref 8.6–10.3)
Chloride: 101 mmol/L (ref 98–110)
Creat: 0.98 mg/dL (ref 0.70–1.18)
GFR, Est African American: 90 mL/min/{1.73_m2} (ref >=60)
GFR, Est Non African American: 77 mL/min/{1.73_m2} (ref >=60)
Globulin: 3 g/dL (calc) (ref 1.9–3.7)
Glucose, Bld: 105 mg/dL — ABNORMAL HIGH (ref 65–99)
Potassium: 4.1 mmol/L (ref 3.5–5.3)
Sodium: 137 mmol/L (ref 135–146)
Total Bilirubin: 1 mg/dL (ref 0.2–1.2)
Total Protein: 7.4 g/dL (ref 6.1–8.1)

## 2018-12-25 LAB — LIPID PANEL
Cholesterol: 212 mg/dL — ABNORMAL HIGH (ref <200)
HDL: 41 mg/dL (ref >=40)
LDL Cholesterol (Calc): 137 mg/dL (calc) — ABNORMAL HIGH
Non-HDL Cholesterol (Calc): 171 mg/dL (calc) — ABNORMAL HIGH (ref <130)
Total CHOL/HDL Ratio: 5.2 (calc) — ABNORMAL HIGH (ref <5.0)
Triglycerides: 198 mg/dL — ABNORMAL HIGH (ref <150)

## 2018-12-25 LAB — CBC WITH DIFFERENTIAL/PLATELET
Absolute Monocytes: 656 cells/uL (ref 200–950)
Basophils Absolute: 86 cells/uL (ref 0–200)
Basophils Relative: 0.9 %
Eosinophils Absolute: 114 cells/uL (ref 15–500)
Eosinophils Relative: 1.2 %
HCT: 47.7 % (ref 38.5–50.0)
Hemoglobin: 17.1 g/dL (ref 13.2–17.1)
Lymphs Abs: 3059 cells/uL (ref 850–3900)
MCH: 31.8 pg (ref 27.0–33.0)
MCHC: 35.8 g/dL (ref 32.0–36.0)
MCV: 88.7 fL (ref 80.0–100.0)
MPV: 10.8 fL (ref 7.5–12.5)
Monocytes Relative: 6.9 %
Neutro Abs: 5586 cells/uL (ref 1500–7800)
Neutrophils Relative %: 58.8 %
Platelets: 369 10*3/uL (ref 140–400)
RBC: 5.38 10*6/uL (ref 4.20–5.80)
RDW: 13.2 % (ref 11.0–15.0)
Total Lymphocyte: 32.2 %
WBC: 9.5 10*3/uL (ref 3.8–10.8)

## 2018-12-25 LAB — MAGNESIUM: Magnesium: 2.1 mg/dL (ref 1.5–2.5)

## 2018-12-25 LAB — HEMOGLOBIN A1C
Hgb A1c MFr Bld: 6.1 % of total Hgb — ABNORMAL HIGH (ref <5.7)
Mean Plasma Glucose: 128 (calc)
eAG (mmol/L): 7.1 (calc)

## 2018-12-25 LAB — TSH: TSH: 1.08 mIU/L (ref 0.40–4.50)

## 2019-01-19 ENCOUNTER — Other Ambulatory Visit: Payer: Self-pay | Admitting: Adult Health

## 2019-01-20 ENCOUNTER — Other Ambulatory Visit: Payer: Self-pay | Admitting: Internal Medicine

## 2019-01-20 DIAGNOSIS — R450 Nervousness: Secondary | ICD-10-CM

## 2019-01-20 DIAGNOSIS — E782 Mixed hyperlipidemia: Secondary | ICD-10-CM

## 2019-01-20 MED ORDER — ALPRAZOLAM 0.5 MG PO TABS: ORAL_TABLET | ORAL | 0 refills | Status: DC

## 2019-01-20 MED ORDER — FENOFIBRATE 160 MG PO TABS: ORAL_TABLET | ORAL | 3 refills | Status: DC

## 2019-02-21 ENCOUNTER — Other Ambulatory Visit: Payer: Self-pay | Admitting: Internal Medicine

## 2019-02-21 DIAGNOSIS — R450 Nervousness: Secondary | ICD-10-CM

## 2019-02-21 MED ORDER — ALPRAZOLAM 0.5 MG PO TABS: ORAL_TABLET | ORAL | 0 refills | Status: DC

## 2019-03-22 ENCOUNTER — Other Ambulatory Visit: Payer: Self-pay | Admitting: Internal Medicine

## 2019-03-22 DIAGNOSIS — R450 Nervousness: Secondary | ICD-10-CM

## 2019-03-30 ENCOUNTER — Ambulatory Visit: Payer: Medicare Other | Admitting: Internal Medicine

## 2019-04-12 NOTE — Progress Notes (Signed)
History of Present Illness:      This very nice 73 y.o. MWM presents for 6 month follow up with HTN, HLD, Pre-Diabetes and Vitamin D Deficiency.       Patient is treated for HTN (2013) & BP has been controlled at home. Today's BP was initially slightly elevated & rechecked at goal -134/86. Patient has had no complaints of any cardiac type chest pain, palpitations, dyspnea / orthopnea / PND, dizziness, claudication, or dependent edema.      Hyperlipidemia is controlled with diet & meds. Patient denies myalgias or other med SE's. Last Lipids were not at goal:  Lab Results  Component Value Date   CHOL 214 (H) 04/13/2019   HDL 35 (L) 04/13/2019   LDLCALC  not calculated 04/13/2019   TRIG 621 (H) 04/13/2019   CHOLHDL 6.1 (H) 04/13/2019        Also, the patient has history of T2_NIDDM (A1c 6.5% / 2012)  attempting control by diet.  He has had no symptoms of reactive hypoglycemia, diabetic polys, paresthesias or visual blurring.  Last A1c was not at goal:   Lab Results  Component Value Date   HGBA1C 6.8 (H) 04/13/2019       Further, the patient also has history of Vitamin D Deficiency and supplements vitamin D without any suspected side-effects. Last vitamin D was at goal:  Lab Results  Component Value Date   VD25OH 58 04/13/2019    Current Outpatient Medications on File Prior to Visit  Medication Sig  . ALPRAZolam (XANAX) 0.5 MG tablet Take 1/2-1 tablet 2 - 3 x /day ONLY if needed for Anxiety Attack &  limit to 5 days /week to avoid addiction \  . amLODipine (NORVASC) 5 MG tablet Take 1 tablet Daily for BP  . aspirin 81 MG tablet Take 81 mg by mouth daily.  . Calcium Carbonate (CALCIUM 600 PO) Take by mouth daily.  . cholecalciferol (VITAMIN D3) 25 MCG (1000 UT) tablet Take 1,000 Units by mouth daily.  . Clobetasol Prop Emollient Base 0.05 % emollient cream   . fenofibrate 160 MG tablet Take 1 tablet Daily for Triglycerides (Blood Fats)  . Magnesium 500 MG CAPS Take 500 mg  by mouth daily.  . Omega-3 Fatty Acids (FISH OIL) 1200 MG CAPS Take by mouth daily.   Marland Kitchen OVER THE COUNTER MEDICATION Vitamin C. 500 mg. One tablet daily.  . Risankizumab-rzaa,150 MG Dose, 75 MG/0.83ML PSKT Inject into the skin. Inject every 12 weeks   No current facility-administered medications on file prior to visit.     No Known Allergies  PMHx:   Past Medical History:  Diagnosis Date  . Arthritis   . Crohn's colitis ([place])   . Elevated LFTs   . Hx of colonic polyp   . Hyperlipidemia   . Hypertension   . Hypogonadism male   . Osteoporosis   . Psoriatic arthritis ([place])   . Renal glycosuria    Immunization History  Administered Date(s) Administered  . DT 03/17/2014  . PPD Test 07/19/2015, 01/22/2017  . Pneumococcal Conjugate-13 09/04/2016  . Pneumococcal Polysaccharide-23 12/10/2017  . Pneumococcal-Unspecified 06/04/2003   Past Surgical History:  Procedure Laterality Date  . COLONOSCOPY    . POLYPECTOMY    . TONSILLECTOMY     age 57-5     FHx:    Reviewed / unchanged  SHx:    Reviewed / unchanged   Systems Review:  Constitutional: Denies fever, chills, wt changes, headaches, insomnia,  fatigue, night sweats, change in appetite. Eyes: Denies redness, blurred vision, diplopia, discharge, itchy, watery eyes.  ENT: Denies discharge, congestion, post nasal drip, epistaxis, sore throat, earache, hearing loss, dental pain, tinnitus, vertigo, sinus pain, snoring.  CV: Denies chest pain, palpitations, irregular heartbeat, syncope, dyspnea, diaphoresis, orthopnea, PND, claudication or edema. Respiratory: denies cough, dyspnea, DOE, pleurisy, hoarseness, laryngitis, wheezing.  Gastrointestinal: Denies dysphagia, odynophagia, heartburn, reflux, water brash, abdominal pain or cramps, nausea, vomiting, bloating, diarrhea, constipation, hematemesis, melena, hematochezia  or hemorrhoids. Genitourinary: Denies dysuria, frequency, urgency, nocturia, hesitancy, discharge, hematuria or  flank pain. Musculoskeletal: Denies arthralgias, myalgias, stiffness, jt. swelling, pain, limping or strain/sprain.  Skin: Denies pruritus, rash, hives, warts, acne, eczema or change in skin lesion(s). Neuro: No weakness, tremor, incoordination, spasms, paresthesia or pain. Psychiatric: Denies confusion, memory loss or sensory loss. Endo: Denies change in weight, skin or hair change.  Heme/Lymph: No excessive bleeding, bruising or enlarged lymph nodes.  Physical Exam  BP 134/86   Pulse 64   Temp 97.7 F (36.5 C)   Resp 16   Ht 5' 9"  (1.753 m)   Wt 213 lb (96.6 kg)   BMI 31.45 kg/m   Appears  over nourished, well groomed  and in no distress.  Eyes: PERRLA, EOMs, conjunctiva no swelling or erythema. Sinuses: No frontal/maxillary tenderness ENT/Mouth: EAC's clear, TM's nl w/o erythema, bulging. Nares clear w/o erythema, swelling, exudates. Oropharynx clear without erythema or exudates. Oral hygiene is good. Tongue normal, non obstructing. Hearing intact.  Neck: Supple. Thyroid not palpable. Car 2+/2+ without bruits, nodes or JVD. Chest: Respirations nl with BS clear & equal w/o rales, rhonchi, wheezing or stridor.  Cor: Heart sounds normal w/ regular rate and rhythm without sig. murmurs, gallops, clicks or rubs. Peripheral pulses normal and equal  without edema.  Abdomen: Soft & bowel sounds normal. Non-tender w/o guarding, rebound, hernias, masses or organomegaly.  Lymphatics: Unremarkable.  Musculoskeletal: Full ROM all peripheral extremities, joint stability, 5/5 strength and normal gait.  Skin: Warm, dry without exposed rashes, lesions or ecchymosis apparent.  Neuro: Cranial nerves intact, reflexes equal bilaterally. Sensory-motor testing grossly intact. Tendon reflexes grossly intact.  Pysch: Alert & oriented x 3.  Insight and judgement nl & appropriate. No ideations.  Assessment and Plan:  1. Essential hypertension  - Continue medication, monitor blood pressure at home.  -  Continue DASH diet.  Reminder to go to the ER if any CP,  SOB, nausea, dizziness, severe HA, changes vision/speech.  - CBC with Diff - COMPLETE METABOLIC PANEL WITH GFR - Magnesium - TSH  2. Hyperlipidemia associated with type 2 diabetes mellitus ([place])  - Continue diet/meds, exercise,& lifestyle modifications.  - Continue monitor periodic cholesterol/liver & renal functions   - Lipid Profile - TSH  3. Type 2 diabetes mellitus with stage 2 chronic kidney disease,  without long-term current use of insulin (HCC)  - Continue diet, exercise  - Lifestyle modifications.  - Monitor appropriate labs.  - Hemoglobin A1c ([facility]) - Insulin, random  4. Vitamin D deficiency  - Continue supplementation.  - Vitamin D (25 hydroxy)  5. Medication management  - CBC with Diff - COMPLETE METABOLIC PANEL WITH GFR - Magnesium - Lipid Profile - TSH - Hemoglobin A1c ([facility]) - Insulin, random - Vitamin D (25 hydroxy)         Discussed  regular exercise, BP monitoring, weight control to achieve/maintain BMI less than 25 and discussed med and SE's. Recommended labs to assess and monitor clinical status with further  disposition pending results of labs.  I discussed the assessment and treatment plan with the patient. The patient was provided an opportunity to ask questions and all were answered. The patient agreed with the plan and demonstrated an understanding of the instructions.  I provided over 30 minutes of exam, counseling, chart review and  complex critical decision making.  Kirtland Bouchard, MD

## 2019-04-12 NOTE — Patient Instructions (Signed)

## 2019-04-13 ENCOUNTER — Other Ambulatory Visit: Payer: Self-pay

## 2019-04-13 ENCOUNTER — Ambulatory Visit (INDEPENDENT_AMBULATORY_CARE_PROVIDER_SITE_OTHER): Payer: Medicare Other | Admitting: Internal Medicine

## 2019-04-13 ENCOUNTER — Encounter: Payer: Self-pay | Admitting: Internal Medicine

## 2019-04-13 VITALS — BP 134/86 | HR 64 | Temp 97.7°F | Resp 16 | Ht 69.0 in | Wt 213.0 lb

## 2019-04-13 DIAGNOSIS — E782 Mixed hyperlipidemia: Secondary | ICD-10-CM

## 2019-04-13 DIAGNOSIS — E559 Vitamin D deficiency, unspecified: Secondary | ICD-10-CM | POA: Diagnosis not present

## 2019-04-13 DIAGNOSIS — E1169 Type 2 diabetes mellitus with other specified complication: Secondary | ICD-10-CM

## 2019-04-13 DIAGNOSIS — Z0000 Encounter for general adult medical examination without abnormal findings: Secondary | ICD-10-CM | POA: Diagnosis not present

## 2019-04-13 DIAGNOSIS — I10 Essential (primary) hypertension: Secondary | ICD-10-CM | POA: Diagnosis not present

## 2019-04-13 DIAGNOSIS — Z79899 Other long term (current) drug therapy: Secondary | ICD-10-CM

## 2019-04-13 DIAGNOSIS — E785 Hyperlipidemia, unspecified: Secondary | ICD-10-CM | POA: Diagnosis not present

## 2019-04-13 DIAGNOSIS — E1122 Type 2 diabetes mellitus with diabetic chronic kidney disease: Secondary | ICD-10-CM | POA: Diagnosis not present

## 2019-04-14 LAB — COMPLETE METABOLIC PANEL WITH GFR
AG Ratio: 1.6 (calc) (ref 1.0–2.5)
ALT: 51 U/L — ABNORMAL HIGH (ref 9–46)
AST: 40 U/L — ABNORMAL HIGH (ref 10–35)
Albumin: 4.4 g/dL (ref 3.6–5.1)
Alkaline phosphatase (APISO): 37 U/L (ref 35–144)
BUN: 20 mg/dL (ref 7–25)
CO2: 24 mmol/L (ref 20–32)
Calcium: 9.6 mg/dL (ref 8.6–10.3)
Chloride: 103 mmol/L (ref 98–110)
Creat: 0.88 mg/dL (ref 0.70–1.18)
GFR, Est African American: 100 mL/min/{1.73_m2} (ref >=60)
GFR, Est Non African American: 86 mL/min/{1.73_m2} (ref >=60)
Globulin: 2.7 g/dL (calc) (ref 1.9–3.7)
Glucose, Bld: 108 mg/dL — ABNORMAL HIGH (ref 65–99)
Potassium: 4 mmol/L (ref 3.5–5.3)
Sodium: 139 mmol/L (ref 135–146)
Total Bilirubin: 0.5 mg/dL (ref 0.2–1.2)
Total Protein: 7.1 g/dL (ref 6.1–8.1)

## 2019-04-14 LAB — LIPID PANEL
Cholesterol: 214 mg/dL — ABNORMAL HIGH (ref <200)
HDL: 35 mg/dL — ABNORMAL LOW (ref >=40)
Non-HDL Cholesterol (Calc): 179 mg/dL (calc) — ABNORMAL HIGH (ref <130)
Total CHOL/HDL Ratio: 6.1 (calc) — ABNORMAL HIGH (ref <5.0)
Triglycerides: 621 mg/dL — ABNORMAL HIGH (ref <150)

## 2019-04-14 LAB — CBC WITH DIFFERENTIAL/PLATELET
Absolute Monocytes: 593 cells/uL (ref 200–950)
Basophils Absolute: 87 cells/uL (ref 0–200)
Basophils Relative: 1.1 %
Eosinophils Absolute: 182 cells/uL (ref 15–500)
Eosinophils Relative: 2.3 %
HCT: 47.3 % (ref 38.5–50.0)
Hemoglobin: 17.1 g/dL (ref 13.2–17.1)
Lymphs Abs: 3128 cells/uL (ref 850–3900)
MCH: 32.9 pg (ref 27.0–33.0)
MCHC: 36.2 g/dL — ABNORMAL HIGH (ref 32.0–36.0)
MCV: 91 fL (ref 80.0–100.0)
MPV: 10.9 fL (ref 7.5–12.5)
Monocytes Relative: 7.5 %
Neutro Abs: 3911 cells/uL (ref 1500–7800)
Neutrophils Relative %: 49.5 %
Platelets: 331 10*3/uL (ref 140–400)
RBC: 5.2 10*6/uL (ref 4.20–5.80)
RDW: 13.3 % (ref 11.0–15.0)
Total Lymphocyte: 39.6 %
WBC: 7.9 10*3/uL (ref 3.8–10.8)

## 2019-04-14 LAB — VITAMIN D 25 HYDROXY (VIT D DEFICIENCY, FRACTURES): Vit D, 25-Hydroxy: 58 ng/mL (ref 30–100)

## 2019-04-14 LAB — TSH: TSH: 1.68 mIU/L (ref 0.40–4.50)

## 2019-04-14 LAB — HEMOGLOBIN A1C
Hgb A1c MFr Bld: 6.8 % of total Hgb — ABNORMAL HIGH (ref <5.7)
Mean Plasma Glucose: 148 (calc)
eAG (mmol/L): 8.2 (calc)

## 2019-04-14 LAB — INSULIN, RANDOM: Insulin: 22.6 u[IU]/mL — ABNORMAL HIGH

## 2019-04-14 LAB — MAGNESIUM: Magnesium: 2.3 mg/dL (ref 1.5–2.5)

## 2019-04-18 ENCOUNTER — Encounter: Payer: Self-pay | Admitting: Internal Medicine

## 2019-04-20 ENCOUNTER — Other Ambulatory Visit: Payer: Self-pay | Admitting: Internal Medicine

## 2019-04-20 MED ORDER — AMOXICILLIN-POT CLAVULANATE 875-125 MG PO TABS: ORAL_TABLET | ORAL | 3 refills | Status: DC

## 2019-05-03 ENCOUNTER — Encounter: Payer: Self-pay | Admitting: Gastroenterology

## 2019-05-16 ENCOUNTER — Other Ambulatory Visit: Payer: Self-pay | Admitting: Internal Medicine

## 2019-05-16 DIAGNOSIS — R450 Nervousness: Secondary | ICD-10-CM

## 2019-05-20 ENCOUNTER — Other Ambulatory Visit: Payer: Self-pay | Admitting: Internal Medicine

## 2019-05-20 DIAGNOSIS — F5101 Primary insomnia: Secondary | ICD-10-CM

## 2019-05-20 MED ORDER — TRAZODONE HCL 150 MG PO TABS: ORAL_TABLET | ORAL | 0 refills | Status: DC

## 2019-06-23 ENCOUNTER — Other Ambulatory Visit: Payer: Self-pay | Admitting: Internal Medicine

## 2019-06-23 DIAGNOSIS — R450 Nervousness: Secondary | ICD-10-CM

## 2019-06-23 MED ORDER — ALPRAZOLAM 0.5 MG PO TABS: ORAL_TABLET | ORAL | 0 refills | Status: DC

## 2019-07-17 ENCOUNTER — Other Ambulatory Visit: Payer: Self-pay | Admitting: Internal Medicine

## 2019-07-17 DIAGNOSIS — R450 Nervousness: Secondary | ICD-10-CM

## 2019-07-20 ENCOUNTER — Ambulatory Visit: Payer: Medicare Other | Admitting: Adult Health Nurse Practitioner

## 2019-07-27 ENCOUNTER — Other Ambulatory Visit: Payer: Self-pay

## 2019-07-27 ENCOUNTER — Encounter: Payer: Self-pay | Admitting: Adult Health Nurse Practitioner

## 2019-07-27 ENCOUNTER — Ambulatory Visit (INDEPENDENT_AMBULATORY_CARE_PROVIDER_SITE_OTHER): Payer: Medicare Other | Admitting: Adult Health Nurse Practitioner

## 2019-07-27 VITALS — BP 136/80 | HR 88 | Temp 97.7°F | Ht 69.0 in | Wt 212.0 lb

## 2019-07-27 DIAGNOSIS — R7309 Other abnormal glucose: Secondary | ICD-10-CM | POA: Diagnosis not present

## 2019-07-27 DIAGNOSIS — E559 Vitamin D deficiency, unspecified: Secondary | ICD-10-CM | POA: Diagnosis not present

## 2019-07-27 DIAGNOSIS — E1122 Type 2 diabetes mellitus with diabetic chronic kidney disease: Secondary | ICD-10-CM

## 2019-07-27 DIAGNOSIS — I10 Essential (primary) hypertension: Secondary | ICD-10-CM | POA: Diagnosis not present

## 2019-07-27 DIAGNOSIS — L4050 Arthropathic psoriasis, unspecified: Secondary | ICD-10-CM

## 2019-07-27 DIAGNOSIS — Z79899 Other long term (current) drug therapy: Secondary | ICD-10-CM

## 2019-07-27 DIAGNOSIS — N182 Chronic kidney disease, stage 2 (mild): Secondary | ICD-10-CM

## 2019-07-27 DIAGNOSIS — K5090 Crohn's disease, unspecified, without complications: Secondary | ICD-10-CM

## 2019-07-27 DIAGNOSIS — R6889 Other general symptoms and signs: Secondary | ICD-10-CM | POA: Diagnosis not present

## 2019-07-27 DIAGNOSIS — F5101 Primary insomnia: Secondary | ICD-10-CM

## 2019-07-27 DIAGNOSIS — Z0001 Encounter for general adult medical examination with abnormal findings: Secondary | ICD-10-CM

## 2019-07-27 DIAGNOSIS — Z860100 Personal history of colon polyps, unspecified: Secondary | ICD-10-CM

## 2019-07-27 DIAGNOSIS — Z6831 Body mass index (BMI) 31.0-31.9, adult: Secondary | ICD-10-CM

## 2019-07-27 DIAGNOSIS — E782 Mixed hyperlipidemia: Secondary | ICD-10-CM | POA: Diagnosis not present

## 2019-07-27 DIAGNOSIS — Z0000 Encounter for general adult medical examination without abnormal findings: Secondary | ICD-10-CM

## 2019-07-27 DIAGNOSIS — M810 Age-related osteoporosis without current pathological fracture: Secondary | ICD-10-CM

## 2019-07-27 NOTE — Patient Instructions (Addendum)
declined

## 2019-07-27 NOTE — Progress Notes (Signed)
MEDICARE ANNUAL WELLNESS VISIT AND FOLLOW UP Assessment:   Melvin Shelton was seen today for follow-up and medicare wellness.  Diagnoses and all orders for this visit:   Encounter for Medicare annual wellness exam Yearly  Essential hypertension Continue medication Monitor blood pressure at home; call if consistently over 130/80 Continue DASH diet.   Reminder to go to the ER if any CP, SOB, nausea, dizziness, severe HA, changes vision/speech, left arm numbness and tingling and jaw pain.  Mixed hyperlipidemia -     Lipid panel  Type 2 diabetes mellitus with stage 2 chronic kidney disease, without long-term current use of insulin (HCC) Controlled by diet Discussed dietary and exercise modifications -     Hemoglobin A1c -     Insulin, random  Psoriatic arthritis ([place]) Followed by Dr. Lyman Speller Risankizumab injections Q12 weeks Contact regarding timing with COVID vaccination? Follows with Dr Lyman Speller  Osteoporosis without current pathological fracture, unspecified osteoporosis type Continue Vit D and Ca, weight bearing exercises  Vitamin D deficiency At goal at recent check; continue to recommend supplementation for goal of 70-100 Defer vitamin D level  Hx of colonic polyp UTD on colonoscopy  BMI 31.0-231.9,adult Long discussion about weight loss, diet, and exercise Recommended diet heavy in fruits and veggies and low in animal meats, cheeses, and dairy products, appropriate calorie intake Discussed appropriate weight for height  Follow up at next visit  Primary insomnia  Discussed good sleep hygiene, decrease stimulation prior to sleep Increase day time activity Avoid caffeine in evenings Continue trazodone with benefit  Vitamin D deficiency Continue supplementation Taking Vitamin D 1,000IU daily   Crohn's disease without complication, unspecified gastrointestinal tract location [facility]) Doing well at this time Previous medication induced.  Has repeat colonoscopy scheduled,  May 2021.  Medication management Continued  Over 30 minutes of exam, counseling, chart review, and critical decision making was performed  Future Appointments  Date Time Provider [place]  11/23/2019  2:00 PM Unk Pinto, MD [facility] None     Plan:   During the course of the visit the patient was educated and counseled about appropriate screening and preventive services including:    Pneumococcal vaccine   Influenza vaccine  Prevnar 13  Td vaccine  Screening electrocardiogram  Colorectal cancer screening  Diabetes screening  Glaucoma screening  Nutrition counseling    Subjective:  Melvin Shelton is a 74 y.o. male who presents for Medicare Annual Wellness Visit and 3 month follow up for HTN, hyperlipidemia, prediabetes, and vitamin D Def.   Increased wheeing, increased albuterol use.  With activity. He has been taking care of his wife who is immobile. 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  He is taking Risankizumab injections Q12weeks. And follows with Dr Lyman Speller.   BMI is Body mass index is 31.31 kg/m., he has been working on diet and exercise. Wt Readings from Last 3 Encounters:  07/27/19 212 lb (96.2 kg)  04/13/19 213 lb (96.6 kg)  12/24/18 204 lb (92.5 kg)   His blood pressure has been controlled at home, today their BP is BP: 136/80  He does not workout, is on tour so it makes it difficult.  He denies chest pain, shortness of breath, dizziness.   He is on cholesterol medication and denies myalgias. His cholesterol is not at goal. The cholesterol last visit  was:   Lab Results  Component Value Date   CHOL 214 (H) 04/13/2019   HDL 35 (L) 04/13/2019   [place]  04/13/2019     Comment:     . LDL cholesterol not calculated. Triglyceride levels greater than  400 mg/dL invalidate calculated LDL results. . Reference range: <100 . Desirable range <100 mg/dL for primary prevention;   <70 mg/dL for patients with CHD or diabetic patients  with > or = 2 CHD risk factors. Marland Kitchen LDL-C is now calculated using the Martin-Hopkins  calculation, which is a validated novel method providing  better accuracy than the Friedewald equation in the  estimation of LDL-C.  Cresenciano Genre et al. Annamaria Helling. 2637;858(85): 2061-2068  (http://education.[employer].com/faq/FAQ164)    TRIG 621 (H) 04/13/2019   CHOLHDL 6.1 (H) 04/13/2019    He has been working on diet and exercise for DMII last check in prediabetes range and diet controlled, and denies foot ulcerations, increased appetite, nausea, paresthesia of the feet, polydipsia, polyuria, visual disturbances, vomiting and weight loss. Last A1C in the office was:  Lab Results  Component Value Date   HGBA1C 6.8 (H) 04/13/2019  He reports they have a new puppy and he has been out walking with the dog and has been working to increase his activity to help with weight loss.  Last GFR Lab Results  Component Value Date   GFRNONAA 86 04/13/2019    Patient is on Vitamin D supplement.   Lab Results  Component Value Date   VD25OH 58 04/13/2019       Medication Review: Current Outpatient Medications on File Prior to Visit  Medication Sig Dispense Refill  . ALPRAZolam (XANAX) 0.5 MG tablet Take 1/2 to 1 tablet     2 to 3 x / day  ONLY if needed for Panic Attack or Anxiety Attack & please try to limit to 5 days /week to avoid Addiction & Dementia 30 tablet 0  . amLODipine (NORVASC) 5 MG tablet Take 1 tablet Daily for BP 90 tablet 3  . amoxicillin-clavulanate (AUGMENTIN) 875-125 MG tablet Take 1 tablet 2 x /day with Food for Dental infection 20 tablet 3  . aspirin 81 MG tablet Take 81 mg by mouth daily.    . Calcium Carbonate (CALCIUM 600 PO) Take by mouth daily.    . cholecalciferol (VITAMIN D3) 25 MCG (1000 UT) tablet Take  1,000 Units by mouth daily.    . Clobetasol Prop Emollient Base 0.05 % emollient cream     . fenofibrate 160 MG tablet Take 1 tablet Daily for Triglycerides (Blood Fats) 90 tablet 3  . Magnesium 500 MG CAPS Take 500 mg by mouth daily.    . Omega-3 Fatty Acids (FISH OIL) 1200 MG CAPS Take by mouth daily.     Marland Kitchen OVER THE COUNTER MEDICATION Vitamin C. 500 mg. One tablet daily.    . Risankizumab-rzaa,150 MG Dose, 75 MG/0.83ML PSKT Inject into the skin. Inject every 12 weeks    . traZODone (DESYREL) 150 MG tablet Take 1/2 to 1 tablet 1 hour before Bedtime for sleep 90 tablet 0   No current facility-administered medications on file prior to visit.    Allergies: No Known Allergies  Current Problems (verified) has Hypertension; Psoriatic arthritis ([place]); Hyperlipidemia; Osteoporosis; Hx of colonic polyp; Medication management; Vitamin D deficiency; Encounter for Medicare annual wellness exam; Multiple atypical nevi; and Crohn disease ([place]) on their problem list.  Screening Tests Immunization History  Administered Date(s) Administered  . DT (Pediatric) 03/17/2014  .  PPD Test 06/13/2014, 07/19/2015, 07/15/2016, 01/22/2017  . Pneumococcal Conjugate-13 09/04/2016  . Pneumococcal Polysaccharide-23 12/10/2017  . Pneumococcal-Unspecified 06/04/2003    Preventative care: Last colonoscopy: 2019 (after positive cologard) - repeat 1 year- Dr. Loletha Carrow.  Potentially schedule for May DUE  Prior vaccinations: DT: 2015 Influenza: declines Pneumonia: 2018 Prevnar: 2018 Zoster: declines  Names of Other Physician/Practitioners you currently use: 1. [facility] here for primary care 2. Has not gotten checked lately, eye doctor, last visit 2016  3. Does not see, dentist, last visit 2016  Patient Care Team: Unk Pinto, MD as PCP - General (Internal Medicine) Inda Castle, MD (Inactive) as Consulting Physician (Gastroenterology) Orville Govern, MD as  Consulting Physician  Surgical: He  has a past surgical history that includes Colonoscopy; Polypectomy; and Tonsillectomy. Family His family history includes Diabetes in his father; Heart disease in his father; Hypertension in his father; Lymphoma in his mother; Prostate cancer in his father. Social history  He reports that he has never smoked. He has never used smokeless tobacco. He reports current alcohol use. He reports that he does not use drugs.  MEDICARE WELLNESS OBJECTIVES: Physical activity: Current Exercise Habits: Home exercise routine, Type of exercise: walking, Time (Minutes): 40, Frequency (Times/Week): 5, Weekly Exercise (Minutes/Week): 200, Intensity: Moderate Cardiac risk factors: Cardiac Risk Factors include: advanced age (>65mn, >>88women);dyslipidemia;hypertension;male gender;obesity (BMI >30kg/m2) Depression/mood screen:   [facility] 07/27/2019  Decreased Interest 0  Down, Depressed, Hopeless 0  PHQ - 2 Score 0    ADLs:  In your present state of health, do you have any difficulty performing the following activities: 07/27/2019 04/18/2019  Hearing? N N  Vision? N N  Difficulty concentrating or making decisions? N N  Walking or climbing stairs? N N  Dressing or bathing? N N  Doing errands, shopping? N N  Preparing Food and eating ? N -  Using the Toilet? N -  In the past six months, have you accidently leaked urine? N -  Do you have problems with loss of bowel control? N -  Managing your Medications? N -  Managing your Finances? N -  Housekeeping or managing your Housekeeping? N -  Some recent data might be hidden     Cognitive Testing  Alert? Yes  Normal Appearance?Yes  Oriented to person? Yes  Place? Yes   Time? Yes  Recall of three objects?  Yes  Can perform simple calculations? Yes  Displays appropriate judgment?Yes  Can read the correct time from a watch face?Yes  EOL planning: Does Patient Have a Medical Advance Directive?: No Would  patient like information on creating a medical advance directive?: No - Patient declined No- declines papers  Objective:   Today's Vitals   07/27/19 1513  BP: 136/80  Pulse: 88  Temp: 97.7 F (36.5 C)  SpO2: 97%  Weight: 212 lb (96.2 kg)  Height: 5' 9"  (1.753 m)  PainSc: 0-No pain   Body mass index is 31.31 kg/m.  Wt Readings from Last 3 Encounters:  07/27/19 212 lb (96.2 kg)  04/13/19 213 lb (96.6 kg)  12/24/18 204 lb (92.5 kg)    General appearance: alert, no distress, WD/WN, male HEENT: normocephalic, sclerae anicteric, TMs pearly, nares patent, no discharge or erythema, pharynx normal Oral cavity: MMM, no lesions Neck: supple, no lymphadenopathy, no thyromegaly, no masses Heart: RRR, normal S1, S2, no murmurs Lungs: CTA bilaterally, no wheezes, rhonchi, or rales Abdomen: +bs, obese soft, non tender, non distended,  no masses, no hepatomegaly, no splenomegaly Musculoskeletal: nontender, no swelling; obvious bony joint deformity/enlargement of DIP joints of bilateral hands and CMP joints without effusion or heat.  Extremities: no edema, no cyanosis, no clubbing Pulses: 2+ symmetric, upper and lower extremities, normal cap refill Neurological: alert, oriented x 3, CN2-12 intact, strength normal upper extremities and lower extremities, sensation normal throughout, DTRs 2+ throughout, no cerebellar signs, gait normal Psychiatric: normal affect, behavior normal, pleasant   Medicare Attestation I have personally reviewed: The patient's medical and social history Their use of alcohol, tobacco or illicit drugs Their current medications and supplements The patient's functional ability including ADLs,fall risks, home safety risks, cognitive, and hearing and visual impairment Diet and physical activities Evidence for depression or mood disorders  The patient's weight, height, BMI, and visual acuity have been recorded in the chart.  I have made referrals, counseling, and provided  education to the patient based on review of the above and I have provided the patient with a written personalized care plan for preventive services.     Garnet Sierras, NP   07/27/2019

## 2019-07-28 LAB — CBC WITH DIFFERENTIAL/PLATELET
Absolute Monocytes: 532 cells/uL (ref 200–950)
Basophils Absolute: 61 cells/uL (ref 0–200)
Basophils Relative: 0.8 %
Eosinophils Absolute: 137 cells/uL (ref 15–500)
Eosinophils Relative: 1.8 %
HCT: 50.2 % — ABNORMAL HIGH (ref 38.5–50.0)
Hemoglobin: 17.9 g/dL — ABNORMAL HIGH (ref 13.2–17.1)
Lymphs Abs: 2455 cells/uL (ref 850–3900)
MCH: 32 pg (ref 27.0–33.0)
MCHC: 35.7 g/dL (ref 32.0–36.0)
MCV: 89.8 fL (ref 80.0–100.0)
MPV: 10.9 fL (ref 7.5–12.5)
Monocytes Relative: 7 %
Neutro Abs: 4416 cells/uL (ref 1500–7800)
Neutrophils Relative %: 58.1 %
Platelets: 348 10*3/uL (ref 140–400)
RBC: 5.59 10*6/uL (ref 4.20–5.80)
RDW: 12.9 % (ref 11.0–15.0)
Total Lymphocyte: 32.3 %
WBC: 7.6 10*3/uL (ref 3.8–10.8)

## 2019-07-28 LAB — COMPLETE METABOLIC PANEL WITH GFR
AG Ratio: 1.3 (calc) (ref 1.0–2.5)
ALT: 82 U/L — ABNORMAL HIGH (ref 9–46)
AST: 45 U/L — ABNORMAL HIGH (ref 10–35)
Albumin: 4.4 g/dL (ref 3.6–5.1)
Alkaline phosphatase (APISO): 46 U/L (ref 35–144)
BUN: 15 mg/dL (ref 7–25)
CO2: 25 mmol/L (ref 20–32)
Calcium: 9.6 mg/dL (ref 8.6–10.3)
Chloride: 102 mmol/L (ref 98–110)
Creat: 0.95 mg/dL (ref 0.70–1.18)
GFR, Est African American: 93 mL/min/{1.73_m2} (ref >=60)
GFR, Est Non African American: 80 mL/min/{1.73_m2} (ref >=60)
Globulin: 3.3 g/dL (calc) (ref 1.9–3.7)
Glucose, Bld: 139 mg/dL — ABNORMAL HIGH (ref 65–99)
Potassium: 4.2 mmol/L (ref 3.5–5.3)
Sodium: 136 mmol/L (ref 135–146)
Total Bilirubin: 0.5 mg/dL (ref 0.2–1.2)
Total Protein: 7.7 g/dL (ref 6.1–8.1)

## 2019-07-28 LAB — HEMOGLOBIN A1C
Hgb A1c MFr Bld: 7.7 % of total Hgb — ABNORMAL HIGH (ref <5.7)
Mean Plasma Glucose: 174 (calc)
eAG (mmol/L): 9.7 (calc)

## 2019-07-28 LAB — LIPID PANEL
Cholesterol: 238 mg/dL — ABNORMAL HIGH (ref <200)
HDL: 44 mg/dL (ref >=40)
LDL Cholesterol (Calc): 140 mg/dL (calc) — ABNORMAL HIGH
Non-HDL Cholesterol (Calc): 194 mg/dL (calc) — ABNORMAL HIGH (ref <130)
Total CHOL/HDL Ratio: 5.4 (calc) — ABNORMAL HIGH (ref <5.0)
Triglycerides: 353 mg/dL — ABNORMAL HIGH (ref <150)

## 2019-07-28 LAB — INSULIN, RANDOM: Insulin: 29.6 u[IU]/mL — ABNORMAL HIGH

## 2019-07-30 ENCOUNTER — Other Ambulatory Visit: Payer: Self-pay | Admitting: Adult Health Nurse Practitioner

## 2019-07-30 DIAGNOSIS — E1165 Type 2 diabetes mellitus with hyperglycemia: Secondary | ICD-10-CM

## 2019-07-30 DIAGNOSIS — E782 Mixed hyperlipidemia: Secondary | ICD-10-CM

## 2019-08-06 MED ORDER — METFORMIN HCL 500 MG PO TABS: 500.0000 mg | ORAL_TABLET | Freq: Two times a day (BID) | ORAL | 3 refills | Status: DC

## 2019-08-06 MED ORDER — ROSUVASTATIN CALCIUM 20 MG PO TABS: 20.0000 mg | ORAL_TABLET | Freq: Every day | ORAL | 3 refills | Status: DC

## 2019-08-19 ENCOUNTER — Other Ambulatory Visit: Payer: Self-pay | Admitting: Internal Medicine

## 2019-08-19 DIAGNOSIS — F5101 Primary insomnia: Secondary | ICD-10-CM

## 2019-08-19 MED ORDER — TRAZODONE HCL 150 MG PO TABS: ORAL_TABLET | ORAL | 0 refills | Status: DC

## 2019-08-22 ENCOUNTER — Other Ambulatory Visit: Payer: Self-pay | Admitting: Internal Medicine

## 2019-08-22 DIAGNOSIS — R450 Nervousness: Secondary | ICD-10-CM

## 2019-09-29 ENCOUNTER — Other Ambulatory Visit: Payer: Self-pay | Admitting: Internal Medicine

## 2019-09-29 DIAGNOSIS — R450 Nervousness: Secondary | ICD-10-CM

## 2019-09-29 MED ORDER — ALPRAZOLAM 0.5 MG PO TABS: ORAL_TABLET | ORAL | 0 refills | Status: DC

## 2019-09-30 ENCOUNTER — Encounter: Payer: Medicare Other | Admitting: Internal Medicine

## 2019-10-30 ENCOUNTER — Other Ambulatory Visit: Payer: Self-pay | Admitting: Internal Medicine

## 2019-10-30 DIAGNOSIS — R450 Nervousness: Secondary | ICD-10-CM

## 2019-10-30 MED ORDER — ALPRAZOLAM 0.5 MG PO TABS: ORAL_TABLET | ORAL | 0 refills | Status: DC

## 2019-11-22 ENCOUNTER — Encounter: Payer: Self-pay | Admitting: Internal Medicine

## 2019-11-22 NOTE — Progress Notes (Signed)
Annual  Screening/Preventative Visit  & Comprehensive Evaluation & Examination     This very nice 74 y.o. MWM presents for a Screening / Preventative Visit & comprehensive evaluation and management of multiple medical co-morbidities.  Patient has been followed for HTN, HLD, T2_NIDDM  and Vitamin D Deficiency.     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  He is taking Risankizumab injections Q12weeks and follows with his dermatologist  - Dr Lyman Speller.      HTN predates circa 2013. Patient's BP has been controlled at home.  Today's BP is at goal -  132/84. Patient denies any cardiac symptoms as chest pain, palpitations, shortness of breath, dizziness or ankle swelling.     Patient's hyperlipidemia is not controlled with diet and Rosuvastatin / fenofibrate. Patient denies myalgias or other medication SE's. Last lipids were not at goal:  Lab Results  Component Value Date   CHOL 238 (H) 07/27/2019   HDL 44 07/27/2019   LDLCALC 140 (H) 07/27/2019   TRIG 353 (H) 07/27/2019   CHOLHDL 5.4 (H) 07/27/2019       Patient has hx/o T2_NIDDM  (A1c  6.5% / 2012) and was diet controlled until Feb 2021, his A1c was 7.7% and he was prescribed  Metformin- but he never started !  Patient denies reactive hypoglycemic symptoms, visual blurring, diabetic polys or paresthesias. Last A1c was not at goal:  Lab Results  Component Value Date   HGBA1C 7.7 (H) 07/27/2019        Finally, patient has history of Vitamin D Deficiency and last vitamin D was near goal:  Lab Results  Component Value Date   VD25OH 58 04/13/2019    Current Outpatient Medications on File Prior to Visit  Medication Sig   ALPRAZolam (XANAX) 0.5 MG tablet Take 1/2 - 1 tablet  at Bedtime  ONLY if needed for Sleep  &  limit to 5 days /week to avoid Addiction & Dementia   amLODipine  (NORVASC) 5 MG tablet Take 1 tablet Daily for BP   amoxicillin-clavulanate (AUGMENTIN) 875-125 MG tablet Take 1 tablet 2 x /day with Food for Dental infection   aspirin 81 MG tablet Take 81 mg by mouth daily.   Calcium Carbonate (CALCIUM 600 PO) Take by mouth daily.   cholecalciferol (VITAMIN D3) 25 MCG (1000 UT) tablet Take 1,000 Units by mouth daily.   Clobetasol Prop Emollient Base 0.05 % emollient cream    fenofibrate 160 MG tablet Take 1 tablet Daily for Triglycerides (Blood Fats)   Magnesium 500 MG CAPS Take 500 mg by mouth daily.   metFORMIN (GLUCOPHAGE) 500 MG tablet Take 1 tablet (500 mg total) by mouth 2 (two) times daily with a meal.   Omega-3 Fatty Acids (FISH OIL) 1200 MG CAPS Take by mouth daily.    OVER THE COUNTER MEDICATION Vitamin C. 500 mg. One tablet daily.   Risankizumab-rzaa,150 MG Dose, 75 MG/0.83ML PSKT Inject into the skin. Inject every 12 weeks   traZODone (DESYREL) 150 MG tablet Take 1/2 to 1 tablet 1 hour before Bedtime for sleep   rosuvastatin (CRESTOR) 20 MG tablet Take 1 tablet (20 mg total) by mouth at bedtime. (Patient not taking: Reported on 11/23/2019)   No current facility-administered medications on file prior to visit.   No Known Allergies   Past Medical History:  Diagnosis Date  Arthritis    Crohn's colitis ([place])    Elevated LFTs    Hx of colonic polyp    Hyperlipidemia    Hypertension    Hypogonadism male    Osteoporosis    Psoriatic arthritis ([place])    Renal glycosuria    Health Maintenance  Topic Date Due   COLONOSCOPY  05/21/2019   URINE MICROALBUMIN  09/23/2019   INFLUENZA VACCINE  01/02/2020   TETANUS/TDAP  03/17/2024   COVID-19 Vaccine  Completed   Hepatitis C Screening  Completed   PNA vac Low Risk Adult  Completed   Immunization History  Administered Date(s) Administered   DT (Pediatric) 03/17/2014   Moderna SARS-COVID-2 Vaccination 07/16/2019, 08/13/2019   PPD Test 06/13/2014, 07/19/2015,  07/15/2016, 01/22/2017   Pneumococcal Conjugate-13 09/04/2016   Pneumococcal Polysaccharide-23 12/10/2017   Pneumococcal-Unspecified 06/04/2003   Last Colon - 05/20/2018 - Dr Loletha Carrow - Crohn's / Ileitis - active - recc treat & f/u 1 year  Past Surgical History:  Procedure Laterality Date   COLONOSCOPY     POLYPECTOMY     TONSILLECTOMY     age 32-5    Family History  Problem Relation Age of Onset   Lymphoma Mother    Heart disease Father    Hypertension Father    Diabetes Father    Prostate cancer Father    Colon cancer Neg Hx    Esophageal cancer Neg Hx    Liver cancer Neg Hx    Pancreatic cancer Neg Hx    Rectal cancer Neg Hx    Stomach cancer Neg Hx    Colon polyps Neg Hx    Social History   Socioeconomic History   Marital status: Married    Spouse name: Not on file   Number of children: Not on file   Years of education: Not on file   Highest education level: Not on file  Occupational History   Not on file  Tobacco Use   Smoking status: Never Smoker   Smokeless tobacco: Never Used  Vaping Use   Vaping Use: Never used  Substance and Sexual Activity   Alcohol use: Yes    Comment: ocassional   Drug use: No   Sexual activity: Not on file     ROS Constitutional: Denies fever, chills, weight loss/gain, headaches, insomnia,  night sweats or change in appetite. Does c/o fatigue. Eyes: Denies redness, blurred vision, diplopia, discharge, itchy or watery eyes.  ENT: Denies discharge, congestion, post nasal drip, epistaxis, sore throat, earache, hearing loss, dental pain, Tinnitus, Vertigo, Sinus pain or snoring.  Cardio: Denies chest pain, palpitations, irregular heartbeat, syncope, dyspnea, diaphoresis, orthopnea, PND, claudication or edema Respiratory: denies cough, dyspnea, DOE, pleurisy, hoarseness, laryngitis or wheezing.  Gastrointestinal: Denies dysphagia, heartburn, reflux, water brash, pain, cramps, nausea, vomiting, bloating,  diarrhea, constipation, hematemesis, melena, hematochezia, jaundice or hemorrhoids Genitourinary: Denies dysuria, frequency, urgency, nocturia, hesitancy, discharge, hematuria or flank pain Musculoskeletal: Denies arthralgia, myalgia, stiffness, Jt. Swelling, pain, limp or strain/sprain. Denies Falls. Skin: Denies puritis, rash, hives, warts, acne, eczema or change in skin lesion Neuro: No weakness, tremor, incoordination, spasms, paresthesia or pain Psychiatric: Denies confusion, memory loss or sensory loss. Denies Depression. Endocrine: Denies change in weight, skin, hair change, nocturia, and paresthesia, diabetic polys, visual blurring or hyper / hypo glycemic episodes.  Heme/Lymph: No excessive bleeding, bruising or enlarged lymph nodes.  Physical Exam  BP 132/84    Pulse 72    Temp (!) 97.2 F (36.2 C)    Resp 16  Ht 5' 9"  (1.753 m)    Wt 208 lb 12.8 oz (94.7 kg)    BMI 30.83 kg/m   General Appearance: Well nourished and well groomed and in no apparent distress.  Eyes: PERRLA, EOMs, conjunctiva no swelling or erythema, normal fundi and vessels. Sinuses: No frontal/maxillary tenderness ENT/Mouth: EACs patent / TMs  nl. Nares clear without erythema, swelling, mucoid exudates. Oral hygiene is good. No erythema, swelling, or exudate. Tongue normal, non-obstructing. Tonsils not swollen or erythematous. Hearing normal.  Neck: Supple, thyroid not palpable. No bruits, nodes or JVD. Respiratory: Respiratory effort normal.  BS equal and clear bilateral without rales, rhonci, wheezing or stridor. Cardio: Heart sounds are normal with regular rate and rhythm and no murmurs, rubs or gallops. Peripheral pulses are normal and equal bilaterally without edema. No aortic or femoral bruits. Chest: symmetric with normal excursions and percussion.  Abdomen: Soft, with Nl bowel sounds. Nontender, no guarding, rebound, hernias, masses, or organomegaly.  Lymphatics: Non tender without lymphadenopathy.   Musculoskeletal: Full ROM all peripheral extremities, joint stability, 5/5 strength, and normal gait. Skin: Warm and dry without rashes, lesions, cyanosis, clubbing or  ecchymosis.  Neuro: Cranial nerves intact, reflexes equal bilaterally. Normal muscle tone, no cerebellar symptoms. Sensation intact.  Pysch: Alert and oriented X 3 with normal affect, insight and judgment appropriate.   Assessment and Plan  1. Annual Preventative/Screening Exam   2. Essential hypertension  - EKG 12-Lead - [place], RETROPERITNL ABD,  LTD - Urinalysis, Routine w reflex microscopic - Microalbumin / creatinine urine ratio - CBC with Differential/Platelet - COMPLETE METABOLIC PANEL WITH GFR - Magnesium - TSH  3. Hyperlipidemia associated with type 2 diabetes mellitus ([place])  - EKG 12-Lead - [place], RETROPERITNL ABD,  LTD - Lipid panel - TSH  4. Type 2 diabetes mellitus with stage 2 chronic kidney disease,  without long-term current use of insulin (HCC)  - EKG 12-Lead - [place], RETROPERITNL ABD,  LTD - Urinalysis, Routine w reflex microscopic - Microalbumin / creatinine urine ratio - HM DIABETES FOOT EXAM - LOW EXTREMITY NEUR EXAM DOCUM - Hemoglobin A1c - Insulin, random  5. Vitamin D deficiency  - VITAMIN D 25 Hydroxy  6. Crohn's disease  ([place])   7. Psoriatic arthritis ([place])  8. BPH with obstruction/lower urinary tract symptoms  - PSA  9. Screening for colorectal cancer  - POC Hemoccult Bld/Stl   10. Prostate cancer screening  - PSA  11. Screening for ischemic heart disease  - EKG 12-Lead  12. FHx: heart disease  - EKG 12-Lead - [place], RETROPERITNL ABD,  LTD  13. Former smoker  - EKG 12-Lead - [place], RETROPERITNL ABD,  LTD  14. Screening for AAA (aortic abdominal aneurysm)  - [place], RETROPERITNL ABD,  LTD  15. Medication management  - Urinalysis, Routine w reflex microscopic - Microalbumin / creatinine urine ratio - CBC with Differential/Platelet - COMPLETE METABOLIC PANEL WITH  GFR - Magnesium - Lipid panel - TSH - Hemoglobin A1c - Insulin, random - VITAMIN D 25 Hydroxy         Patient was counseled in prudent diet, weight control to achieve/maintain BMI less than 25, BP monitoring, regular exercise and medications as discussed.  Discussed med effects and SE's. Routine screening labs and tests as requested with regular follow-up as recommended. Over 40 minutes of exam, counseling, chart review and high complex critical decision making was performed   Kirtland Bouchard, MD

## 2019-11-22 NOTE — Patient Instructions (Signed)

## 2019-11-23 ENCOUNTER — Other Ambulatory Visit: Payer: Self-pay

## 2019-11-23 ENCOUNTER — Ambulatory Visit (INDEPENDENT_AMBULATORY_CARE_PROVIDER_SITE_OTHER): Payer: Medicare Other | Admitting: Internal Medicine

## 2019-11-23 VITALS — BP 132/84 | HR 72 | Temp 97.2°F | Resp 16 | Ht 69.0 in | Wt 208.8 lb

## 2019-11-23 DIAGNOSIS — E559 Vitamin D deficiency, unspecified: Secondary | ICD-10-CM

## 2019-11-23 DIAGNOSIS — E785 Hyperlipidemia, unspecified: Secondary | ICD-10-CM

## 2019-11-23 DIAGNOSIS — Z136 Encounter for screening for cardiovascular disorders: Secondary | ICD-10-CM | POA: Diagnosis not present

## 2019-11-23 DIAGNOSIS — Z87891 Personal history of nicotine dependence: Secondary | ICD-10-CM

## 2019-11-23 DIAGNOSIS — I10 Essential (primary) hypertension: Secondary | ICD-10-CM | POA: Diagnosis not present

## 2019-11-23 DIAGNOSIS — Z8249 Family history of ischemic heart disease and other diseases of the circulatory system: Secondary | ICD-10-CM

## 2019-11-23 DIAGNOSIS — E1169 Type 2 diabetes mellitus with other specified complication: Secondary | ICD-10-CM | POA: Diagnosis not present

## 2019-11-23 DIAGNOSIS — K5090 Crohn's disease, unspecified, without complications: Secondary | ICD-10-CM

## 2019-11-23 DIAGNOSIS — L4050 Arthropathic psoriasis, unspecified: Secondary | ICD-10-CM

## 2019-11-23 DIAGNOSIS — E1122 Type 2 diabetes mellitus with diabetic chronic kidney disease: Secondary | ICD-10-CM | POA: Diagnosis not present

## 2019-11-23 DIAGNOSIS — Z125 Encounter for screening for malignant neoplasm of prostate: Secondary | ICD-10-CM

## 2019-11-23 DIAGNOSIS — N138 Other obstructive and reflux uropathy: Secondary | ICD-10-CM

## 2019-11-23 DIAGNOSIS — Z1211 Encounter for screening for malignant neoplasm of colon: Secondary | ICD-10-CM

## 2019-11-23 DIAGNOSIS — Z0000 Encounter for general adult medical examination without abnormal findings: Secondary | ICD-10-CM | POA: Diagnosis not present

## 2019-11-23 DIAGNOSIS — Z79899 Other long term (current) drug therapy: Secondary | ICD-10-CM

## 2019-11-23 DIAGNOSIS — N401 Enlarged prostate with lower urinary tract symptoms: Secondary | ICD-10-CM

## 2019-11-23 DIAGNOSIS — Z0001 Encounter for general adult medical examination with abnormal findings: Secondary | ICD-10-CM

## 2019-11-24 ENCOUNTER — Other Ambulatory Visit: Payer: Self-pay | Admitting: Internal Medicine

## 2019-11-24 DIAGNOSIS — E782 Mixed hyperlipidemia: Secondary | ICD-10-CM

## 2019-11-24 DIAGNOSIS — E1165 Type 2 diabetes mellitus with hyperglycemia: Secondary | ICD-10-CM

## 2019-11-24 LAB — COMPLETE METABOLIC PANEL WITH GFR
AG Ratio: 1.4 (calc) (ref 1.0–2.5)
ALT: 98 U/L — ABNORMAL HIGH (ref 9–46)
AST: 94 U/L — ABNORMAL HIGH (ref 10–35)
Albumin: 4.4 g/dL (ref 3.6–5.1)
Alkaline phosphatase (APISO): 46 U/L (ref 35–144)
BUN: 11 mg/dL (ref 7–25)
CO2: 24 mmol/L (ref 20–32)
Calcium: 9.8 mg/dL (ref 8.6–10.3)
Chloride: 102 mmol/L (ref 98–110)
Creat: 1.07 mg/dL (ref 0.70–1.18)
GFR, Est African American: 80 mL/min/{1.73_m2} (ref >=60)
GFR, Est Non African American: 69 mL/min/{1.73_m2} (ref >=60)
Globulin: 3.2 g/dL (calc) (ref 1.9–3.7)
Glucose, Bld: 136 mg/dL — ABNORMAL HIGH (ref 65–99)
Potassium: 4.4 mmol/L (ref 3.5–5.3)
Sodium: 138 mmol/L (ref 135–146)
Total Bilirubin: 0.8 mg/dL (ref 0.2–1.2)
Total Protein: 7.6 g/dL (ref 6.1–8.1)

## 2019-11-24 LAB — CBC WITH DIFFERENTIAL/PLATELET
Absolute Monocytes: 540 cells/uL (ref 200–950)
Basophils Absolute: 58 cells/uL (ref 0–200)
Basophils Relative: 0.8 %
Eosinophils Absolute: 122 cells/uL (ref 15–500)
Eosinophils Relative: 1.7 %
HCT: 50.4 % — ABNORMAL HIGH (ref 38.5–50.0)
Hemoglobin: 17.9 g/dL — ABNORMAL HIGH (ref 13.2–17.1)
Lymphs Abs: 2376 cells/uL (ref 850–3900)
MCH: 31.9 pg (ref 27.0–33.0)
MCHC: 35.5 g/dL (ref 32.0–36.0)
MCV: 89.7 fL (ref 80.0–100.0)
MPV: 11.2 fL (ref 7.5–12.5)
Monocytes Relative: 7.5 %
Neutro Abs: 4104 cells/uL (ref 1500–7800)
Neutrophils Relative %: 57 %
Platelets: 303 10*3/uL (ref 140–400)
RBC: 5.62 10*6/uL (ref 4.20–5.80)
RDW: 13.4 % (ref 11.0–15.0)
Total Lymphocyte: 33 %
WBC: 7.2 10*3/uL (ref 3.8–10.8)

## 2019-11-24 LAB — LIPID PANEL
Cholesterol: 230 mg/dL — ABNORMAL HIGH (ref <200)
HDL: 46 mg/dL (ref >=40)
LDL Cholesterol (Calc): 145 mg/dL (calc) — ABNORMAL HIGH
Non-HDL Cholesterol (Calc): 184 mg/dL (calc) — ABNORMAL HIGH (ref <130)
Total CHOL/HDL Ratio: 5 (calc) — ABNORMAL HIGH (ref <5.0)
Triglycerides: 241 mg/dL — ABNORMAL HIGH (ref <150)

## 2019-11-24 LAB — URINALYSIS, ROUTINE W REFLEX MICROSCOPIC
Bacteria, UA: NONE SEEN /HPF
Bilirubin Urine: NEGATIVE
Hgb urine dipstick: NEGATIVE
Hyaline Cast: NONE SEEN /LPF
Ketones, ur: NEGATIVE
Nitrite: NEGATIVE
RBC / HPF: NONE SEEN /HPF (ref 0–2)
Specific Gravity, Urine: 1.023 (ref 1.001–1.03)
Squamous Epithelial / HPF: NONE SEEN /HPF (ref <=5)
pH: 6 (ref 5.0–8.0)

## 2019-11-24 LAB — PSA: PSA: 2.9 ng/mL (ref <=4.0)

## 2019-11-24 LAB — INSULIN, RANDOM: Insulin: 22.7 u[IU]/mL — ABNORMAL HIGH

## 2019-11-24 LAB — MICROALBUMIN / CREATININE URINE RATIO
Creatinine, Urine: 154 mg/dL (ref 20–320)
Microalb Creat Ratio: 135 mcg/mg creat — ABNORMAL HIGH (ref <30)
Microalb, Ur: 20.8 mg/dL

## 2019-11-24 LAB — VITAMIN D 25 HYDROXY (VIT D DEFICIENCY, FRACTURES): Vit D, 25-Hydroxy: 68 ng/mL (ref 30–100)

## 2019-11-24 LAB — MAGNESIUM: Magnesium: 2.3 mg/dL (ref 1.5–2.5)

## 2019-11-24 LAB — TSH: TSH: 1 mIU/L (ref 0.40–4.50)

## 2019-11-24 LAB — HEMOGLOBIN A1C
Hgb A1c MFr Bld: 7.4 % of total Hgb — ABNORMAL HIGH (ref <5.7)
Mean Plasma Glucose: 166 (calc)
eAG (mmol/L): 9.2 (calc)

## 2019-11-24 MED ORDER — ROSUVASTATIN CALCIUM 20 MG PO TABS: ORAL_TABLET | ORAL | 0 refills | Status: DC

## 2019-11-24 MED ORDER — METFORMIN HCL ER 500 MG PO TB24: ORAL_TABLET | ORAL | 1 refills | Status: DC

## 2019-11-24 NOTE — Progress Notes (Signed)
=============================================================  -    Urine suspicious for prostate infection   -  will send for  Culture to check for infection =============================================================  -  PSA -Normal  & OK  =============================================================  -  Total Chol = 230 - very elevated - (Ideal or Goal is less than 180)   - and   - Bad  / Dangerous LDL Chol = 145 - also very high - (Ideal or Goal is less than 70)   - So.... Please start taking the Crestor Rx    (Rx sent in again to your [facility]) =============================================================  -  Also Triglycerides (   241  ) or fats in blood are too high  (goal is less than 150)    - Recommend avoid fried & greasy foods,  sweets / candy,   - Avoid white rice  (brown or wild rice or Quinoa is OK),   - Avoid white potatoes  (sweet potatoes are OK)   - Avoid anything made from white flour  - bagels, doughnuts, rolls, buns, biscuits, white and   wheat breads, pizza crust and traditional  pasta made of white flour & egg white  - (vegetarian pasta or spinach or wheat pasta is OK).    - Multi-grain bread is OK - like multi-grain flat bread or  sandwich thins.   - Avoid alcohol in excess.   - Exercise is also important. =============================================================  -  A1c is elevated at 7.4% - Please start Metformin - (Rx sent in to your [facility]) =============================================================  -  Vitamin D = 68 - Excellent  =============================================================  -  All Else - CBC - Kidneys - Electrolytes -  Liver - Magnesium & Thyroid   - all  Normal / OK =============================================================

## 2019-11-25 ENCOUNTER — Other Ambulatory Visit: Payer: Self-pay | Admitting: Internal Medicine

## 2019-11-25 ENCOUNTER — Other Ambulatory Visit: Payer: Self-pay | Admitting: *Deleted

## 2019-11-25 DIAGNOSIS — N3000 Acute cystitis without hematuria: Secondary | ICD-10-CM

## 2019-11-26 LAB — URINE CULTURE
ISOLATE 1:: NO GROWTH
MICRO NUMBER:: 10631043
SPECIMEN QUALITY:: ADEQUATE

## 2019-11-27 ENCOUNTER — Other Ambulatory Visit: Payer: Self-pay | Admitting: Internal Medicine

## 2019-11-27 DIAGNOSIS — F5101 Primary insomnia: Secondary | ICD-10-CM

## 2019-11-27 MED ORDER — TRAZODONE HCL 150 MG PO TABS: ORAL_TABLET | ORAL | 0 refills | Status: DC

## 2019-11-27 NOTE — Progress Notes (Signed)
Urine Culture is OK -No Infection

## 2019-12-01 ENCOUNTER — Other Ambulatory Visit: Payer: Self-pay | Admitting: Internal Medicine

## 2019-12-01 DIAGNOSIS — R450 Nervousness: Secondary | ICD-10-CM

## 2019-12-01 MED ORDER — ALPRAZOLAM 0.5 MG PO TABS: ORAL_TABLET | ORAL | 0 refills | Status: DC

## 2020-01-04 ENCOUNTER — Other Ambulatory Visit: Payer: Self-pay | Admitting: Internal Medicine

## 2020-01-04 DIAGNOSIS — R450 Nervousness: Secondary | ICD-10-CM

## 2020-01-22 ENCOUNTER — Other Ambulatory Visit: Payer: Self-pay | Admitting: Internal Medicine

## 2020-01-22 DIAGNOSIS — E782 Mixed hyperlipidemia: Secondary | ICD-10-CM

## 2020-01-22 MED ORDER — FENOFIBRATE 160 MG PO TABS: ORAL_TABLET | ORAL | 0 refills | Status: DC

## 2020-02-02 ENCOUNTER — Other Ambulatory Visit: Payer: Self-pay | Admitting: Internal Medicine

## 2020-02-29 ENCOUNTER — Ambulatory Visit: Payer: Medicare Other | Admitting: Adult Health

## 2020-03-06 DIAGNOSIS — E663 Overweight: Secondary | ICD-10-CM | POA: Insufficient documentation

## 2020-03-06 DIAGNOSIS — E119 Type 2 diabetes mellitus without complications: Secondary | ICD-10-CM | POA: Insufficient documentation

## 2020-03-06 DIAGNOSIS — G4700 Insomnia, unspecified: Secondary | ICD-10-CM | POA: Insufficient documentation

## 2020-03-06 DIAGNOSIS — E1122 Type 2 diabetes mellitus with diabetic chronic kidney disease: Secondary | ICD-10-CM | POA: Insufficient documentation

## 2020-03-06 DIAGNOSIS — E669 Obesity, unspecified: Secondary | ICD-10-CM | POA: Insufficient documentation

## 2020-03-06 NOTE — Progress Notes (Signed)
3 MONTH FOLLOW UP Assessment:    Essential hypertension Continue medication; discussed benefit of transition to ACE/ARB, consider at future OV; defer for today per patient  Monitor blood pressure at home; call if consistently over 130/80 Continue DASH diet.   Reminder to go to the ER if any CP, SOB, nausea, dizziness, severe HA, changes vision/speech, left arm numbness and tingling and jaw pain.  Hyperlipidemia associated with T2DM (HCC) Continue medications - rosuvastatin, fenofibrate Discussed LDL goal <70, increase rosuvastatin if above goal, diet discussed Continue low cholesterol diet and exercise.  Check lipid panel.  -     Lipid panel  Type 2 diabetes mellitus with stage 2 chronic kidney disease, without long-term current use of insulin (HCC) Now on metformin 2000 mg daily and tolerating Discussed dietary and exercise modifications -     Hemoglobin A1c  CKD II associated with T2DM ([place]) Has been stable for many years;  Increase fluids, avoid NSAIDS, monitor sugars, will monitor  Psoriatic arthritis ([place]) Followed by Dr. Lyman Speller Risankizumab injections Q12 weeks  Vitamin D deficiency At goal at recent check; continue to recommend supplementation for goal of 60-100 Defer vitamin D level  Obesity (BMI 30+) Long discussion about weight loss, diet, and exercise Recommended diet heavy in fruits and veggies and low in animal meats, cheeses, and dairy products, appropriate calorie intake Discussed appropriate weight for height, goal of <190 lb set Increase fiber, add walking  Follow up at next visit  Primary insomnia   Discussed good sleep hygiene, decrease stimulation prior to sleep Increase day time activity, Avoid caffeine in evenings Continue trazodone with benefit, doing well limiting benzo    Crohn's disease without complication, unspecified gastrointestinal tract location [facility]) Doing well at this time, continue follow up Dr. Lyman Speller  Medication  management Continued  Over 30 minutes of exam, counseling, chart review, and critical decision making was performed  Future Appointments  Date Time Provider [place]  05/31/2020  2:30 PM Unk Pinto, MD [facility] None  08/30/2020 11:15 AM Liane Comber, NP [facility] None  11/22/2020  2:00 PM Unk Pinto, MD [facility] None     Subjective:  Melvin Shelton is a 74 y.o. male who presents for 3 month follow up for HTN, hyperlipidemia, T2DM with CKD II, and vitamin D Def.   [name] Patient has long standing Guttate Psoriasis / Psoriatic Arthritis in remission on Cosentyx, but 07/2017 had Colonoscopy by Dr Loletha Carrow with  Bx (+) Crohn's in the TI, so Cosentyx was discontinued. He is taking Risankizumab injections Q12 weeks. And follows with Dr Lyman Speller.   He has insomnia, taking trazodone, xanax 0.5 mg, takes 1-2 days per week.   BMI is Body mass index is 31.01 kg/m., he has been working on diet and exercise, trying to watch portions, but still admits could do better.  Planning to restart walking now that weather is improved, pushing water intake  Wt Readings from Last 3 Encounters:  03/08/20 210 lb (95.3 kg)  11/23/19 208 lb 12.8 oz (94.7 kg)  07/27/19 212 lb (96.2 kg)   He does not check BPs at home, today their BP is BP: 132/84  He does not workout. He denies chest pain, shortness of breath, dizziness.   He is on cholesterol medication (rosuvastatin 20 mg daily, fenofibrate 160 mg daily) and denies myalgias. His cholesterol is not at goal. The cholesterol last visit was:   Lab Results  Component Value Date   CHOL 230 (H) 11/23/2019   HDL  46 11/23/2019   LDLCALC 145 (H) 11/23/2019   TRIG 241 (H) 11/23/2019   CHOLHDL 5.0 (H) 11/23/2019    He has been working on diet and exercise for T2DM with CKD II, lifestyle controlled for several years until Feb 2021 7+ and started on metformin 500 mg BID, recently increased to 1000 mg BID and tolerating  well, and denies increased appetite, nausea, paresthesia of the feet, polydipsia, polyuria, visual disturbances and vomiting. Doesn't have glucometer, prefers to not start checking if possible.  Last A1C in the office was:  Lab Results  Component Value Date   HGBA1C 7.4 (H) 11/23/2019    He has CKD II associated with T2DM monitored at this office  Lab Results  Component Value Date   [facility] 69 11/23/2019   Patient is on Vitamin D supplement.   Lab Results  Component Value Date   VD25OH 68 11/23/2019        Medication Review: Current Outpatient Medications on File Prior to Visit  Medication Sig Dispense Refill   ALPRAZolam (XANAX) 0.5 MG tablet TAKE 1/2 TO 1 TABLET AT BEDTIME ONLY IF NEEDED FOR SLEEP AND LIMIT TO 5 DAYS PER WEEK TO AVOID ADDICTION AND DEMENTIA 30 tablet 0   amLODipine (NORVASC) 5 MG tablet Take 1 tablet    Daily     for BP 90 tablet 0   aspirin 81 MG tablet Take 81 mg by mouth daily.     Calcium Carbonate (CALCIUM 600 PO) Take by mouth daily.     cholecalciferol (VITAMIN D3) 25 MCG (1000 UT) tablet Take 1,000 Units by mouth daily.     fenofibrate 160 MG tablet Take 1 tablet Daily for Triglycerides (Blood Fats) 90 tablet 0   Magnesium 500 MG CAPS Take 500 mg by mouth daily.     metFORMIN (GLUCOPHAGE XR) 500 MG 24 hr tablet Take 2 tablets 2 x /day with Meals for Diabetes 360 tablet 1   Omega-3 Fatty Acids (FISH OIL) 1200 MG CAPS Take by mouth daily.      OVER THE COUNTER MEDICATION Vitamin C. 500 mg. One tablet daily.     Risankizumab-rzaa,150 MG Dose, 75 MG/0.83ML PSKT Inject into the skin. Inject every 12 weeks     rosuvastatin (CRESTOR) 20 MG tablet Take 1 tablet Daily for Cholesterol 90 tablet 0   traZODone (DESYREL) 150 MG tablet Take 1/2 to 1 tablet 1 hour before Bedtime for sleep 90 tablet 0   No current facility-administered medications on file prior to visit.    Allergies: No Known Allergies  Current Problems (verified) has Hypertension;  Psoriatic arthritis ([place]); Hyperlipidemia associated with type 2 diabetes mellitus ([place]); Osteoporosis; Hx of colonic polyp; Medication management; Vitamin D deficiency; Encounter for Medicare annual wellness exam; Multiple atypical nevi; Crohn disease ([place]); Obesity (BMI 30.0-34.9); T2DM (type 2 diabetes mellitus) ([place]); CKD stage 2 due to type 2 diabetes mellitus ([place]); and Insomnia on their problem list.   Surgical: He  has a past surgical history that includes Colonoscopy; Polypectomy; and Tonsillectomy. Family His family history includes Diabetes in his father; Heart disease in his father; Hypertension in his father; Lymphoma in his mother; Prostate cancer in his father. Social history  He reports that he has never smoked. He has never used smokeless tobacco. He reports current alcohol use. He reports that he does not use drugs.   Review of Systems  Constitutional: Negative for malaise/fatigue and weight loss.  HENT: Negative for hearing loss and tinnitus.   Eyes: Negative for  blurred vision and double vision.  Respiratory: Negative for cough, shortness of breath and wheezing.   Cardiovascular: Negative for chest pain, palpitations, orthopnea, claudication and leg swelling.  Gastrointestinal: Negative for abdominal pain, blood in stool, constipation, diarrhea, heartburn, melena, nausea and vomiting.  Genitourinary: Negative.   Musculoskeletal: Negative for joint pain and myalgias.  Skin: Negative for rash.  Neurological: Negative for dizziness, tingling, sensory change, weakness and headaches.  Endo/Heme/Allergies: Negative for polydipsia.  Psychiatric/Behavioral: Negative.   All other systems reviewed and are negative.     Objective:   Today's Vitals   03/08/20 1600 03/08/20 1648  BP: (!) 142/78 132/84  Pulse: 77   Temp: (!) 97.5 F (36.4 C)   SpO2: 97%   Weight: 210 lb (95.3 kg)   Height: 5' 9"  (1.753 m)    Body mass index is 31.01 kg/m.  Wt Readings from Last 3  Encounters:  03/08/20 210 lb (95.3 kg)  11/23/19 208 lb 12.8 oz (94.7 kg)  07/27/19 212 lb (96.2 kg)    General appearance: alert, no distress, WD/WN, male HEENT: normocephalic, sclerae anicteric, TMs pearly, nares patent, no discharge or erythema, pharynx normal Oral cavity: MMM, no lesions Neck: supple, no lymphadenopathy, no thyromegaly, no masses Heart: RRR, normal S1, S2, no murmurs Lungs: CTA bilaterally, no wheezes, rhonchi, or rales Abdomen: +bs, obese soft, non tender, non distended, no masses, no hepatomegaly, no splenomegaly Musculoskeletal: nontender, no swelling; obvious bony joint deformity/enlargement of DIP joints of bilateral hands and CMP joints without effusion or heat.  Extremities: no edema, no cyanosis, no clubbing Pulses: 2+ symmetric, upper and lower extremities, normal cap refill Neurological: alert, oriented x 3, CN2-12 intact, strength normal upper extremities and lower extremities, sensation normal throughout, DTRs 2+ throughout, no cerebellar signs, gait normal Psychiatric: normal affect, behavior normal, pleasant     Izora Ribas, NP   07/27/2019

## 2020-03-08 ENCOUNTER — Encounter: Payer: Self-pay | Admitting: Adult Health

## 2020-03-08 ENCOUNTER — Ambulatory Visit (INDEPENDENT_AMBULATORY_CARE_PROVIDER_SITE_OTHER): Payer: Medicare Other | Admitting: Adult Health

## 2020-03-08 ENCOUNTER — Other Ambulatory Visit: Payer: Self-pay

## 2020-03-08 VITALS — BP 132/84 | HR 77 | Temp 97.5°F | Ht 69.0 in | Wt 210.0 lb

## 2020-03-08 DIAGNOSIS — L4050 Arthropathic psoriasis, unspecified: Secondary | ICD-10-CM

## 2020-03-08 DIAGNOSIS — I10 Essential (primary) hypertension: Secondary | ICD-10-CM | POA: Diagnosis not present

## 2020-03-08 DIAGNOSIS — E785 Hyperlipidemia, unspecified: Secondary | ICD-10-CM

## 2020-03-08 DIAGNOSIS — E1169 Type 2 diabetes mellitus with other specified complication: Secondary | ICD-10-CM | POA: Diagnosis not present

## 2020-03-08 DIAGNOSIS — K5090 Crohn's disease, unspecified, without complications: Secondary | ICD-10-CM | POA: Diagnosis not present

## 2020-03-08 DIAGNOSIS — E1122 Type 2 diabetes mellitus with diabetic chronic kidney disease: Secondary | ICD-10-CM | POA: Diagnosis not present

## 2020-03-08 DIAGNOSIS — D649 Anemia, unspecified: Secondary | ICD-10-CM | POA: Diagnosis not present

## 2020-03-08 DIAGNOSIS — G4700 Insomnia, unspecified: Secondary | ICD-10-CM

## 2020-03-08 DIAGNOSIS — Z79899 Other long term (current) drug therapy: Secondary | ICD-10-CM | POA: Diagnosis not present

## 2020-03-08 DIAGNOSIS — R7989 Other specified abnormal findings of blood chemistry: Secondary | ICD-10-CM | POA: Diagnosis not present

## 2020-03-08 DIAGNOSIS — E669 Obesity, unspecified: Secondary | ICD-10-CM

## 2020-03-08 DIAGNOSIS — E559 Vitamin D deficiency, unspecified: Secondary | ICD-10-CM

## 2020-03-08 DIAGNOSIS — N182 Chronic kidney disease, stage 2 (mild): Secondary | ICD-10-CM

## 2020-03-08 NOTE — Patient Instructions (Addendum)
Goals    . Exercise 150 min/wk Moderate Activity    . Weight (lb) < 190 lb (86.2 kg)       Recommend 15-20 min of walking after meals - helps with insulin sensitivity  High fiber diet is good for diabetes, cholesterol and weight loss      High-Fiber Diet Fiber, also called dietary fiber, is a type of carbohydrate that is found in fruits, vegetables, whole grains, and beans. A high-fiber diet can have many health benefits. Your health care provider may recommend a high-fiber diet to help:  Prevent constipation. Fiber can make your bowel movements more regular.  Lower your cholesterol.  Relieve the following conditions: ? Swelling of veins in the anus (hemorrhoids). ? Swelling and irritation (inflammation) of specific areas of the digestive tract (uncomplicated diverticulosis). ? A problem of the large intestine (colon) that sometimes causes pain and diarrhea (irritable bowel syndrome, IBS).  Prevent overeating as part of a weight-loss plan.  Prevent heart disease, type 2 diabetes, and certain cancers. What is my plan? The recommended daily fiber intake in grams (g) includes:  38 g for men age 72 or younger.  30 g for men over age 25.  57 g for women age 72 or younger.  21 g for women over age 72. You can get the recommended daily intake of dietary fiber by:  Eating a variety of fruits, vegetables, grains, and beans.  Taking a fiber supplement, if it is not possible to get enough fiber through your diet. What do I need to know about a high-fiber diet?  It is better to get fiber through food sources rather than from fiber supplements. There is not a lot of research about how effective supplements are.  Always check the fiber content on the nutrition facts label of any prepackaged food. Look for foods that contain 5 g of fiber or more per serving.  Talk with a diet and nutrition specialist (dietitian) if you have questions about specific foods that are recommended or not  recommended for your medical condition, especially if those foods are not listed below.  Gradually increase how much fiber you consume. If you increase your intake of dietary fiber too quickly, you may have bloating, cramping, or gas.  Drink plenty of water. Water helps you to digest fiber. What are tips for following this plan?  Eat a wide variety of high-fiber foods.  Make sure that half of the grains that you eat each day are whole grains.  Eat breads and cereals that are made with whole-grain flour instead of refined flour or white flour.  Eat brown rice, bulgur wheat, or millet instead of white rice.  Start the day with a breakfast that is high in fiber, such as a cereal that contains 5 g of fiber or more per serving.  Use beans in place of meat in soups, salads, and pasta dishes.  Eat high-fiber snacks, such as berries, raw vegetables, nuts, and popcorn.  Choose whole fruits and vegetables instead of processed forms like juice or sauce. What foods can I eat?  Fruits Berries. Pears. Apples. Oranges. Avocado. Prunes and raisins. Dried figs. Vegetables Sweet potatoes. Spinach. Kale. Artichokes. Cabbage. Broccoli. Cauliflower. Green peas. Carrots. Squash. Grains Whole-grain breads. Multigrain cereal. Oats and oatmeal. Brown rice. Barley. Bulgur wheat. [place]. Quinoa. Bran muffins. Popcorn. Rye wafer crackers. Meats and other proteins Navy, kidney, and pinto beans. Soybeans. Split peas. Lentils. Nuts and seeds. Dairy Fiber-fortified yogurt. Beverages Fiber-fortified soy milk. Fiber-fortified orange juice.  Other foods Fiber bars. The items listed above may not be a complete list of recommended foods and beverages. Contact a dietitian for more options. What foods are not recommended? Fruits Fruit juice. Cooked, strained fruit. Vegetables Fried potatoes. Canned vegetables. Well-cooked vegetables. Grains White bread. Pasta made with refined flour. White rice. Meats and  other proteins Fatty cuts of meat. Fried chicken or fried fish. Dairy Milk. Yogurt. Cream cheese. Sour cream. Fats and oils Butters. Beverages Soft drinks. Other foods Cakes and pastries. The items listed above may not be a complete list of foods and beverages to avoid. Contact a dietitian for more information. Summary  Fiber is a type of carbohydrate. It is found in fruits, vegetables, whole grains, and beans.  There are many health benefits of eating a high-fiber diet, such as preventing constipation, lowering blood cholesterol, helping with weight loss, and reducing your risk of heart disease, diabetes, and certain cancers.  Gradually increase your intake of fiber. Increasing too fast can result in cramping, bloating, and gas. Drink plenty of water while you increase your fiber.  The best sources of fiber include whole fruits and vegetables, whole grains, nuts, seeds, and beans. This information is not intended to replace advice given to you by your health care provider. Make sure you discuss any questions you have with your health care provider. Document Revised: 03/24/2017 Document Reviewed: 03/24/2017 Elsevier Patient Education  2020 [employer].

## 2020-03-09 ENCOUNTER — Encounter: Payer: Self-pay | Admitting: Internal Medicine

## 2020-03-09 ENCOUNTER — Other Ambulatory Visit: Payer: Self-pay | Admitting: Adult Health

## 2020-03-09 ENCOUNTER — Encounter: Payer: Self-pay | Admitting: Adult Health

## 2020-03-09 DIAGNOSIS — R7989 Other specified abnormal findings of blood chemistry: Secondary | ICD-10-CM

## 2020-03-10 LAB — MAGNESIUM: Magnesium: 2 mg/dL (ref 1.5–2.5)

## 2020-03-10 LAB — COMPLETE METABOLIC PANEL WITH GFR
AG Ratio: 1.5 (calc) (ref 1.0–2.5)
ALT: 54 U/L — ABNORMAL HIGH (ref 9–46)
AST: 41 U/L — ABNORMAL HIGH (ref 10–35)
Albumin: 4.3 g/dL (ref 3.6–5.1)
Alkaline phosphatase (APISO): 42 U/L (ref 35–144)
BUN: 14 mg/dL (ref 7–25)
CO2: 26 mmol/L (ref 20–32)
Calcium: 9.3 mg/dL (ref 8.6–10.3)
Chloride: 102 mmol/L (ref 98–110)
Creat: 0.95 mg/dL (ref 0.70–1.18)
GFR, Est African American: 92 mL/min/{1.73_m2} (ref >=60)
GFR, Est Non African American: 80 mL/min/{1.73_m2} (ref >=60)
Globulin: 2.8 g/dL (calc) (ref 1.9–3.7)
Glucose, Bld: 192 mg/dL — ABNORMAL HIGH (ref 65–99)
Potassium: 3.9 mmol/L (ref 3.5–5.3)
Sodium: 135 mmol/L (ref 135–146)
Total Bilirubin: 0.7 mg/dL (ref 0.2–1.2)
Total Protein: 7.1 g/dL (ref 6.1–8.1)

## 2020-03-10 LAB — LIPID PANEL
Cholesterol: 121 mg/dL (ref <200)
HDL: 41 mg/dL (ref >=40)
LDL Cholesterol (Calc): 52 mg/dL (calc)
Non-HDL Cholesterol (Calc): 80 mg/dL (calc) (ref <130)
Total CHOL/HDL Ratio: 3 (calc) (ref <5.0)
Triglycerides: 226 mg/dL — ABNORMAL HIGH (ref <150)

## 2020-03-10 LAB — IRON: Iron: 91 ug/dL (ref 50–180)

## 2020-03-10 LAB — CBC WITH DIFFERENTIAL/PLATELET
Absolute Monocytes: 587 cells/uL (ref 200–950)
Basophils Absolute: 60 cells/uL (ref 0–200)
Basophils Relative: 0.7 %
Eosinophils Absolute: 153 cells/uL (ref 15–500)
Eosinophils Relative: 1.8 %
HCT: 45.4 % (ref 38.5–50.0)
Hemoglobin: 16.2 g/dL (ref 13.2–17.1)
Lymphs Abs: 2567 cells/uL (ref 850–3900)
MCH: 32.8 pg (ref 27.0–33.0)
MCHC: 35.7 g/dL (ref 32.0–36.0)
MCV: 91.9 fL (ref 80.0–100.0)
MPV: 10.9 fL (ref 7.5–12.5)
Monocytes Relative: 6.9 %
Neutro Abs: 5134 cells/uL (ref 1500–7800)
Neutrophils Relative %: 60.4 %
Platelets: 300 10*3/uL (ref 140–400)
RBC: 4.94 10*6/uL (ref 4.20–5.80)
RDW: 12.8 % (ref 11.0–15.0)
Total Lymphocyte: 30.2 %
WBC: 8.5 10*3/uL (ref 3.8–10.8)

## 2020-03-10 LAB — HEMOGLOBIN A1C
Hgb A1c MFr Bld: 7.3 % of total Hgb — ABNORMAL HIGH (ref <5.7)
Mean Plasma Glucose: 163 (calc)
eAG (mmol/L): 9 (calc)

## 2020-03-10 LAB — TEST AUTHORIZATION

## 2020-03-10 LAB — TSH: TSH: 0.86 mIU/L (ref 0.40–4.50)

## 2020-03-10 LAB — FERRITIN: Ferritin: 135 ng/mL (ref 24–380)

## 2020-03-18 ENCOUNTER — Other Ambulatory Visit: Payer: Self-pay | Admitting: Internal Medicine

## 2020-04-25 ENCOUNTER — Other Ambulatory Visit: Payer: Self-pay | Admitting: Internal Medicine

## 2020-04-25 DIAGNOSIS — R450 Nervousness: Secondary | ICD-10-CM

## 2020-04-28 ENCOUNTER — Other Ambulatory Visit: Payer: Self-pay | Admitting: Internal Medicine

## 2020-04-28 DIAGNOSIS — E782 Mixed hyperlipidemia: Secondary | ICD-10-CM

## 2020-04-30 ENCOUNTER — Other Ambulatory Visit: Payer: Self-pay | Admitting: Internal Medicine

## 2020-05-08 DIAGNOSIS — L409 Psoriasis, unspecified: Secondary | ICD-10-CM | POA: Diagnosis not present

## 2020-05-08 DIAGNOSIS — Z79899 Other long term (current) drug therapy: Secondary | ICD-10-CM | POA: Diagnosis not present

## 2020-05-08 DIAGNOSIS — L4050 Arthropathic psoriasis, unspecified: Secondary | ICD-10-CM | POA: Diagnosis not present

## 2020-05-08 DIAGNOSIS — Z5181 Encounter for therapeutic drug level monitoring: Secondary | ICD-10-CM | POA: Diagnosis not present

## 2020-05-30 ENCOUNTER — Other Ambulatory Visit: Payer: Self-pay | Admitting: Internal Medicine

## 2020-05-30 DIAGNOSIS — R450 Nervousness: Secondary | ICD-10-CM

## 2020-05-31 ENCOUNTER — Other Ambulatory Visit: Payer: Self-pay | Admitting: Internal Medicine

## 2020-05-31 ENCOUNTER — Ambulatory Visit: Payer: Medicare Other | Admitting: Internal Medicine

## 2020-05-31 DIAGNOSIS — F5101 Primary insomnia: Secondary | ICD-10-CM

## 2020-05-31 MED ORDER — TRAZODONE HCL 150 MG PO TABS: ORAL_TABLET | ORAL | 0 refills | Status: DC

## 2020-06-11 ENCOUNTER — Encounter: Payer: Self-pay | Admitting: Internal Medicine

## 2020-06-11 NOTE — Patient Instructions (Signed)

## 2020-06-11 NOTE — Progress Notes (Signed)
Patient ID: Melvin Shelton, male   DOB: 1945/09/18, 75 y.o.   MRN: [id number]       History of Present Illness:       This very nice 75 y.o. MWM presents for 6 month follow up with HTN, HLD, T2_NIDDM, Psoriatic Arthritis and Vitamin D Deficiency.    [[copied: Patient has long standing [place] Psoriasis &Psoriatic Arthritis in remission on Cosentyx, but 07/2017, he  had Colonoscopy (Dr Loletha Carrow)  with Bx (+) Crohn's in the TI, so Cosentyx was discontinued. He is taking Risankizumab injections Q12weeks and follows withhis dermatologist  - Dr Feldman.]]        Patient is treated for HTN (2013) & BP has been controlled at home. Today's BP is at goal -  116/82. Patient has had no complaints of any cardiac type chest pain, palpitations, dyspnea / orthopnea / PND, dizziness, claudication, or dependent edema.      Hyperlipidemia is controlled with diet & Rosuvastatin. Patient denies myalgias or other med SE's. 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  Patient has had no symptoms of reactive hypoglycemia, diabetic polys, paresthesias or visual blurring.  Last A1c was not at goal:  Lab Results  Component Value Date   HGBA1C 7.3 (H) 03/08/2020           Further, the patient also has history of Vitamin D Deficiency and supplements vitamin D without any suspected side-effects. Last vitamin D was at goal:  Lab Results  Component Value Date   VD25OH 68 11/23/2019    Current Outpatient Medications on File Prior to Visit  Medication Sig  . ALPRAZolam 0.5 MG tablet Take       1/2 - 1 tablet       2 - 3 x /day       ONLY      if needed   . amLODipine 5 MG tablet Take      1 tablet        Daily       for BP  . amoxicillin-clavulanate  875-125 MG tablet Take      1 tablet     2 x /day      with Food for Dental Infection  . aspirin 81 MG tablet Take daily.  . Calcium 600  Take daily.  Marland Kitchen VITAMIN D 1000 U Take 1,000 Units daily.  . fenofibrate 160 MG tablet Take     1 tablet       Daily       for Triglycerides (Blood Fats)  . Magnesium 500 MG CAPS Take 500 mg by mouth daily.  . metFORMIN-XR 500 MG  Take 2 tablets 2 x /day with Meals for Diabetes  . Omega-3 FISH OIL 1200 MG CAPS Take by mouth daily.   . Vitamin C. 500 mg.  One tablet daily.  . Risankizumab-rzaa,150 MG Dose Inject into the skin. Inject every 12 weeks  . rosuvastatin  20 MG tablet Take 1 tablet Daily for Cholesterol  . traZODone 150 MG tablet Take      1/2 to 1 tablet        1 hour  before Bedtime for sleep  \  No Known Allergies  PMHx:   Past Medical History:  Diagnosis Date  . Arthritis   . Crohn's colitis ([place])   . Elevated LFTs   . Hx of colonic polyp   . Hyperlipidemia   . Hypertension   . Hypogonadism male   . Osteoporosis   . Psoriatic arthritis ([place])   . Renal glycosuria ([place])     Immunization History  Administered Date(s) Administered  . DT (Pediatric) 03/17/2014  . Moderna Sars-Covid-2 Vaccination 08/13/2019, 01/22/2020  . PPD Test 07/15/2016, 01/22/2017  . Pneumococcal Conjugate-13 09/04/2016  . Pneumococcal Polysaccharide-23 12/10/2017  . Pneumococcal-Unspecified 06/04/2003    Past Surgical History:  Procedure Laterality Date  . COLONOSCOPY    . POLYPECTOMY    . TONSILLECTOMY     age 45-5     FHx:    Reviewed / unchanged  SHx:    Reviewed / unchanged   Systems Review:  Constitutional: Denies fever, chills, wt changes, headaches, insomnia, fatigue, night sweats, change in appetite. Eyes: Denies redness, blurred vision, diplopia, discharge, itchy, watery eyes.  ENT: Denies discharge, congestion, post nasal drip, epistaxis, sore throat, earache, hearing loss, dental  pain, tinnitus, vertigo, sinus pain, snoring.  CV: Denies chest pain, palpitations, irregular heartbeat, syncope, dyspnea, diaphoresis, orthopnea, PND, claudication or edema. Respiratory: denies cough, dyspnea, DOE, pleurisy, hoarseness, laryngitis, wheezing.  Gastrointestinal: Denies dysphagia, odynophagia, heartburn, reflux, water brash, abdominal pain or cramps, nausea, vomiting, bloating, diarrhea, constipation, hematemesis, melena, hematochezia  or hemorrhoids. Genitourinary: Denies dysuria, frequency, urgency, nocturia, hesitancy, discharge, hematuria or flank pain. Musculoskeletal: Denies arthralgias, myalgias, stiffness, jt. swelling, pain, limping or strain/sprain.  Skin: Denies pruritus, rash, hives, warts, acne, eczema or change in skin lesion(s). Neuro: No weakness, tremor, incoordination, spasms, paresthesia or pain. Psychiatric: Denies confusion, memory loss or sensory loss. Endo: Denies change in weight, skin or hair change.  Heme/Lymph: No excessive bleeding, bruising or enlarged lymph nodes.  Physical Exam  BP 116/82   Pulse 89   Temp 97.9 F (36.6 C)   Resp 16   Ht 5' 9"  (1.753 m)   Wt 203 lb 6.4 oz (92.3 kg)   SpO2 96%   BMI 30.04 kg/m   Appears  well nourished, well groomed  and in no distress.  Eyes: PERRLA, EOMs, conjunctiva no swelling or erythema. Sinuses: No frontal/maxillary tenderness ENT/Mouth: EAC's clear, TM's nl w/o erythema, bulging. Nares clear w/o erythema, swelling, exudates. Oropharynx clear without erythema or exudates. Oral hygiene is good. Tongue normal, non obstructing. Hearing intact.  Neck: Supple. Thyroid not palpable. Car 2+/2+ without bruits, nodes or JVD. Chest: Respirations nl with BS clear & equal w/o rales, rhonchi, wheezing or stridor.  Cor: Heart sounds normal w/ regular rate and rhythm without sig. murmurs, gallops, clicks or rubs. Peripheral pulses normal and equal  without edema.  Abdomen: Soft & bowel sounds normal. Non-tender  w/o guarding, rebound, hernias, masses or organomegaly.  Lymphatics: Unremarkable.  Musculoskeletal: Full ROM all peripheral extremities, joint stability, 5/5 strength and normal gait.  Skin: Warm, dry without exposed rashes, lesions or ecchymosis apparent.  Neuro: Cranial nerves intact, reflexes equal bilaterally. Sensory-motor testing grossly intact. Tendon reflexes grossly intact.  Pysch: Alert & oriented x 3.  Insight and judgement nl & appropriate. No ideations.  Assessment and Plan:  1. Essential hypertension  - Continue medication, monitor blood pressure at home.  - Continue DASH diet.  Reminder to go to the ER if any CP,  SOB, nausea, dizziness, severe  HA, changes vision/speech.  - CBC with Differential/Platelet - COMPLETE METABOLIC PANEL WITH GFR - Magnesium - TSH  2. Hyperlipidemia associated with type 2 diabetes mellitus ([place])  - Continue diet/meds, exercise,& lifestyle modifications.  - Continue monitor periodic cholesterol/liver & renal functions   - Lipid panel - TSH  3. Type 2 diabetes mellitus with stage 2 chronic kidney  disease, without long-term current use of insulin ([facility])  - Continue diet, exercise  - Lifestyle modifications.  - Monitor appropriate labs.  - Hemoglobin A1c - Insulin, random  4. Vitamin D deficiency  - Continue supplementation.  - VITAMIN D 25 Hydroxy   5. Psoriatic arthritis ([place])   6. Crohn's disease without complication ([place])   7. Medication management  - CBC with Differential/Platelet - COMPLETE METABOLIC PANEL WITH GFR - Magnesium - Lipid panel - TSH - Hemoglobin A1c - Insulin, random - VITAMIN D 25 Hydroxy        Discussed  regular exercise, BP monitoring, weight control to achieve/maintain BMI less than 25 and discussed med and SE's. Recommended labs to assess and monitor clinical status with further disposition pending results of labs.  I discussed the assessment and treatment plan with the patient. The patient  was provided an opportunity to ask questions and all were answered. The patient agreed with the plan and demonstrated an understanding of the instructions.  I provided over 30 minutes of exam, counseling, chart review and  complex critical decision making.         The patient was advised to call back or seek an in-person evaluation if the symptoms worsen or if the condition fails to improve as anticipated.   Kirtland Bouchard, MD

## 2020-06-12 ENCOUNTER — Other Ambulatory Visit: Payer: Self-pay

## 2020-06-12 ENCOUNTER — Ambulatory Visit (INDEPENDENT_AMBULATORY_CARE_PROVIDER_SITE_OTHER): Payer: Medicare Other | Admitting: Internal Medicine

## 2020-06-12 VITALS — BP 116/82 | HR 89 | Temp 97.9°F | Resp 16 | Ht 69.0 in | Wt 203.4 lb

## 2020-06-12 DIAGNOSIS — E559 Vitamin D deficiency, unspecified: Secondary | ICD-10-CM

## 2020-06-12 DIAGNOSIS — N182 Chronic kidney disease, stage 2 (mild): Secondary | ICD-10-CM

## 2020-06-12 DIAGNOSIS — E1122 Type 2 diabetes mellitus with diabetic chronic kidney disease: Secondary | ICD-10-CM | POA: Diagnosis not present

## 2020-06-12 DIAGNOSIS — E785 Hyperlipidemia, unspecified: Secondary | ICD-10-CM | POA: Diagnosis not present

## 2020-06-12 DIAGNOSIS — Z79899 Other long term (current) drug therapy: Secondary | ICD-10-CM

## 2020-06-12 DIAGNOSIS — L4050 Arthropathic psoriasis, unspecified: Secondary | ICD-10-CM | POA: Diagnosis not present

## 2020-06-12 DIAGNOSIS — I10 Essential (primary) hypertension: Secondary | ICD-10-CM

## 2020-06-12 DIAGNOSIS — K5090 Crohn's disease, unspecified, without complications: Secondary | ICD-10-CM | POA: Diagnosis not present

## 2020-06-12 DIAGNOSIS — E1169 Type 2 diabetes mellitus with other specified complication: Secondary | ICD-10-CM | POA: Diagnosis not present

## 2020-06-12 MED ORDER — AMOXICILLIN-POT CLAVULANATE 875-125 MG PO TABS: ORAL_TABLET | ORAL | 3 refills | Status: DC

## 2020-06-13 LAB — COMPLETE METABOLIC PANEL WITH GFR
AG Ratio: 1.6 (calc) (ref 1.0–2.5)
ALT: 69 U/L — ABNORMAL HIGH (ref 9–46)
AST: 76 U/L — ABNORMAL HIGH (ref 10–35)
Albumin: 4.6 g/dL (ref 3.6–5.1)
Alkaline phosphatase (APISO): 38 U/L (ref 35–144)
BUN: 16 mg/dL (ref 7–25)
CO2: 24 mmol/L (ref 20–32)
Calcium: 10 mg/dL (ref 8.6–10.3)
Chloride: 103 mmol/L (ref 98–110)
Creat: 0.95 mg/dL (ref 0.70–1.18)
GFR, Est African American: 92 mL/min/{1.73_m2} (ref >=60)
GFR, Est Non African American: 80 mL/min/{1.73_m2} (ref >=60)
Globulin: 2.9 g/dL (calc) (ref 1.9–3.7)
Glucose, Bld: 106 mg/dL — ABNORMAL HIGH (ref 65–99)
Potassium: 4.1 mmol/L (ref 3.5–5.3)
Sodium: 137 mmol/L (ref 135–146)
Total Bilirubin: 0.9 mg/dL (ref 0.2–1.2)
Total Protein: 7.5 g/dL (ref 6.1–8.1)

## 2020-06-13 LAB — CBC WITH DIFFERENTIAL/PLATELET
Absolute Monocytes: 648 cells/uL (ref 200–950)
Basophils Absolute: 49 cells/uL (ref 0–200)
Basophils Relative: 0.6 %
Eosinophils Absolute: 113 cells/uL (ref 15–500)
Eosinophils Relative: 1.4 %
HCT: 48.8 % (ref 38.5–50.0)
Hemoglobin: 17.1 g/dL (ref 13.2–17.1)
Lymphs Abs: 2786 cells/uL (ref 850–3900)
MCH: 31.8 pg (ref 27.0–33.0)
MCHC: 35 g/dL (ref 32.0–36.0)
MCV: 90.9 fL (ref 80.0–100.0)
MPV: 11.2 fL (ref 7.5–12.5)
Monocytes Relative: 8 %
Neutro Abs: 4504 cells/uL (ref 1500–7800)
Neutrophils Relative %: 55.6 %
Platelets: 333 10*3/uL (ref 140–400)
RBC: 5.37 10*6/uL (ref 4.20–5.80)
RDW: 13 % (ref 11.0–15.0)
Total Lymphocyte: 34.4 %
WBC: 8.1 10*3/uL (ref 3.8–10.8)

## 2020-06-13 LAB — MAGNESIUM: Magnesium: 2.3 mg/dL (ref 1.5–2.5)

## 2020-06-13 LAB — LIPID PANEL
Cholesterol: 117 mg/dL (ref <200)
HDL: 42 mg/dL (ref >=40)
LDL Cholesterol (Calc): 50 mg/dL (calc)
Non-HDL Cholesterol (Calc): 75 mg/dL (calc) (ref <130)
Total CHOL/HDL Ratio: 2.8 (calc) (ref <5.0)
Triglycerides: 176 mg/dL — ABNORMAL HIGH (ref <150)

## 2020-06-13 LAB — HEMOGLOBIN A1C
Hgb A1c MFr Bld: 7.6 % of total Hgb — ABNORMAL HIGH (ref <5.7)
Mean Plasma Glucose: 171 mg/dL
eAG (mmol/L): 9.5 mmol/L

## 2020-06-13 LAB — TSH: TSH: 1.36 mIU/L (ref 0.40–4.50)

## 2020-06-13 LAB — INSULIN, RANDOM: Insulin: 17.2 u[IU]/mL

## 2020-06-13 LAB — VITAMIN D 25 HYDROXY (VIT D DEFICIENCY, FRACTURES): Vit D, 25-Hydroxy: 80 ng/mL (ref 30–100)

## 2020-06-13 NOTE — Progress Notes (Signed)
========================================================== -   Test results slightly outside the reference range are not unusual. If there is anything important, I will review this with you,  otherwise it is considered normal test values.  If you have further questions,  please do not hesitate to contact me at the office or via My Chart.  ========================================================== ==========================================================  - Chol = 117 -  Excellent   - Very low risk for Heart Attack  / Stroke ========================================================  - A1c slightly worse - Up from 7.3% to Now 7.6% -   - Need to work harder on diet & weight loss , so don't have to   start on Insulin or other very expensive new Diabetic medication ==========================================================  Being diabetic has a  300% increased risk for heart attack,  stroke, cancer, and alzheimer- type vascular dementia.   It is very important that you work harder with diet by  avoiding all foods that are white except chicken,   fish & calliflower.  - Avoid white rice  (brown & wild rice is OK),   - Avoid white potatoes  (sweet potatoes in moderation is OK),   White bread or wheat bread or anything made out of   white flour like bagels, donuts, rolls, buns, biscuits, cakes,  - pastries, cookies, pizza crust, and pasta (made from  white flour & egg whites)   - vegetarian pasta or spinach or wheat pasta is OK.  - Multigrain breads like Arnold's, Pepperidge Farm or   multigrain sandwich thins or high fiber breads like   [place] bread or "Dave's Killer" breads that are  4 to 5 grams fiber per slice !  are best.    Diet, exercise and weight loss can reverse and cure  diabetes in the early stages.    - Diet, exercise and weight loss is very important in the   control and prevention of complications of diabetes which  affects every system in your body, ie.    -Brain - dementia/stroke,  - eyes - glaucoma/blindness,  - heart - heart attack/heart failure,  - kidneys - dialysis,  - stomach - gastric paralysis,  - intestines - malabsorption,  - nerves - severe painful neuritis,  - circulation - gangrene & loss of a leg(s)  - and finally  . . . . . . . . . . . . . . . . . .    - cancer and Alzheimers.  ==========================================================  - Vitamin D = 80 - Excellent  ==========================================================  - All Else - CBC - Kidneys - Electrolytes - Liver - Magnesium & Thyroid    - all  Normal / OK ===========================================================  ===========================================================

## 2020-07-03 ENCOUNTER — Other Ambulatory Visit: Payer: Self-pay | Admitting: Internal Medicine

## 2020-07-03 DIAGNOSIS — R450 Nervousness: Secondary | ICD-10-CM

## 2020-07-09 ENCOUNTER — Other Ambulatory Visit: Payer: Self-pay | Admitting: Adult Health Nurse Practitioner

## 2020-07-26 ENCOUNTER — Other Ambulatory Visit: Payer: Self-pay | Admitting: Internal Medicine

## 2020-07-26 DIAGNOSIS — E782 Mixed hyperlipidemia: Secondary | ICD-10-CM

## 2020-08-10 ENCOUNTER — Other Ambulatory Visit: Payer: Self-pay | Admitting: Adult Health

## 2020-08-10 ENCOUNTER — Other Ambulatory Visit: Payer: Self-pay | Admitting: Adult Health Nurse Practitioner

## 2020-08-10 DIAGNOSIS — E1165 Type 2 diabetes mellitus with hyperglycemia: Secondary | ICD-10-CM

## 2020-08-10 MED ORDER — METFORMIN HCL ER 500 MG PO TB24: ORAL_TABLET | ORAL | 3 refills | Status: DC

## 2020-08-14 ENCOUNTER — Other Ambulatory Visit: Payer: Self-pay | Admitting: Adult Health Nurse Practitioner

## 2020-08-14 DIAGNOSIS — E782 Mixed hyperlipidemia: Secondary | ICD-10-CM

## 2020-08-22 ENCOUNTER — Other Ambulatory Visit: Payer: Self-pay

## 2020-08-22 ENCOUNTER — Ambulatory Visit (INDEPENDENT_AMBULATORY_CARE_PROVIDER_SITE_OTHER): Payer: Medicare Other | Admitting: Adult Health Nurse Practitioner

## 2020-08-22 ENCOUNTER — Encounter: Payer: Self-pay | Admitting: Adult Health Nurse Practitioner

## 2020-08-22 VITALS — BP 126/80 | HR 84 | Temp 96.7°F | Ht 69.0 in | Wt 205.0 lb

## 2020-08-22 DIAGNOSIS — I10 Essential (primary) hypertension: Secondary | ICD-10-CM

## 2020-08-22 DIAGNOSIS — Z0001 Encounter for general adult medical examination with abnormal findings: Secondary | ICD-10-CM | POA: Diagnosis not present

## 2020-08-22 DIAGNOSIS — E669 Obesity, unspecified: Secondary | ICD-10-CM

## 2020-08-22 DIAGNOSIS — L4050 Arthropathic psoriasis, unspecified: Secondary | ICD-10-CM | POA: Diagnosis not present

## 2020-08-22 DIAGNOSIS — E66811 Obesity, class 1: Secondary | ICD-10-CM

## 2020-08-22 DIAGNOSIS — E785 Hyperlipidemia, unspecified: Secondary | ICD-10-CM

## 2020-08-22 DIAGNOSIS — R6889 Other general symptoms and signs: Secondary | ICD-10-CM | POA: Diagnosis not present

## 2020-08-22 DIAGNOSIS — M810 Age-related osteoporosis without current pathological fracture: Secondary | ICD-10-CM | POA: Diagnosis not present

## 2020-08-22 DIAGNOSIS — E1122 Type 2 diabetes mellitus with diabetic chronic kidney disease: Secondary | ICD-10-CM | POA: Diagnosis not present

## 2020-08-22 DIAGNOSIS — Z0000 Encounter for general adult medical examination without abnormal findings: Secondary | ICD-10-CM

## 2020-08-22 DIAGNOSIS — E1169 Type 2 diabetes mellitus with other specified complication: Secondary | ICD-10-CM | POA: Diagnosis not present

## 2020-08-22 DIAGNOSIS — Z860100 Personal history of colon polyps, unspecified: Secondary | ICD-10-CM

## 2020-08-22 DIAGNOSIS — Z79899 Other long term (current) drug therapy: Secondary | ICD-10-CM

## 2020-08-22 DIAGNOSIS — E559 Vitamin D deficiency, unspecified: Secondary | ICD-10-CM

## 2020-08-22 DIAGNOSIS — N182 Chronic kidney disease, stage 2 (mild): Secondary | ICD-10-CM

## 2020-08-22 DIAGNOSIS — K5090 Crohn's disease, unspecified, without complications: Secondary | ICD-10-CM | POA: Diagnosis not present

## 2020-08-22 MED ORDER — AMOXICILLIN-POT CLAVULANATE 875-125 MG PO TABS: ORAL_TABLET | ORAL | 3 refills | Status: DC

## 2020-08-22 NOTE — Progress Notes (Signed)
MEDICARE ANNUAL WELLNESS VISIT AND FOLLOW UP  Assessment:   Melvin Shelton was seen today for follow-up and medicare wellness.  Diagnoses and all orders for this visit:  Encounter for Medicare annual wellness exam Yearly  Essential hypertension Continue medication Monitor blood pressure at home; call if consistently over 130/80 Continue DASH diet.   Reminder to go to the ER if any CP, SOB, nausea, dizziness, severe HA, changes vision/speech, left arm numbness and tingling and jaw pain.  Mixed hyperlipidemia -     Lipid panel  Type 2 diabetes mellitus with stage 2 chronic kidney disease, without long-term current use of insulin (HCC) Controlled by diet Discussed dietary and exercise modifications -     Hemoglobin A1c -     Insulin, random  Psoriatic arthritis ([place]) Followed by Dr. Lyman Speller Risankizumab injections Q12 weeks Contact regarding timing with COVID vaccination? Follows with Dr Lyman Speller  Osteoporosis without current pathological fracture, unspecified osteoporosis type Continue Vit D and Ca, weight bearing exercises  Vitamin D deficiency At goal at recent check; continue to recommend supplementation for goal of 70-100 Defer vitamin D level  Hx of colonic polyp UTD on colonoscopy  BMI 31.0-231.9,adult Long discussion about weight loss, diet, and exercise Recommended diet heavy in fruits and veggies and low in animal meats, cheeses, and dairy products, appropriate calorie intake Discussed appropriate weight for height  Follow up at next visit  Primary insomnia  Discussed good sleep hygiene, decrease stimulation prior to sleep Increase day time activity Avoid caffeine in evenings Continue trazodone with benefit  Vitamin D deficiency Continue supplementation Taking Vitamin D 1,000IU daily   Crohn's disease without complication, unspecified gastrointestinal tract location [facility]) Doing well at this time Previous medication induced.  Has repeat colonoscopy scheduled,  May 2021.  Medication management Continued  Over 30 minutes of face to face interview, exam, counseling, chart review, and critical decision making was performed.  Future Appointments  Date Time Provider [place]  01/24/2021  3:00 PM Unk Pinto, MD [facility] None  08/22/2021  2:30 PM Liane Comber, NP [facility] None     Plan:   During the course of the visit the patient was educated and counseled about appropriate screening and preventive services including:    Pneumococcal vaccine   Influenza vaccine  Prevnar 13  Td vaccine  Screening electrocardiogram  Colorectal cancer screening  Diabetes screening  Glaucoma screening  Nutrition counseling    Subjective:  Melvin Shelton is a 75 y.o. male who presents for Medicare Annual Wellness Visit and 3 month follow up for HTN, HLD, prediabetes, and vitamin D Def.   He reports that six weeks ago he had a collision with a large dog to the left inner knee.  Reports he did not have any swelling or bruising with this.  He was using some ibuprofen and reports is has continue to impvoe, almost resolved at this point.  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  He is taking Risankizumab injections Q12weeks. And follows with Dr Lyman Speller.   He is due for follow up colonoscopy, Q1year.  Pandemic 2019 pushed this back and patient has not called to schedule this. Discussed at length with patient today.  BMI is Body mass index is 30.27 kg/m., he has been working on diet and exercise. Wt Readings from Last 3 Encounters:  08/22/20  205 lb (93 kg)  06/12/20 203 lb 6.4 oz (92.3 kg)  03/08/20 210 lb (95.3 kg)   His blood pressure has been controlled at home, today their BP is BP: 126/80  He does not workout, is on tour so it makes it  difficult.  He denies chest pain, shortness of breath, dizziness.   He is on cholesterol medication, rosuvastatin 74m and fenofibrate 1644mand denies myalgias. His cholesterol is not at goal. The cholesterol last visit was:   Lab Results  Component Value Date   CHOL 214 (H) 04/13/2019   HDL 35 (L) 04/13/2019   LDBabb11/03/2019     Comment:     . LDL cholesterol not calculated. Triglyceride levels greater than 400 mg/dL invalidate calculated LDL results. . Reference range: <100 . Desirable range <100 mg/dL for primary prevention;   <70 mg/dL for patients with CHD or diabetic patients  with > or = 2 CHD risk factors. . Marland KitchenDL-C is now calculated using the Martin-Hopkins  calculation, which is a validated novel method providing  better accuracy than the Friedewald equation in the  estimation of LDL-C.  MaCresenciano Genret al. JAAnnamaria [id number];[id number]  (http://education.[employer].com/faq/FAQ164)    TRIG 621 (H) 04/13/2019   CHOLHDL 6.1 (H) 04/13/2019    He has been working on diet and exercise for DMII last check in prediabetes range and diet controlled, and denies foot ulcerations, increased appetite, nausea, paresthesia of the feet, polydipsia, polyuria, visual disturbances, vomiting and weight loss. Last A1C in the office was:  Lab Results  Component Value Date   HGBA1C 7.6 (H) 06/12/2020      Lab Results  Component Value Date   GFRNONAA 80 06/12/2020      Patient is on Vitamin D supplement.   Lab Results  Component Value Date   VD25OH 58 04/13/2019       Medication Review: Current Outpatient Medications on File Prior to Visit  Medication Sig Dispense Refill  . ALPRAZolam (XANAX) 0.5 MG tablet TAKE 1/2 TO 1 TABLET BY MOUTH 2 TO 3 TIME DAILY ONLY IF NEEDED TO SLEEP. LIMIT 5 DAYS PER WEEK TO AVOID ADDICTION AND DEMENTIA 30 tablet 0  . amLODipine (NORVASC) 5 MG tablet Take      1 tablet       Daily       for BP 90 tablet 0  . aspirin 81 MG tablet Take 81 mg by  mouth daily.    . Calcium Carbonate (CALCIUM 600 PO) Take by mouth daily.    . fenofibrate 160 MG tablet Take  1 tablet  Daily  for Triglycerides (Blood Fats) 90 tablet 0  . Magnesium 500 MG CAPS Take 500 mg by mouth daily.    . metFORMIN (GLUCOPHAGE XR) 500 MG 24 hr tablet Take 2 tablets 2 x /day with Meals for Diabetes 360 tablet 3  . Omega-3 Fatty Acids (FISH OIL) 1200 MG CAPS Take by mouth daily.     . Marland KitchenVER THE COUNTER MEDICATION Vitamin C. 500 mg. One tablet daily.    . Risankizumab-rzaa,150 MG Dose, 75 MG/0.83ML PSKT Inject into the skin. Inject every 12 weeks    . rosuvastatin (CRESTOR) 20 MG tablet TAKE 1 TABLET(20 MG) BY MOUTH AT BEDTIME 90 tablet 3  . traZODone (DESYREL) 150 MG tablet Take      1/2 to 1 tablet        1 hour       before Bedtime for sleep 90 tablet 0  .  VITAMIN D PO Take 10,000 Units by mouth daily.     No current facility-administered medications on file prior to visit.    Allergies: No Known Allergies  Current Problems (verified) has Hypertension; Psoriatic arthritis ([place]); Hyperlipidemia associated with type 2 diabetes mellitus ([place]); Osteoporosis; Elevated LFTs; Hx of colonic polyp; Medication management; Vitamin D deficiency; Encounter for Medicare annual wellness exam; Multiple atypical nevi; Crohn disease ([place]); Obesity (BMI 30.0-34.9); T2DM (type 2 diabetes mellitus) ([place]); CKD stage 2 due to type 2 diabetes mellitus ([place]); and Insomnia on their problem list.  Screening Tests Immunization History  Administered Date(s) Administered  . DT (Pediatric) 03/17/2014  . Moderna Sars-Covid-2 Vaccination 07/16/2019, 08/13/2019, 01/22/2020  . PPD Test 06/13/2014, 07/19/2015, 07/15/2016, 01/22/2017  . Pneumococcal Conjugate-13 09/04/2016  . Pneumococcal Polysaccharide-23 12/10/2017  . Pneumococcal-Unspecified 06/04/2003    Preventative care: Last colonoscopy: 2019 (after positive cologard) - repeat 1 year- Dr. Loletha Carrow.  Potentially schedule for May DUE discussed  with patient  Prior vaccinations: DT: 2015 Influenza: declines Pneumonia: 2018 Prevnar: 2018 Zoster: declines  Names of Other Physician/Practitioners you currently use: 1. [facility] here for primary care 2. Has not gotten checked lately, eye doctor, last visit 2016  3. Does not see, dentist, last visit 2016  Patient Care Team: Unk Pinto, MD as PCP - General (Internal Medicine) Inda Castle, MD (Inactive) as Consulting Physician (Gastroenterology) Orville Govern, MD as Consulting Physician  Surgical: He  has a past surgical history that includes Colonoscopy; Polypectomy; and Tonsillectomy. Family His family history includes Diabetes in his father; Heart disease in his father; Hypertension in his father; Lymphoma in his mother; Prostate cancer in his father. Social history  He reports that he has never smoked. He has never used smokeless tobacco. He reports current alcohol use. He reports that he does not use drugs.  MEDICARE WELLNESS OBJECTIVES: Physical activity: Current Exercise Habits: Home exercise routine, Type of exercise: walking, Time (Minutes): 30, Frequency (Times/Week): 7, Weekly Exercise (Minutes/Week): 210, Intensity: Mild, Exercise limited by: None identified Cardiac risk factors: Cardiac Risk Factors include: advanced age (>84mn, >>27women);dyslipidemia;hypertension;male gender;diabetes mellitus;obesity (BMI >30kg/m2) Depression/mood screen:   Dep[facility] 08/22/2020  Decreased Interest 0  Down, Depressed, Hopeless 0  PHQ - 2 Score 0    ADLs:  In your present state of health, do you have any difficulty performing the following activities: 08/22/2020 06/11/2020  Hearing? Y N  Vision? N N  Difficulty concentrating or making decisions? N N  Walking or climbing stairs? N N  Dressing or bathing? N N  Doing errands, shopping? N N  Preparing Food and eating ? N -  Using the Toilet? N -  In the past six  months, have you accidently leaked urine? N -  Do you have problems with loss of bowel control? N -  Managing your Medications? N -  Managing your Finances? N -  Housekeeping or managing your Housekeeping? N -  Some recent data might be hidden     Cognitive Testing  Alert? Yes  Normal Appearance?Yes  Oriented to person? Yes  Place? Yes   Time? Yes  Recall of three objects?  Yes  Can perform simple calculations? Yes  Displays appropriate judgment?Yes  Can read the correct time from a watch face?Yes  EOL planning: Does Patient Have a Medical Advance Directive?: No Would patient like information on creating a medical advance directive?: No - Patient declined No- declines papers Discussed with patient  Objective:  Today's Vitals   08/22/20 1452  BP: 126/80  Pulse: 84  Temp: (!) 96.7 F (35.9 C)  SpO2: 95%  Weight: 205 lb (93 kg)  Height: 5' 9"  (1.753 m)  PainSc: 0-No pain   Body mass index is 30.27 kg/m.  Wt Readings from Last 3 Encounters:  08/22/20 205 lb (93 kg)  06/12/20 203 lb 6.4 oz (92.3 kg)  03/08/20 210 lb (95.3 kg)    General appearance: alert, no distress, WD/WN, male HEENT: normocephalic, sclerae anicteric, TMs pearly, nares patent, no discharge or erythema, pharynx normal Oral cavity: MMM, no lesions Neck: supple, no lymphadenopathy, no thyromegaly, no masses Heart: RRR, normal S1, S2, no murmurs Lungs: CTA bilaterally, no wheezes, rhonchi, or rales Abdomen: +BS, obese soft, non tender, non distended, no masses, no hepatomegaly, no splenomegaly Musculoskeletal: nontender, no swelling; obvious bony joint deformity/enlargement of DIP joints of bilateral hands and CMP joints without effusion or heat.  Extremities: no edema, no cyanosis, no clubbing Pulses: 2+ symmetric, upper and lower extremities, normal cap refill Neurological: alert, oriented x 3, CN2-12 intact, strength normal upper extremities and lower extremities, sensation normal throughout, DTRs  2+ throughout, no cerebellar signs, gait normal Psychiatric: normal affect, behavior normal, pleasant   Medicare Attestation I have personally reviewed: The patient's medical and social history Their use of alcohol, tobacco or illicit drugs Their current medications and supplements The patient's functional ability including ADLs,fall risks, home safety risks, cognitive, and hearing and visual impairment Diet and physical activities Evidence for depression or mood disorders  The patient's weight, height, BMI, and visual acuity have been recorded in the chart.  I have made referrals, counseling, and provided education to the patient based on review of the above and I have provided the patient with a written personalized care plan for preventive services.     Garnet Sierras, NP   08/22/2020

## 2020-08-22 NOTE — Patient Instructions (Signed)
   Colonoscopy, call to scheudle this.

## 2020-08-23 LAB — CBC WITH DIFFERENTIAL/PLATELET
Absolute Monocytes: 556 cells/uL (ref 200–950)
Basophils Absolute: 58 cells/uL (ref 0–200)
Basophils Relative: 0.7 %
Eosinophils Absolute: 108 cells/uL (ref 15–500)
Eosinophils Relative: 1.3 %
HCT: 48.8 % (ref 38.5–50.0)
Hemoglobin: 17.2 g/dL — ABNORMAL HIGH (ref 13.2–17.1)
Lymphs Abs: 2523 cells/uL (ref 850–3900)
MCH: 31.9 pg (ref 27.0–33.0)
MCHC: 35.2 g/dL (ref 32.0–36.0)
MCV: 90.4 fL (ref 80.0–100.0)
MPV: 11.1 fL (ref 7.5–12.5)
Monocytes Relative: 6.7 %
Neutro Abs: 5055 cells/uL (ref 1500–7800)
Neutrophils Relative %: 60.9 %
Platelets: 324 10*3/uL (ref 140–400)
RBC: 5.4 10*6/uL (ref 4.20–5.80)
RDW: 12.9 % (ref 11.0–15.0)
Total Lymphocyte: 30.4 %
WBC: 8.3 10*3/uL (ref 3.8–10.8)

## 2020-08-23 LAB — COMPLETE METABOLIC PANEL WITH GFR
AG Ratio: 1.5 (calc) (ref 1.0–2.5)
ALT: 86 U/L — ABNORMAL HIGH (ref 9–46)
AST: 114 U/L — ABNORMAL HIGH (ref 10–35)
Albumin: 4.3 g/dL (ref 3.6–5.1)
Alkaline phosphatase (APISO): 45 U/L (ref 35–144)
BUN: 10 mg/dL (ref 7–25)
CO2: 26 mmol/L (ref 20–32)
Calcium: 9.7 mg/dL (ref 8.6–10.3)
Chloride: 100 mmol/L (ref 98–110)
Creat: 0.89 mg/dL (ref 0.70–1.18)
GFR, Est African American: 99 mL/min/{1.73_m2} (ref >=60)
GFR, Est Non African American: 85 mL/min/{1.73_m2} (ref >=60)
Globulin: 2.8 g/dL (calc) (ref 1.9–3.7)
Glucose, Bld: 162 mg/dL — ABNORMAL HIGH (ref 65–99)
Potassium: 4 mmol/L (ref 3.5–5.3)
Sodium: 136 mmol/L (ref 135–146)
Total Bilirubin: 0.8 mg/dL (ref 0.2–1.2)
Total Protein: 7.1 g/dL (ref 6.1–8.1)

## 2020-08-23 LAB — HEMOGLOBIN A1C
Hgb A1c MFr Bld: 8.7 % of total Hgb — ABNORMAL HIGH (ref <5.7)
Mean Plasma Glucose: 203 mg/dL
eAG (mmol/L): 11.2 mmol/L

## 2020-08-23 LAB — LIPID PANEL
Cholesterol: 129 mg/dL (ref <200)
HDL: 38 mg/dL — ABNORMAL LOW (ref >=40)
Non-HDL Cholesterol (Calc): 91 mg/dL (calc) (ref <130)
Total CHOL/HDL Ratio: 3.4 (calc) (ref <5.0)
Triglycerides: 525 mg/dL — ABNORMAL HIGH (ref <150)

## 2020-08-30 ENCOUNTER — Ambulatory Visit: Payer: Medicare Other | Admitting: Adult Health

## 2020-08-31 ENCOUNTER — Other Ambulatory Visit: Payer: Self-pay | Admitting: Adult Health Nurse Practitioner

## 2020-08-31 DIAGNOSIS — R450 Nervousness: Secondary | ICD-10-CM

## 2020-09-05 ENCOUNTER — Other Ambulatory Visit: Payer: Self-pay | Admitting: Internal Medicine

## 2020-09-05 DIAGNOSIS — F5101 Primary insomnia: Secondary | ICD-10-CM

## 2020-09-11 ENCOUNTER — Ambulatory Visit: Payer: Medicare Other | Admitting: Adult Health Nurse Practitioner

## 2020-10-03 ENCOUNTER — Other Ambulatory Visit: Payer: Self-pay | Admitting: Adult Health

## 2020-10-03 DIAGNOSIS — R450 Nervousness: Secondary | ICD-10-CM

## 2020-10-25 ENCOUNTER — Other Ambulatory Visit: Payer: Self-pay | Admitting: Internal Medicine

## 2020-10-25 DIAGNOSIS — E782 Mixed hyperlipidemia: Secondary | ICD-10-CM

## 2020-11-03 ENCOUNTER — Other Ambulatory Visit: Payer: Self-pay | Admitting: Internal Medicine

## 2020-11-03 DIAGNOSIS — R450 Nervousness: Secondary | ICD-10-CM

## 2020-11-22 ENCOUNTER — Encounter: Payer: Medicare Other | Admitting: Internal Medicine

## 2020-12-25 ENCOUNTER — Other Ambulatory Visit: Payer: Self-pay

## 2020-12-25 MED ORDER — AMLODIPINE BESYLATE 5 MG PO TABS: ORAL_TABLET | ORAL | 0 refills | Status: DC

## 2020-12-28 ENCOUNTER — Other Ambulatory Visit: Payer: Self-pay | Admitting: Adult Health

## 2020-12-28 DIAGNOSIS — R450 Nervousness: Secondary | ICD-10-CM

## 2021-01-08 ENCOUNTER — Other Ambulatory Visit: Payer: Self-pay | Admitting: Nurse Practitioner

## 2021-01-08 DIAGNOSIS — E1169 Type 2 diabetes mellitus with other specified complication: Secondary | ICD-10-CM

## 2021-01-08 DIAGNOSIS — E782 Mixed hyperlipidemia: Secondary | ICD-10-CM

## 2021-01-08 DIAGNOSIS — E785 Hyperlipidemia, unspecified: Secondary | ICD-10-CM

## 2021-01-08 MED ORDER — FENOFIBRATE 160 MG PO TABS: ORAL_TABLET | ORAL | 1 refills | Status: DC

## 2021-01-24 ENCOUNTER — Other Ambulatory Visit: Payer: Self-pay

## 2021-01-24 ENCOUNTER — Ambulatory Visit (INDEPENDENT_AMBULATORY_CARE_PROVIDER_SITE_OTHER): Payer: Medicare Other | Admitting: Internal Medicine

## 2021-01-24 ENCOUNTER — Encounter: Payer: Self-pay | Admitting: Internal Medicine

## 2021-01-24 VITALS — BP 134/84 | HR 77 | Temp 97.9°F | Resp 17 | Ht 69.0 in | Wt 201.6 lb

## 2021-01-24 DIAGNOSIS — E559 Vitamin D deficiency, unspecified: Secondary | ICD-10-CM

## 2021-01-24 DIAGNOSIS — I10 Essential (primary) hypertension: Secondary | ICD-10-CM

## 2021-01-24 DIAGNOSIS — Z8249 Family history of ischemic heart disease and other diseases of the circulatory system: Secondary | ICD-10-CM

## 2021-01-24 DIAGNOSIS — E1169 Type 2 diabetes mellitus with other specified complication: Secondary | ICD-10-CM

## 2021-01-24 DIAGNOSIS — N138 Other obstructive and reflux uropathy: Secondary | ICD-10-CM

## 2021-01-24 DIAGNOSIS — E785 Hyperlipidemia, unspecified: Secondary | ICD-10-CM

## 2021-01-24 DIAGNOSIS — E1122 Type 2 diabetes mellitus with diabetic chronic kidney disease: Secondary | ICD-10-CM | POA: Diagnosis not present

## 2021-01-24 DIAGNOSIS — Z136 Encounter for screening for cardiovascular disorders: Secondary | ICD-10-CM

## 2021-01-24 DIAGNOSIS — Z125 Encounter for screening for malignant neoplasm of prostate: Secondary | ICD-10-CM

## 2021-01-24 DIAGNOSIS — Z79899 Other long term (current) drug therapy: Secondary | ICD-10-CM

## 2021-01-24 DIAGNOSIS — K5090 Crohn's disease, unspecified, without complications: Secondary | ICD-10-CM

## 2021-01-24 DIAGNOSIS — N401 Enlarged prostate with lower urinary tract symptoms: Secondary | ICD-10-CM

## 2021-01-24 DIAGNOSIS — Z1211 Encounter for screening for malignant neoplasm of colon: Secondary | ICD-10-CM

## 2021-01-24 DIAGNOSIS — Z87891 Personal history of nicotine dependence: Secondary | ICD-10-CM

## 2021-01-24 DIAGNOSIS — Z0001 Encounter for general adult medical examination with abnormal findings: Secondary | ICD-10-CM

## 2021-01-24 DIAGNOSIS — Z0000 Encounter for general adult medical examination without abnormal findings: Secondary | ICD-10-CM | POA: Diagnosis not present

## 2021-01-24 DIAGNOSIS — L4050 Arthropathic psoriasis, unspecified: Secondary | ICD-10-CM

## 2021-01-24 DIAGNOSIS — N182 Chronic kidney disease, stage 2 (mild): Secondary | ICD-10-CM

## 2021-01-24 DIAGNOSIS — M810 Age-related osteoporosis without current pathological fracture: Secondary | ICD-10-CM

## 2021-01-24 NOTE — Patient Instructions (Signed)

## 2021-01-24 NOTE — Progress Notes (Signed)
Annual  Screening/Preventative Visit  & Comprehensive Evaluation & Examination  Future Appointments  Date Time Provider [place]  01/24/2021  3:00 PM Unk Pinto, MD [facility] None  08/22/2021  2:30 PM Liane Comber, NP [facility] None  01/24/2022  3:00 PM Unk Pinto, MD [facility] None            This very nice 75 y.o. MWM presents for a Screening /Preventative Visit & comprehensive evaluation and management of multiple medical co-morbidities.  Patient has been followed for HTN, HLD, T2_NIDDM  and Vitamin D Deficiency.                                                     Patient has Guttate Psoriasis & Psoriatic Arthritis in remission on Cosentyx, but 07/2017, he  had Colonoscopy (Dr Loletha Carrow)  with  Bx (+) Crohn's in the TI, so Cosentyx was discontinued.  He is taking Risankizumab injections Orson Ape) Q12weeks and follows with his dermatologist  - Dr Lyman Speller.        HTN predates since 2013. Patient's BP has been controlled at home.  Today's BP is at goal -  134/84. Patient denies any cardiac symptoms as chest pain, palpitations, shortness of breath, dizziness or ankle swelling.       Patient's hyperlipidemia is controlled with diet and medications. Patient denies myalgias or other medication SE's. Last lipids were at goal for Cholesterol , but Trigs'  Lab Results  Component Value Date   CHOL 129 08/22/2020   HDL 38 (L) 08/22/2020   LDLCALC not calculated. 08/22/2020   TRIG 525 (H) 08/22/2020   CHOLHDL 3.4 08/22/2020         Patient has hx/o T2_NIDDM (A1c  6.5% /2012) and  in Feb 2021 , he was started on Metformin.   Patient denies reactive hypoglycemic symptoms, visual blurring, diabetic polys or paresthesias. Last A1c was not at goal:   Lab Results  Component Value Date   HGBA1C 8.7 (H) 08/22/2020          Finally, patient has history of Vitamin D Deficiency of    and last vitamin D was   Lab Results  Component Value Date   VD25OH 110 06/12/2020      Current Outpatient Medications on File Prior to Visit  Medication Sig   ALPRAZolam (XANAX) 0.5 MG tablet TAKE 1/2 TO 1 TABLET BY MOUTH AT BEDTIME ONLY IF NEEDED FOR SLEEP AND LIMIT TO 5 DAYS A WEEK TO AVOID ADDICTION AND DEMENTIA   amLODipine (NORVASC) 5 MG tablet Take      1 tablet       Daily       for BP   amoxicillin-clavulanate (AUGMENTIN) 875-125 MG tablet Take      1 tablet     2 x /day      with Food for Dental Infection   aspirin 81 MG tablet Take 81 mg by mouth daily.   Calcium Carbonate (CALCIUM 600 PO) Take by mouth daily.   fenofibrate 160 MG tablet TAKE 1 TABLET BY MOUTH DAILY FOR TRIGLYCERIDES( BLOOD FATS)   Magnesium 500 MG CAPS Take 500 mg by mouth daily.   metFORMIN (GLUCOPHAGE XR) 500 MG 24 hr tablet Take 2 tablets 2 x /day with Meals for Diabetes   Omega-3 Fatty Acids (FISH OIL) 1200 MG CAPS Take by  mouth daily.    OVER THE COUNTER MEDICATION Vitamin C. 500 mg. One tablet daily.   Risankizumab-rzaa,150 MG Dose, 75 MG/0.83ML PSKT Inject into the skin. Inject every 12 weeks   rosuvastatin (CRESTOR) 20 MG tablet TAKE 1 TABLET(20 MG) BY MOUTH AT BEDTIME   traZODone (DESYREL) 150 MG tablet Take  1 tablet  1 hour  before Bedtime  as needed for Sleep   VITAMIN D PO Take 10,000 Units by mouth daily.   No current facility-administered medications on file prior to visit.     Not on File   Past Medical History:  Diagnosis Date   Arthritis    Crohn's colitis ([place])    Elevated LFTs    Hx of colonic polyp    Hyperlipidemia    Hypertension    Hypogonadism male    Osteoporosis    Psoriatic arthritis ([place])    Renal glycosuria ([place])      Health Maintenance  Topic Date Due   OPHTHALMOLOGY EXAM  Never done   Zoster Vaccines- Shingrix (1 of 2) Never done   COLONOSCOPY (Pts 45-60yr Insurance coverage will need to be confirmed)  05/21/2019   COVID-19 Vaccine (4 - Booster for Moderna series) 04/23/2020   URINE MICROALBUMIN  11/22/2020   INFLUENZA VACCINE  01/01/2021    HEMOGLOBIN A1C  02/22/2021   FOOT EXAM  01/24/2022   TETANUS/TDAP  03/17/2024   Hepatitis C Screening  Completed   PNA vac Low Risk Adult  Completed   HPV VACCINES  Aged Out     Immunization History  Administered Date(s) Administered   DT (Pediatric) 03/17/2014   Moderna Sars-Covid-2 Vaccination 07/16/2019, 08/13/2019, 01/22/2020   PPD Test 06/13/2014, 07/19/2015, 07/15/2016, 01/22/2017   Pneumococcal Conjugate-13 09/04/2016   Pneumococcal Polysaccharide-23 12/10/2017   Pneumococcal-Unspecified 06/04/2003    Last Colon - 05/20/2018 - Dr DLoletha Carrow- Crohn's / Ileitis - active                     - recc treat & f/u 1 year  - Overdue - Patient aware  Past Surgical History:  Procedure Laterality Date   COLONOSCOPY     POLYPECTOMY     TONSILLECTOMY     age [age]     Family History  Problem Relation Age of Onset   Lymphoma Mother    Heart disease Father    Hypertension Father    Diabetes Father    Prostate cancer Father    Colon cancer Neg Hx    Esophageal cancer Neg Hx    Liver cancer Neg Hx    Pancreatic cancer Neg Hx    Rectal cancer Neg Hx    Stomach cancer Neg Hx    Colon polyps Neg Hx     Social History   Socioeconomic History   Marital status: Married    Spouse name: POlin Hauser  Number of children: None  Occupational History   Jazz musician  Tobacco Use   Smoking status: Never   Smokeless tobacco: Never  Vaping Use   Vaping Use: Never used  Substance and Sexual Activity   Alcohol use: Yes    Comment: ocassional   Drug use: No   Sexual activity: Not on file    ROS Constitutional: Denies fever, chills, weight loss/gain, headaches, insomnia,  night sweats or change in appetite. Does c/o fatigue. Eyes: Denies redness, blurred vision, diplopia, discharge, itchy or watery eyes.  ENT: Denies discharge, congestion, post nasal drip, epistaxis, sore throat, earache, hearing  loss, dental pain, Tinnitus, Vertigo, Sinus pain or snoring.  Cardio: Denies chest pain,  palpitations, irregular heartbeat, syncope, dyspnea, diaphoresis, orthopnea, PND, claudication or edema Respiratory: denies cough, dyspnea, DOE, pleurisy, hoarseness, laryngitis or wheezing.  Gastrointestinal: Denies dysphagia, heartburn, reflux, water brash, pain, cramps, nausea, vomiting, bloating, diarrhea, constipation, hematemesis, melena, hematochezia, jaundice or hemorrhoids Genitourinary: Denies dysuria, frequency, urgency, nocturia, hesitancy, discharge, hematuria or flank pain Musculoskeletal: Denies arthralgia, myalgia, stiffness, Jt. Swelling, pain, limp or strain/sprain. Denies Falls. Skin: Denies puritis, rash, hives, warts, acne, eczema or change in skin lesion Neuro: No weakness, tremor, incoordination, spasms, paresthesia or pain Psychiatric: Denies confusion, memory loss or sensory loss. Denies Depression. Endocrine: Denies change in weight, skin, hair change, nocturia, and paresthesia, diabetic polys, visual blurring or hyper / hypo glycemic episodes.  Heme/Lymph: No excessive bleeding, bruising or enlarged lymph nodes.   Physical Exam  BP 134/84   Pulse 77   Temp 97.9 F (36.6 C)   Resp 17   Ht 5' 9"  (1.753 m)   Wt 201 lb 9.6 oz (91.4 kg)   SpO2 96%   BMI 29.77 kg/m   General Appearance: Well nourished and well groomed and in no apparent distress.  Eyes: PERRLA, EOMs, conjunctiva no swelling or erythema, normal fundi and vessels. Sinuses: No frontal/maxillary tenderness ENT/Mouth: EACs patent / TMs  nl. Nares clear without erythema, swelling, mucoid exudates. Oral hygiene is good. No erythema, swelling, or exudate. Tongue normal, non-obstructing. Tonsils not swollen or erythematous. Hearing normal.  Neck: Supple, thyroid not palpable. No bruits, nodes or JVD. Respiratory: Respiratory effort normal.  BS equal and clear bilateral without rales, rhonci, wheezing or stridor. Cardio: Heart sounds are normal with regular rate and rhythm and no murmurs, rubs or gallops.  Peripheral pulses are normal and equal bilaterally without edema. No aortic or femoral bruits. Chest: symmetric with normal excursions and percussion.  Abdomen: Soft, with Nl bowel sounds. Nontender, no guarding, rebound, hernias, masses, or organomegaly.  Lymphatics: Non tender without lymphadenopathy.  Musculoskeletal: Full ROM all peripheral extremities, joint stability, 5/5 strength, and normal gait. Skin: Warm and dry without rashes, lesions, cyanosis, clubbing or  ecchymosis.  Neuro: Cranial nerves intact, reflexes equal bilaterally. Normal muscle tone, no cerebellar symptoms. Sensation intact.  Pysch: Alert and oriented X 3 with normal affect, insight and judgment appropriate.   Assessment and Plan  1. Annual Preventative/Screening Exam    2. Essential hypertension  - EKG 12-Lead - [place], RETROPERITNL ABD,  LTD - Urinalysis, Routine w reflex microscopic - Microalbumin / creatinine urine ratio - CBC with Differential/Platelet - COMPLETE METABOLIC PANEL WITH GFR - Magnesium - TSH  3. Hyperlipidemia associated with type 2 diabetes mellitus ([place])  - EKG 12-Lead - [place], RETROPERITNL ABD,  LTD - Lipid panel - TSH  4. Type 2 diabetes mellitus with stage 2 chronic kidney disease, without long-term current use of insulin (HCC)  - EKG 12-Lead - Urinalysis, Routine w reflex microscopic - Microalbumin / creatinine urine ratio - HM DIABETES FOOT EXAM - LOW EXTREMITY NEUR EXAM DOCUM - Hemoglobin A1c - Insulin, random  5. Vitamin D deficiency  - VITAMIN D 25 Hydroxy    8. Osteoporosis ,hx    9. BPH with obstruction/lower urinary tract symptoms  - PSA  10. Screening for colorectal cancer  - POC Hemoccult Bld/Stl   11. Prostate cancer screening  - PSA  12. Screening for ischemic heart disease  - EKG 12-Lead  13. FHx: heart disease  - EKG 12-Lead - [place], RETROPERITNL  ABD,  LTD  14. Former smoker  - EKG 12-Lead - [place], RETROPERITNL ABD,  LTD  15. Screening for  AAA (aortic abdominal aneurysm)  - [place], RETROPERITNL ABD,  LTD  16. Medication management  - Urinalysis, Routine w reflex microscopic - Microalbumin / creatinine urine ratio - CBC with Differential/Platelet - COMPLETE METABOLIC PANEL WITH GFR - Magnesium - Lipid panel - TSH - Hemoglobin A1c - Insulin, random - VITAMIN D 25 Hydroxy           Patient was counseled in prudent diet, weight control to achieve/maintain BMI less than 25, BP monitoring, regular exercise and medications as discussed.  Discussed med effects and SE's. Routine screening labs and tests as requested with regular follow-up as recommended. Over 40 minutes of exam, counseling, chart review and high complex critical decision making was performed   Kirtland Bouchard, MD

## 2021-01-25 LAB — URINALYSIS, ROUTINE W REFLEX MICROSCOPIC
Bacteria, UA: NONE SEEN /HPF
Bilirubin Urine: NEGATIVE
Glucose, UA: NEGATIVE
Hgb urine dipstick: NEGATIVE
Hyaline Cast: NONE SEEN /LPF
Ketones, ur: NEGATIVE
Nitrite: NEGATIVE
RBC / HPF: NONE SEEN /HPF (ref 0–2)
Specific Gravity, Urine: 1.009 (ref 1.001–1.035)
Squamous Epithelial / HPF: NONE SEEN /HPF (ref <=5)
pH: 5.5 (ref 5.0–8.0)

## 2021-01-25 LAB — COMPLETE METABOLIC PANEL WITH GFR
AG Ratio: 1.5 (calc) (ref 1.0–2.5)
ALT: 48 U/L — ABNORMAL HIGH (ref 9–46)
AST: 52 U/L — ABNORMAL HIGH (ref 10–35)
Albumin: 4.4 g/dL (ref 3.6–5.1)
Alkaline phosphatase (APISO): 33 U/L — ABNORMAL LOW (ref 35–144)
BUN: 10 mg/dL (ref 7–25)
CO2: 26 mmol/L (ref 20–32)
Calcium: 9.4 mg/dL (ref 8.6–10.3)
Chloride: 103 mmol/L (ref 98–110)
Creat: 0.84 mg/dL (ref 0.70–1.28)
Globulin: 2.9 g/dL (calc) (ref 1.9–3.7)
Glucose, Bld: 99 mg/dL (ref 65–99)
Potassium: 3.8 mmol/L (ref 3.5–5.3)
Sodium: 138 mmol/L (ref 135–146)
Total Bilirubin: 0.9 mg/dL (ref 0.2–1.2)
Total Protein: 7.3 g/dL (ref 6.1–8.1)
eGFR: 92 mL/min/{1.73_m2} (ref >=60)

## 2021-01-25 LAB — VITAMIN D 25 HYDROXY (VIT D DEFICIENCY, FRACTURES): Vit D, 25-Hydroxy: 61 ng/mL (ref 30–100)

## 2021-01-25 LAB — HEMOGLOBIN A1C
Hgb A1c MFr Bld: 6.7 % of total Hgb — ABNORMAL HIGH (ref <5.7)
Mean Plasma Glucose: 146 mg/dL
eAG (mmol/L): 8.1 mmol/L

## 2021-01-25 LAB — CBC WITH DIFFERENTIAL/PLATELET
Absolute Monocytes: 562 cells/uL (ref 200–950)
Basophils Absolute: 62 cells/uL (ref 0–200)
Basophils Relative: 0.8 %
Eosinophils Absolute: 193 cells/uL (ref 15–500)
Eosinophils Relative: 2.5 %
HCT: 45.1 % (ref 38.5–50.0)
Hemoglobin: 15.9 g/dL (ref 13.2–17.1)
Lymphs Abs: 2718 cells/uL (ref 850–3900)
MCH: 31.7 pg (ref 27.0–33.0)
MCHC: 35.3 g/dL (ref 32.0–36.0)
MCV: 90 fL (ref 80.0–100.0)
MPV: 10.9 fL (ref 7.5–12.5)
Monocytes Relative: 7.3 %
Neutro Abs: 4166 cells/uL (ref 1500–7800)
Neutrophils Relative %: 54.1 %
Platelets: 294 10*3/uL (ref 140–400)
RBC: 5.01 10*6/uL (ref 4.20–5.80)
RDW: 13.8 % (ref 11.0–15.0)
Total Lymphocyte: 35.3 %
WBC: 7.7 10*3/uL (ref 3.8–10.8)

## 2021-01-25 LAB — MICROALBUMIN / CREATININE URINE RATIO
Creatinine, Urine: 66 mg/dL (ref 20–320)
Microalb Creat Ratio: 226 mcg/mg creat — ABNORMAL HIGH (ref <30)
Microalb, Ur: 14.9 mg/dL

## 2021-01-25 LAB — TSH: TSH: 1.18 mIU/L (ref 0.40–4.50)

## 2021-01-25 LAB — INSULIN, RANDOM: Insulin: 16.8 u[IU]/mL

## 2021-01-25 LAB — MICROSCOPIC MESSAGE

## 2021-01-25 LAB — MAGNESIUM: Magnesium: 2 mg/dL (ref 1.5–2.5)

## 2021-01-25 LAB — PSA: PSA: 2.64 ng/mL (ref <=4.00)

## 2021-01-25 LAB — LIPID PANEL
Cholesterol: 128 mg/dL (ref <200)
HDL: 43 mg/dL (ref >=40)
LDL Cholesterol (Calc): 59 mg/dL (calc)
Non-HDL Cholesterol (Calc): 85 mg/dL (calc) (ref <130)
Total CHOL/HDL Ratio: 3 (calc) (ref <5.0)
Triglycerides: 193 mg/dL — ABNORMAL HIGH (ref <150)

## 2021-01-25 NOTE — Progress Notes (Signed)
============================================================ -   Test results slightly outside the reference range are not unusual. If there is anything important, I will review this with you,  otherwise it is considered normal test values.  If you have further questions,  please do not hesitate to contact me at the office or via My Chart.  ============================================================ ============================================================  -  PSA - Low - Great  ============================================================ ============================================================  -  Total Chol  =  128      &    LDL Chol = 59   - Both  Excellent   - Very low risk for Heart Attack  / Stroke ============================================================ ============================================================  -  A1c - Also Much Much Better - Down from 8.7 % to now 6.7% - Great - But. . . .   - Need to keep going   - Goal   A1c is less than 5.7%  !  ============================================================ ============================================================  -  Vitamin D = 61 --  Excellent   !  ============================================================ ============================================================  -  Keep up the Great Work  !   ============================================================ ============================================================

## 2021-02-07 ENCOUNTER — Other Ambulatory Visit: Payer: Self-pay | Admitting: Nurse Practitioner

## 2021-02-07 DIAGNOSIS — R450 Nervousness: Secondary | ICD-10-CM

## 2021-03-16 ENCOUNTER — Other Ambulatory Visit: Payer: Self-pay | Admitting: Nurse Practitioner

## 2021-03-16 DIAGNOSIS — R450 Nervousness: Secondary | ICD-10-CM

## 2021-04-04 ENCOUNTER — Other Ambulatory Visit: Payer: Self-pay | Admitting: Internal Medicine

## 2021-04-19 ENCOUNTER — Telehealth: Payer: Self-pay | Admitting: Internal Medicine

## 2021-04-19 NOTE — Chronic Care Management (AMB) (Signed)
  Chronic Care Management   Note  04/19/2021 Name: Melvin Shelton MRN: [id number] DOB: 12-30-1945  Melvin Shelton is a 75 y.o. year old male who is a primary care patient of Unk Pinto, MD. I reached out to Iona Hansen by phone today in response to a referral sent by Melvin Shelton PCP, Unk Pinto, MD.   Melvin Shelton was given information about Chronic Care Management services today including:  CCM service includes personalized support from designated clinical staff supervised by his physician, including individualized plan of care and coordination with other care providers 24/7 contact phone numbers for assistance for urgent and routine care needs. Service will only be billed when office clinical staff spend 20 minutes or more in a month to coordinate care. Only one practitioner may furnish and bill the service in a calendar month. The patient may stop CCM services at any time (effective at the end of the month) by phone call to the office staff.   Patient agreed to services and verbal consent obtained.   Follow up plan:   Tatjana Secretary/administrator

## 2021-05-07 NOTE — Progress Notes (Deleted)
3 MONTH FOLLOW UP Assessment:    Essential hypertension Continue medication; discussed benefit of transition to ACE/ARB, consider at future OV; defer for today per patient  Monitor blood pressure at home; call if consistently over 130/80 Continue DASH diet.   Reminder to go to the ER if any CP, SOB, nausea, dizziness, severe HA, changes vision/speech, left arm numbness and tingling and jaw pain.  Hyperlipidemia associated with T2DM (HCC) Continue medications - rosuvastatin, fenofibrate Discussed LDL goal <70, increase rosuvastatin if above goal, diet discussed Continue low cholesterol diet and exercise.  Check lipid panel.  -     Lipid panel  Type 2 diabetes mellitus with stage 2 chronic kidney disease, without long-term current use of insulin (HCC) Now on metformin 2000 mg daily and tolerating Discussed dietary and exercise modifications -     Hemoglobin A1c  CKD II associated with T2DM ([place]) Has been stable for many years;  Increase fluids, avoid NSAIDS, monitor sugars, will monitor  Psoriatic arthritis ([place]) Followed by Dr. Lyman Speller Risankizumab injections Q12 weeks  Vitamin D deficiency At goal at recent check; continue to recommend supplementation for goal of 60-100 Defer vitamin D level  Obesity (BMI 30+) Long discussion about weight loss, diet, and exercise Recommended diet heavy in fruits and veggies and low in animal meats, cheeses, and dairy products, appropriate calorie intake Discussed appropriate weight for height, goal of <190 lb set Increase fiber, add walking  Follow up at next visit  Primary insomnia   Discussed good sleep hygiene, decrease stimulation prior to sleep Increase day time activity, Avoid caffeine in evenings Continue trazodone with benefit, doing well limiting benzo    Crohn's disease without complication, unspecified gastrointestinal tract location [facility]) Doing well at this time, continue follow up Dr. Lyman Speller  Medication  management Continued  Over 30 minutes of exam, counseling, chart review, and critical decision making was performed  Future Appointments  Date Time Provider [place]  05/09/2021  2:30 PM Magda Bernheim, NP [facility] None  05/31/2021  3:00 PM Newton Pigg, [facility] None  08/22/2021  2:30 PM Liane Comber, NP [facility] None  01/24/2022  3:00 PM Unk Pinto, MD [facility] None     Subjective:  Melvin Shelton is a 75 y.o. male who presents for 3 month follow up for HTN, hyperlipidemia, T2DM with CKD II, and vitamin D Def.   [name] Patient has long standing [place] Psoriasis / Psoriatic Arthritis in remission on Cosentyx, but 07/2017 had Colonoscopy by Dr Loletha Carrow with  Bx (+) Crohn's in the TI, so Cosentyx was discontinued. He is taking Risankizumab injections Q12 weeks. And follows with Dr Lyman Speller.   He has insomnia, taking trazodone, xanax 0.5 mg, takes 1-2 days per week.   BMI is There is no height or weight on file to calculate BMI., he has been working on diet and exercise, trying to watch portions, but still admits could do better.  Planning to restart walking now that weather is improved, pushing water intake  Wt Readings from Last 3 Encounters:  01/24/21 201 lb 9.6 oz (91.4 kg)  08/22/20 205 lb (93 kg)  06/12/20 203 lb 6.4 oz (92.3 kg)   He does not check BPs at home, today their BP is    He does not workout. He denies chest pain, shortness of breath, dizziness.   He is on cholesterol medication (rosuvastatin 20 mg daily, fenofibrate 160 mg daily) and denies myalgias. His cholesterol is not at goal. The cholesterol last  visit was:   Lab Results  Component Value Date   CHOL 128 01/24/2021   HDL 43 01/24/2021   LDLCALC 59 01/24/2021   TRIG 193 (H) 01/24/2021   CHOLHDL 3.0 01/24/2021    He has been working on diet and exercise for T2DM with CKD II, lifestyle controlled for several years until Feb 2021 7+ and started on metformin 500 mg  BID, recently increased to 1000 mg BID and tolerating well, and denies increased appetite, nausea, paresthesia of the feet, polydipsia, polyuria, visual disturbances and vomiting. Doesn't have glucometer, prefers to not start checking if possible.  Last A1C in the office was:  Lab Results  Component Value Date   HGBA1C 6.7 (H) 01/24/2021    He has CKD II associated with T2DM monitored at this office  Lab Results  Component Value Date   [facility] 85 08/22/2020   Patient is on Vitamin D supplement.   Lab Results  Component Value Date   VD25OH 61 01/24/2021        Medication Review: Current Outpatient Medications on File Prior to Visit  Medication Sig Dispense Refill   ALPRAZolam (XANAX) 0.5 MG tablet TAKE 1/2 TO 1 TABLET AT BEDTIME ONLY IF NEEDED FOR SLEEP AND LIMIT TO 5 DAYS A WEEK TO AVOID ADDICTION AND DEMENTIA 30 tablet 0   amLODipine (NORVASC) 5 MG tablet TAKE 1 TABLET BY MOUTH DAILY FOR BLOOD PRESSURE 90 tablet 0   amoxicillin-clavulanate (AUGMENTIN) 875-125 MG tablet Take      1 tablet     2 x /day      with Food for Dental Infection 30 tablet 3   aspirin 81 MG tablet Take 81 mg by mouth daily.     Calcium Carbonate (CALCIUM 600 PO) Take by mouth daily.     fenofibrate 160 MG tablet TAKE 1 TABLET BY MOUTH DAILY FOR TRIGLYCERIDES( BLOOD FATS) 90 tablet 1   Magnesium 500 MG CAPS Take 500 mg by mouth daily.     metFORMIN (GLUCOPHAGE XR) 500 MG 24 hr tablet Take 2 tablets 2 x /day with Meals for Diabetes 360 tablet 3   Omega-3 Fatty Acids (FISH OIL) 1200 MG CAPS Take by mouth daily.      OVER THE COUNTER MEDICATION Vitamin C. 500 mg. One tablet daily.     Risankizumab-rzaa,150 MG Dose, 75 MG/0.83ML PSKT Inject into the skin. Inject every 12 weeks     rosuvastatin (CRESTOR) 20 MG tablet TAKE 1 TABLET(20 MG) BY MOUTH AT BEDTIME 90 tablet 3   traZODone (DESYREL) 150 MG tablet Take  1 tablet  1 hour  before Bedtime  as needed for Sleep 90 tablet 3   VITAMIN D PO Take 10,000 Units by  mouth daily.     No current facility-administered medications on file prior to visit.    Allergies: Not on File  Current Problems (verified) has Hypertension; Psoriatic arthritis ([place]); Hyperlipidemia associated with type 2 diabetes mellitus ([place]); Osteoporosis; Elevated LFTs; Hx of colonic polyp; Medication management; Vitamin D deficiency; Encounter for Medicare annual wellness exam; Multiple atypical nevi; Crohn disease ([place]); Obesity (BMI 30.0-34.9); T2DM (type 2 diabetes mellitus) ([place]); CKD stage 2 due to type 2 diabetes mellitus ([place]); and Insomnia on their problem list.   Surgical: He  has a past surgical history that includes Colonoscopy; Polypectomy; and Tonsillectomy. Family His family history includes Diabetes in his father; Heart disease in his father; Hypertension in his father; Lymphoma in his mother; Prostate cancer in his father. Social history  He reports that he has never smoked. He has never used smokeless tobacco. He reports current alcohol use. He reports that he does not use drugs.   Review of Systems  Constitutional:  Negative for malaise/fatigue and weight loss.  HENT:  Negative for hearing loss and tinnitus.   Eyes:  Negative for blurred vision and double vision.  Respiratory:  Negative for cough, shortness of breath and wheezing.   Cardiovascular:  Negative for chest pain, palpitations, orthopnea, claudication and leg swelling.  Gastrointestinal:  Negative for abdominal pain, blood in stool, constipation, diarrhea, heartburn, melena, nausea and vomiting.  Genitourinary: Negative.   Musculoskeletal:  Negative for joint pain and myalgias.  Skin:  Negative for rash.  Neurological:  Negative for dizziness, tingling, sensory change, weakness and headaches.  Endo/Heme/Allergies:  Negative for polydipsia.  Psychiatric/Behavioral: Negative.    All other systems reviewed and are negative.    Objective:   There were no vitals filed for this visit.  There is  no height or weight on file to calculate BMI.  Wt Readings from Last 3 Encounters:  01/24/21 201 lb 9.6 oz (91.4 kg)  08/22/20 205 lb (93 kg)  06/12/20 203 lb 6.4 oz (92.3 kg)    General appearance: alert, no distress, WD/WN, male HEENT: normocephalic, sclerae anicteric, TMs pearly, nares patent, no discharge or erythema, pharynx normal Oral cavity: MMM, no lesions Neck: supple, no lymphadenopathy, no thyromegaly, no masses Heart: RRR, normal S1, S2, no murmurs Lungs: CTA bilaterally, no wheezes, rhonchi, or rales Abdomen: +bs, obese soft, non tender, non distended, no masses, no hepatomegaly, no splenomegaly Musculoskeletal: nontender, no swelling; obvious bony joint deformity/enlargement of DIP joints of bilateral hands and CMP joints without effusion or heat.  Extremities: no edema, no cyanosis, no clubbing Pulses: 2+ symmetric, upper and lower extremities, normal cap refill Neurological: alert, oriented x 3, CN2-12 intact, strength normal upper extremities and lower extremities, sensation normal throughout, DTRs 2+ throughout, no cerebellar signs, gait normal Psychiatric: normal affect, behavior normal, pleasant     Magda Bernheim, NP   07/27/2019

## 2021-05-09 ENCOUNTER — Ambulatory Visit: Payer: Medicare Other | Admitting: Nurse Practitioner

## 2021-05-09 DIAGNOSIS — L4050 Arthropathic psoriasis, unspecified: Secondary | ICD-10-CM

## 2021-05-09 DIAGNOSIS — E669 Obesity, unspecified: Secondary | ICD-10-CM

## 2021-05-09 DIAGNOSIS — Z79899 Other long term (current) drug therapy: Secondary | ICD-10-CM

## 2021-05-09 DIAGNOSIS — K5090 Crohn's disease, unspecified, without complications: Secondary | ICD-10-CM

## 2021-05-09 DIAGNOSIS — E1122 Type 2 diabetes mellitus with diabetic chronic kidney disease: Secondary | ICD-10-CM

## 2021-05-09 DIAGNOSIS — E1169 Type 2 diabetes mellitus with other specified complication: Secondary | ICD-10-CM

## 2021-05-09 DIAGNOSIS — F5101 Primary insomnia: Secondary | ICD-10-CM

## 2021-05-09 DIAGNOSIS — I10 Essential (primary) hypertension: Secondary | ICD-10-CM

## 2021-05-09 DIAGNOSIS — E1165 Type 2 diabetes mellitus with hyperglycemia: Secondary | ICD-10-CM

## 2021-05-09 DIAGNOSIS — E559 Vitamin D deficiency, unspecified: Secondary | ICD-10-CM

## 2021-05-10 ENCOUNTER — Other Ambulatory Visit: Payer: Self-pay | Admitting: Nurse Practitioner

## 2021-05-10 DIAGNOSIS — R450 Nervousness: Secondary | ICD-10-CM

## 2021-05-14 ENCOUNTER — Other Ambulatory Visit: Payer: Self-pay | Admitting: Adult Health Nurse Practitioner

## 2021-05-14 ENCOUNTER — Other Ambulatory Visit: Payer: Self-pay | Admitting: Adult Health

## 2021-05-14 DIAGNOSIS — E782 Mixed hyperlipidemia: Secondary | ICD-10-CM

## 2021-05-31 ENCOUNTER — Other Ambulatory Visit: Payer: Self-pay

## 2021-05-31 ENCOUNTER — Ambulatory Visit: Payer: Medicare Other | Admitting: Pharmacist

## 2021-05-31 DIAGNOSIS — E1169 Type 2 diabetes mellitus with other specified complication: Secondary | ICD-10-CM | POA: Diagnosis not present

## 2021-05-31 DIAGNOSIS — E669 Obesity, unspecified: Secondary | ICD-10-CM

## 2021-05-31 DIAGNOSIS — R7989 Other specified abnormal findings of blood chemistry: Secondary | ICD-10-CM

## 2021-05-31 DIAGNOSIS — N182 Chronic kidney disease, stage 2 (mild): Secondary | ICD-10-CM

## 2021-05-31 DIAGNOSIS — E559 Vitamin D deficiency, unspecified: Secondary | ICD-10-CM

## 2021-05-31 DIAGNOSIS — E782 Mixed hyperlipidemia: Secondary | ICD-10-CM | POA: Diagnosis not present

## 2021-05-31 DIAGNOSIS — M810 Age-related osteoporosis without current pathological fracture: Secondary | ICD-10-CM

## 2021-05-31 DIAGNOSIS — I10 Essential (primary) hypertension: Secondary | ICD-10-CM

## 2021-05-31 DIAGNOSIS — E785 Hyperlipidemia, unspecified: Secondary | ICD-10-CM | POA: Diagnosis not present

## 2021-05-31 DIAGNOSIS — E1122 Type 2 diabetes mellitus with diabetic chronic kidney disease: Secondary | ICD-10-CM

## 2021-05-31 DIAGNOSIS — Z79899 Other long term (current) drug therapy: Secondary | ICD-10-CM

## 2021-05-31 NOTE — Progress Notes (Signed)
Summary: Melvin [facility] is a friendly 75 year old male who presents for CCM initial visit. He has no chief complaints today but is asking if he can discontinue some of his supplements   Recommendations/Changes made from today's visit: Recommend pt get DEXA scan to confirm osteoporosis Dx Repeat LFTs at next visit and schedule abdominal ultrasound for further testing Recommend starting pt on Mounjaro for blood sugar control and weight loss Pt would like to get flu vaccine and TB test at next visit   [facility] - Patient Visit   BENNY, DEUTSCHMAN [id number]  11 years, Male  DOB: August 26, 1945  M: [phone]  Care Team:Lauren, Robyne Askew, Oriel Rumbold   __________________________________________________  Chronic Conditions  Patient's Chronic Conditions: Hypertension (HTN), Hyperlipidemia/Dyslipidemia (HLD), Diabetes (DM), Chronic Kidney Disease (CKD), Osteopenia or Osteoporosis, Insomnia, Vitamin D deficiency, Chron Disease, Psoriatic arthritis     Doctor and Hospital Visits  Were there PCP Visits in last 6 months?: Yes  Visit #1: 01/24/2021- Dr. Melford Aase (PCP)- [facility] No medication changes.  Were there Specialist Visits in last 6 months?: No  Was there a Hospital Visit in last 30 days?: No  Were there other Hospital Visits in last 6 months?: No    Medication Information  Are there any Medication discrepancies?: No  Are there any Medication adherence gaps (beyond 5 days past due)?: Yes  Details: Amlodipine 55m-12/25/2020-04/04/2021 (>5 day gap) 90DS by [facility]  Medication adherence rates for the STAR rating drugs: Amlodipine 542m 04/04/21 90DS  Rosuvastatin 205m12/10/22 90DS    List Patient's current Care Gaps: Need Eye Exam Documented in EHR or by Claim, Need Colorectal Cancer Screening  Patient scheduled?for?CCM visit with the clinical pharmacist.? Patient is referred for CCM by their PCP and CPP is under general PCP supervision.: At least 2 of these conditions are expected to last 12 months  or longer and patient is at significant risk for acute exacerbations and/or functional decline.  Patient has consented to participation in CCM program.    Pre-Call Questions  Are you able to connect with Patient: Yes  Visit Type: Phone Call  May we confirm what is the best phone # for the pharmacist to call you?: [phone] visit date/time: 05/31/2021 at 3:00pm  Have you seen any other providers since your last visit?: No  Any changes in your medicines or health?: No  Any side effects from any medications?: No  Do you have any symptoms or problems not managed by your medications?: No  What are your top 2 health concerns right now?: Staying healthy, losing some weight  What are your top 2 medication concerns right now?: Other  Details: N/A  Has your Dr asked that you take BP, BG or special diet at home?: Specific diet, Exercise  Details: vegan.  Do you get any type of exercise on a regular basis?: Yes  What type of exercise and how often?: Pt. stated he does alot of walking daily and taking care/playing with young dogs.  What are your top 1 or 2 goals for your health?: Pt. stated "not have any health issues"  What are you doing to try to reach these goals?: Exercise, Diet, Taking medications, Monitoring weight  What, if any, problems do you have getting your medications from the pharmacy?: None  Is there anything else that you would like to discuss during the appointment?: No    Disease Assessments   Subjective Information Current BP: 134/84  Current HR: 77  taken on: 01/23/2021  Previous BP:  126/80  Previous HR: 84  taken on: 08/21/2020  Weight: 201  BMI: 29.77  Last GFR: 99  taken on: 01/23/2021  Why did the patient present?: CCM initial visit  Marital status?: Married  Details: Olin Hauser  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 On days he is home  he gets up early and go outside if not too cold.  He plays with a few dogs and talks on the phone and get some exercise around the yard. Where he lives there is a long driveway to the street, so he walks 1-2 times a day up and down the driveway. He plays with frisbee and throws them to the dogs. When on the road he is not really active. He also is served free food which is not too healthy. Since he came back from his last music gig in [place] December, he gained 7 pounds... His next gig is early January.  Who does the patient spend their time with and what do they do?: Patient is married to wife and spends time with her when home. he spends time with band members  Lifestyle habits such as diet and exercise?: Diet: Vegetarian but states he eats a high volume of food.  Exercise: see above  Alcohol,?tobacco,?and illicit drug usage?: Occasional drinker  Never smoker  Illicit drug usage: 0  What is the patient's sleep pattern?: No sleep issues, Other (with free form text)  Details: Alprazolam (only if really needed) and Trazodone (4 out of 5 nights) This has helped manage insomnia  How many hours per night does patient typically sleep?: 6-8hours  Patient pleased with health care?they are?receiving?: Yes  Family, occupational,?and living circumstances relevant to?overall health?: His family history includes Diabetes in his father; Heart disease in his father; Hypertension in his father; Lymphoma in his mother; Prostate cancer in his father.  Name and location of Current pharmacy: [facility] [place] - [address] AT [place] (Ph: [phone])  Current Rx insurance plan: UHC  Are meds synced by current pharmacy?: No  Are meds delivered by current pharmacy?: No - delivery not available  Would patient benefit from direct intervention of clinical lead in dispensing process to optimize clinical outcomes?: Yes  Are [facility] pharmacy services available  where patient lives?: Yes  Is patient disadvantaged to use [facility]?: No  [facility] services reviewed with patient and patient wishes to change pharmacy?: No  Select reason patient declined to change pharmacies: Other  Details: Forgot to mention [facility], please mention at next visit  Does patient experience delays in picking up medications due to transportation concerns (getting to pharmacy)?: No  Any additional demeanor/mood notes?: Isaid is a friendly 75 year old male who presents for CCM initial visit. He has no chief complaints today but is asking if he can discontinue some of his supplements    Hypertension (HTN)  Assess this condition today?: Yes  Is patient able to obtain BP reading today?: No  Goal: <130/80 mmHG  Hypertension Stage: Stage 1 (SBP: 130-139?or?DBP: 80-89)  Is Patient checking BP at home?: Yes  Pt. home BP readings are ranging: Does not have a BP monitor  How often does patient miss taking their blood pressure medications?: Never  Has patient experienced hypotension, dizziness, falls or bradycardia?: No  Check present secondary causes (  below) for HTN: Obesity, CKD  BP RPM device: Does patient qualify?: Yes  We discussed: Reducing the amount of salt intake to <1565m/per day., Recommend using a salt substitute to replace your salt if you need flavor., Targeting 150 minutes of aerobic activity per week, Weight reduction- We discussed losing 5-10% of body weight.  Assessment:: Controlled  Drug: Amlodipine 531mQD  Assessment: Appropriate, Query Effectiveness  HC Follow up: N/A  Pharmacist Follow up: Consider patient for RPM when available    Hyperlipidemia/Dyslipidemia (HLD)  Last Lipid panel on: 01/23/2021  TC (Goal<200): 128  LDL: 59  HDL (Goal>40): 43  TG (Goal<150): 193  ASCVD 10-year risk?is:: N/A due to lab values outside the range  Assess this condition today?: Yes  LDL Goal: <100  Has patient tried and failed any HLD  Medications?: No  Check present secondary causes (below) that can lead to increased cholesterol levels (multi-choice optional): Diabetes, CKD  We discussed: How a diet high in plant sterols (fruits/vegetables/nuts/whole grains/legumes) may reduce your cholesterol., Encouraged increasing fiber to a daily intake of 10-25g/day, How to reduce cholesterol through diet/weight management and physical activity.  Assessment:: Uncontrolled  Drug: Rosuvastatin 2055mD  Assessment: Appropriate, Query Effectiveness  Drug: Fenofibrate 160m54m  Assessment: Appropriate, Query Effectiveness  Additional Info: AST - 52  ALT - 48  Pt has had persistent elevation in LFTs, abdominal ultrasound was ordered but patient did not complete. Pt would like to recheck labs at next visit before completing ultrasound. Pt would like to continue statin meds  HC Follow up: January: Lipid assessment. Review LFTs (AST and ALT) and review Lipid panel  Pharmacist Follow up: Review lipid panel for decrease in TG. Review LFTs and s/s of rhabdo    Diabetes (DM)  Current A1C: 6.7  taken on: 01/23/2021  Previous A1C: 8.7  taken on: 08/21/2020  Type: 2  The current microalbumin ratio is: 226  tested on: 01/23/2021  Last DM Foot Exam on: 01/23/2021  Results: normal  Results: Unknown   Results: Unknown  Assess this condition today?: Yes  Goal A1C: < 7.0 %  Type: 2  Is Patient taking statin medication?: Yes  Is patient taking ACEi / ARB?: No  Reason patient is not taking ACEi / ARB: BP controlled on current regimen  DM RPM device: Does patient qualify?: Yes  Patient checking Blood Sugar at home?: No  Has patient experienced any hypoglycemic episodes?: No  Diet recall discussed: No  We discussed: Modifying lifestyle, including to participate in moderate physical activity (e.g., walking) at least 150 minutes per week., Low carbohydrate eating plan with an emphasis on whole grains, legumes, nuts, fruits, and vegetables and minimal  refined and processed foods.  Assessment:: Controlled  Drug: Metformin XR 500mg82mabs BID  Assessment: Appropriate, Effective, Safe, Accessible  Plan to (other): Reach out to provider to recommend starting Mounjaro 2.5mg w40mly for blood sugar control and weight loss  HC Follow up: N/A  Pharmacist Follow up: Assess A1c and FBG    Osteoporosis  Current T-score: None Available  Previous T-score: Not available   Current Vitamin D 25-OH: 61  taken on: 01/23/2021  Previous Vitamin D 25-OH: 80  taken on: 06/12/2020  Assess this condition today?: Yes  In the past 12 months, have you fallen?: No  Are there any stairs in or around the home?: Yes  Are there any stairs with handrails?: Yes  Is the home free of loose throw rugs in walkways, pet beds, electrical cords, etc.?:  Yes  Is there adequate lighting in your home to reduce the risk of falls?: Yes  Dietary calcium intake: Patient on Supplement  Risk Factors: Alcohol Use  We discussed: Weight bearing exercises (walking, light weights, resistance training), Fall prevention, Dietary calcium intake  Assessment:: Controlled  Drug: Vitamin D 10000 units daily  Assessment: Appropriate, Effective, Safe, Accessible  Drug: Calcium Carbonate 653m Daily  Assessment: Appropriate, Effective, Safe, Accessible  Plan to Order: Recommend patient complete repeat DEXA scan to confirm Osteoporosis Dx  HC Follow up: N/A  Pharmacist Follow up: Follow up with fall risk, DEXA scan, VitD level    Exercise, Diet and Non-Drug Coordination Needs  Additional exercise counseling points. We discussed: incorporating flexibility, balance, and strength training exercises, targeting at least 150 minutes per week of moderate-intensity aerobic exercise., aiming to lose 5 to 10% of body weight through lifestyle modifications  Additional diet counseling points. We discussed: key components of the DASH diet, key components of a low-carb eating plan  Discussed Non-Drug Care  Coordination Needs: Yes  Does Patient have Medication financial barriers?: No   Accountable Health Communities Health-Related Social Needs Screening Tool -   SDOH   [email or web address]  What is your living situation today? (ref #1): I have a steady place to live  Think about the place you live. Do you have problems with any of the following? (ref #2): None of the above  Within the past 12 months, you worried that your food would run out before you got money to buy more (ref #3): Never true  Within the past 12 months, the food you bought just didn't last and you didn't have money to get more (ref #4): Never true  In the past 12 months, has lack of reliable transportation kept you from medical appointments, meetings, work or from getting things needed for daily living? (ref #5): Yes  In the past 12 months, has the electric, gas, oil, or water company threatened to shut off services in your home? (ref #6): No  How often does anyone, including family and friends, physically hurt you? (ref #7): Never (1)  How often does anyone, including family and friends, insult or talk down to you? (ref #8): Never (1)  How often does anyone, including friends and family, threaten you with harm? (ref #9): Never (1)  How often does anyone, including family and friends, scream or curse at you? (ref #10): Never (1)   Engagement Notes  ANewton Piggon 05/31/2021 02:42 PM  CPP Chart Review: 22 min  CPP Office Visit: 560min  CPP Office Visit Documentation: 49 min  CPP Coordination of Care: 6 min  HC Care Plan Completion:18 min  CPP Care Plan Review: 16 min     Patient Name:?Taten Hightower  DOB:??512/16/1947 ?  Last Care Plan Update:? 05/31/2021 COMPREHENSIVE CARE PLAN AND GOALS?    HYPERTENSION   MOST RECENT BLOOD PRESSURE:      134/84 mmHg MY GOAL BLOOD PRESSURE:   <130/80  CURRENT MEDICATION AND DOSING:   Amlodipine 539mdaily THE GOALS WE HAVE  CHOSEN ARE:     -Maintain an at goal blood pressure   BARRIERS TO ACHIEVING GOALS:   -Controlled  PLAN TO WORK ON THESE GOALS:   -Take medications regularly    -Check BP at home   -Reduce salt intake (< 150052mday)   -Diet: DASH diet (Choose fruits, vegetables, and low-fat dairy products. Increase whole grains, fish, poultry, nuts. Reduce red meats and sugars)   -Weight:  1 kg = ~1 mmHg reduction   -Exercise   CHOLESTEROL   MOST RECENT LABS:  01/23/2021    -TOTAL CHOLESTEROL: 128  -TRIGLYCERIDES: 193  -HDL: 43  -LDL: 59  CURRENT MEDICATION AND DOSING:   Rosuvastatin 57m daily, Fenofibrate 1632mdaily  THE GOALS WE HAVE CHOSEN ARE:     -Total Cholesterol goal under 200, Triglycerides goal under 150, HDL goal above 40, LDL goal under 100   BARRIERS TO ACHIEVING GOALS:   -Uncontrolled  PLAN TO WORK ON THESE GOALS:   -Take medication regularly   -Diet: high in plant sterols (fruits/ vegetables/ nuts/ whole grains/ legumes). Increase fiber intake (10-25g/day). Avoid foods high in cholesterol (red meat, egg yolks, dairy, oils/ butter). Choose low-fat options.   -Exercise   -Weight Management    DIABETES   MOST RECENT A1C:   6.7 % MY GOAL A1C:   <7.0%  LAST FOOT EXAM:  01/23/2021  LAST EYE EXAM:  Unknown  CURRENT MEDICATION AND DOSING:  Metformin XR 50073m tabs 2 times a day THE GOALS WE HAVE CHOSEN ARE:    -Maintain an at goal A1C level   BARRIERS TO ACHIEVING GOALS:   -Controlled  PLAN TO WORK ON THESE GOALS:   -Take medications as prescribed   -Check blood glucose     -Diet: natural carbs and sugars (vegetables, fruits, whole grains, legumes, dairy), avoid alcohol, reduce carbohydrate intake and processed sugars. Maintain a low carbohydrate diet, eating 45 grams of carbohydrates per meal (3 servings of carbohydrates per meal), 15 grams of carbohydrates per snack (1 serving of carbohydrate per snack)   -Weight Loss: waist circumference goal < 35 inches for females; < 40  inches for males   -Exercise: 150 minutes of moderate-intensity aerobic activity per week (at least 3 days)     -Foot care: wash, dry examine feet daily. Moisturize the top and bottom (not in between). Trim nails with nail file (no sharp edges). Wear socks and shoes. Elevate feet when sitting. Annual foot exam by podiatrist.     -Recognize sign and symptoms of hypoglycemia and understand treatment as outlined below   -Hypoglycemia sign and symptoms: dizziness, anxiety/ irritability, shakiness, headache, diaphoresis, hunger, confusion, nausea, ataxia, tremors, palpitations/ tachycardia, blurred vision   -Hypoglycemia Treatment: Rule of 15:    (1) 15-20 grams of glucose/ simple carb (4 oz juice, 8oz milk, 4oz regular soda, 1 tablespoon sugar/ honey/ corn syrup, 3-4 glucose tabs/ 1 serving glucose gel)   (2) Recheck BG after 15 mins. If hypoglycemia continues   (3) Repeat steps 1&2, when BG normalizes, eat small meal or snack    Osteopenia CURRENT REGIMEN AND DOSING:  Vitamin D 10000 units daily, Calcium Carbonate 600m47mily  THE GOALS WE HAVE CHOSEN ARE:   -Reduce fall risk   -Strengthen muscles and bones   PLAN TO WORK ON THESE GOALS:     -Assess home for fall risks, use handrails on stairs and safety bars in bathroom, non-skid mats for the bath tub/ shower   -Exercise: regular weight bearing exercises (walking, jogging) and muscle strengthening (weight training, yoga)  ?    Chronic Kidney Disease   Labs: 01/23/2021  GFR: 92  Microalbumin ratio: 226  PLAN TO WORK ON THESE GOALS:     -Maintaining normal BP and BG   -Diet: Decrease salt and fatty foods. Increase fruit and vegetables     ?  ACTIVE MEDICATION LIST   Your current medication list has been updated. To view,  log in to your patient portal.   Call if any changes need to be made.      MEDICATION REVIEW   MEDICATION REVIEW CONDUCTED:   Yes    DATE:     05/31/2021 BEST POSSIBLE MEDICATION HISTORY  SOURCE:   Medical  Records  ??    HOW DO I? - WHEN DO I?     GET AHOLD OF MY DOCTOR?    DURING BUSINESS HOURS WHEN THE OFFICE IS OPEN    PHONE: [phone]  AFTER HOURS [facility] NURSE WHEN THE OFFICE IS CLOSED   PHONE: [phone]   TALK TO [place] CARE COORDINATOR NAME: Newton Pigg   PHONE: [phone]  EMAIL:  [email or web address]  CARE COORDINATOR STAFF   NAME: Vanetta Shawl  PHONE: [phone]    NOTE SECTION   Thank you for participating in the Chronic Care Management (CCM) program with Dr. Newton Pigg     This program takes a proactive approach to your health and my team will serve as a resource for you throughout the year. Please follow up at [phone] if you have any questions or experience changes to your overall health. Your next CCM appointment will be conducted with Newton Pigg, PharmD as follows:      Date:  10/16/2021   Time:  3:00PM  CCM follow-up Over the Phone     Rachelle Hora. Jeannett Senior, PharmD  Clinical Pharmacist  [email or web address]  [phone]

## 2021-06-01 ENCOUNTER — Encounter: Payer: Self-pay | Admitting: Internal Medicine

## 2021-06-02 ENCOUNTER — Other Ambulatory Visit: Payer: Self-pay | Admitting: Internal Medicine

## 2021-06-02 MED ORDER — TIRZEPATIDE 2.5 MG/0.5ML ~~LOC~~ SOAJ: 2.5000 mg | SUBCUTANEOUS | 0 refills | Status: DC

## 2021-06-12 NOTE — Progress Notes (Signed)
3 MONTH FOLLOW UP Assessment:    Essential hypertension Continue medication; discussed benefit of transition to ACE/ARB, consider at future OV; defer for today per patient  Monitor blood pressure at home; call if consistently over 130/80 Continue DASH diet.   Reminder to go to the ER if any CP, SOB, nausea, dizziness, severe HA, changes vision/speech, left arm numbness and tingling and jaw pain.  Hyperlipidemia associated with T2DM (HCC) Continue medications - rosuvastatin, fenofibrate Discussed LDL goal <70, increase rosuvastatin if above goal, diet discussed Continue low cholesterol diet and exercise.  Check lipid panel.  -     Lipid panel  Type 2 diabetes mellitus with stage 2 chronic kidney disease, without long-term current use of insulin (HCC) Now on metformin 2000 mg daily and tolerating Continue Mounjaro- increased dose to 5 mg qw Discussed dietary and exercise modifications -     Hemoglobin A1c  CKD II associated with T2DM ([place]) Has been stable for many years;  Increase fluids, avoid NSAIDS, monitor sugars, will monitor  Psoriatic arthritis (HCC) Followed by Dr. Lyman Speller Risankizumab injections Q12 weeks  Vitamin D deficiency At goal at recent check; continue to recommend supplementation for goal of 60-100 Defer vitamin D level  Obesity (BMI 30+) Long discussion about weight loss, diet, and exercise Recommended diet heavy in fruits and veggies and low in animal meats, cheeses, and dairy products, appropriate calorie intake Discussed appropriate weight for height, goal of <190 lb set Increase fiber, add walking  Follow up at next visit  Elevated LFT Check CMP Limit Tylenol use, focus on weight loss  Primary insomnia   Discussed good sleep hygiene, decrease stimulation prior to sleep Increase day time activity, Avoid caffeine in evenings Continue trazodone with benefit, doing well limiting benzo    Crohn's disease without complication, unspecified  gastrointestinal tract location [facility]) Doing well at this time, continue follow up Dr. Lyman Speller  Medication management Continued  Flu vaccine need High dose flu vaccine given      Over 30 minutes of exam, counseling, chart review, and critical decision making was performed  Future Appointments  Date Time Provider [place]  08/22/2021  2:30 PM Liane Comber, NP [facility] None  10/16/2021  3:00 PM Newton Pigg, [place] [facility] None  01/24/2022  3:00 PM Unk Pinto, MD [facility] None     Subjective:  Melvin Shelton is a 76 y.o. male who presents for 3 month follow up for HTN, hyperlipidemia, T2DM with CKD II, and vitamin D Def.   [name] Patient has long standing [place] Psoriasis / Psoriatic Arthritis in remission on Cosentyx, but 07/2017 had Colonoscopy by Dr Loletha Carrow with  Bx (+) Crohn's in the TI, so Cosentyx was discontinued. He is taking Risankizumab injections Q12 weeks. And follows with Dr Lyman Speller. He does need a TB test and is planning to do through the dermatologist.   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  He has insomnia, taking trazodone, xanax 0.5 mg, takes 1-2 days per week. Typical week he will do 5 days of Trazodone and 2 days of Xanax  BMI is Body mass index is 29.74 kg/m., he has been working on diet and exercise, trying to watch portions, but still admits could do better.  Planning to restart walking now that weather is improved, pushing water intake  Wt Readings from Last 3 Encounters:  06/14/21 201 lb 6.4 oz (91.4 kg)  01/24/21 201 lb 9.6 oz (91.4 kg)  08/22/20  205 lb (93 kg)   He does not check BPs at home, today their BP is BP: 140/72 BP Readings from Last 3 Encounters:  06/14/21 140/72  01/24/21 134/84  08/22/20 126/80     He does not workout. He denies chest pain, shortness of breath, dizziness.   He is on cholesterol medication (rosuvastatin 20 mg daily, fenofibrate 160 mg  daily) and denies myalgias. His cholesterol is not at goal. The cholesterol last visit was:   Lab Results  Component Value Date   CHOL 128 01/24/2021   HDL 43 01/24/2021   LDLCALC 59 01/24/2021   TRIG 193 (H) 01/24/2021   CHOLHDL 3.0 01/24/2021    He has been working on diet and exercise for T2DM with CKD II, lifestyle controlled for several years until Feb 2021 7+ and started on metformin 500 mg BID, recently increased to 1000 mg BID and tolerating well, and denies increased appetite, nausea, paresthesia of the feet, polydipsia, polyuria, visual disturbances and vomiting. Doesn't have glucometer, prefers to not start checking if possible.  Started Mounjaro and is on the first month- no side effects and has not noticed any decreased appetite Last A1C in the office was:  Lab Results  Component Value Date   HGBA1C 6.7 (H) 01/24/2021    He has CKD II associated with T2DM monitored at this office  Lab Results  Component Value Date   [facility] 85 08/22/2020   Patient is on Vitamin D supplement.   Lab Results  Component Value Date   VD25OH 61 01/24/2021        Medication Review: Current Outpatient Medications on File Prior to Visit  Medication Sig Dispense Refill   ALPRAZolam (XANAX) 0.5 MG tablet TAKE 1/2 TO 1 TABLET BY MOUTH EVERY NIGHT AT BEDTIME ONLY IF NEEDED FOR SLEEP AND LIMIT TO 5 DAYS A WEEK TO AVOID ADDICTION AND DEMENTIA 30 tablet 0   amLODipine (NORVASC) 5 MG tablet TAKE 1 TABLET BY MOUTH DAILY FOR BLOOD PRESSURE 90 tablet 0   aspirin 81 MG tablet Take 81 mg by mouth daily.     Calcium Carbonate (CALCIUM 600 PO) Take 600 mg by mouth daily.     fenofibrate 160 MG tablet TAKE 1 TABLET BY MOUTH DAILY FOR TRIGLYCERIDES( BLOOD FATS) 90 tablet 1   Magnesium 500 MG CAPS Take 500 mg by mouth daily.     metFORMIN (GLUCOPHAGE-XR) 500 MG 24 hr tablet TAKE 2 TABLETS BY MOUTH TWICE DAILY WITH MEALS FOR DIABETES 360 tablet 3   Omega-3 Fatty Acids (FISH OIL) 1200 MG CAPS Take by mouth  daily.      OVER THE COUNTER MEDICATION Vitamin C. 500 mg. One tablet daily.     Risankizumab-rzaa,150 MG Dose, 75 MG/0.83ML PSKT Inject into the skin. Inject every 12 weeks     tirzepatide [facility]) 2.5 MG/0.5ML Pen Inject 2.5 mg into the skin once a week. 2 mL 0   traZODone (DESYREL) 150 MG tablet Take  1 tablet  1 hour  before Bedtime  as needed for Sleep 90 tablet 3   VITAMIN D PO Take 10,000 Units by mouth daily.     amoxicillin-clavulanate (AUGMENTIN) 875-125 MG tablet Take      1 tablet     2 x /day      with Food for Dental Infection (Patient not taking: Reported on 06/14/2021) 30 tablet 3   rosuvastatin (CRESTOR) 20 MG tablet TAKE 1 TABLET(20 MG) BY MOUTH AT BEDTIME 90 tablet 3   No current  facility-administered medications on file prior to visit.    Allergies: Not on File  Current Problems (verified) has Hypertension; Psoriatic arthritis ([place]); Hyperlipidemia associated with type 2 diabetes mellitus ([place]); Osteoporosis; Elevated LFTs; Hx of colonic polyp; Medication management; Vitamin D deficiency; Encounter for Medicare annual wellness exam; Multiple atypical nevi; Crohn disease ([place]); Obesity (BMI 30.0-34.9); T2DM (type 2 diabetes mellitus) ([place]); CKD stage 2 due to type 2 diabetes mellitus ([place]); and Insomnia on their problem list.   Surgical: He  has a past surgical history that includes Colonoscopy; Polypectomy; and Tonsillectomy. Family His family history includes Diabetes in his father; Heart disease in his father; Hypertension in his father; Lymphoma in his mother; Prostate cancer in his father. Social history  He reports that he has never smoked. He has never used smokeless tobacco. He reports current alcohol use. He reports that he does not use drugs.   Review of Systems  Constitutional:  Negative for malaise/fatigue and weight loss.  HENT:  Negative for hearing loss and tinnitus.   Eyes:  Negative for blurred vision and double vision.  Respiratory:  Negative for  cough, shortness of breath and wheezing.   Cardiovascular:  Negative for chest pain, palpitations, orthopnea, claudication and leg swelling.  Gastrointestinal:  Negative for abdominal pain, blood in stool, constipation, diarrhea, heartburn, melena, nausea and vomiting.  Genitourinary: Negative.   Musculoskeletal:  Negative for joint pain and myalgias.  Skin:  Negative for rash.  Neurological:  Negative for dizziness, tingling, sensory change, weakness and headaches.  Endo/Heme/Allergies:  Negative for polydipsia.  Psychiatric/Behavioral: Negative.    All other systems reviewed and are negative.    Objective:   Today's Vitals   06/14/21 1425  BP: 140/72  Pulse: 95  Temp: 97.7 F (36.5 C)  SpO2: 94%  Weight: 201 lb 6.4 oz (91.4 kg)   Body mass index is 29.74 kg/m.  Wt Readings from Last 3 Encounters:  06/14/21 201 lb 6.4 oz (91.4 kg)  01/24/21 201 lb 9.6 oz (91.4 kg)  08/22/20 205 lb (93 kg)    General appearance: alert, no distress, WD/WN, male HEENT: normocephalic, sclerae anicteric, TMs pearly, nares patent, no discharge or erythema, pharynx normal Oral cavity: MMM, no lesions Neck: supple, no lymphadenopathy, no thyromegaly, no masses Heart: RRR, normal S1, S2, no murmurs Lungs: CTA bilaterally, no wheezes, rhonchi, or rales Abdomen: +bs, obese soft, non tender, non distended, no masses, no hepatomegaly, no splenomegaly Musculoskeletal: nontender, no swelling; obvious bony joint deformity/enlargement of DIP joints of bilateral hands and CMP joints without effusion or heat.  Extremities: no edema, no cyanosis, no clubbing Pulses: 2+ symmetric, upper and lower extremities, normal cap refill Neurological: alert, oriented x 3, CN2-12 intact, strength normal upper extremities and lower extremities, sensation normal throughout, DTRs 2+ throughout, no cerebellar signs, gait normal Psychiatric: normal affect, behavior normal, pleasant     Magda Bernheim, NP   07/27/2019

## 2021-06-14 ENCOUNTER — Other Ambulatory Visit: Payer: Self-pay

## 2021-06-14 ENCOUNTER — Ambulatory Visit (INDEPENDENT_AMBULATORY_CARE_PROVIDER_SITE_OTHER): Payer: Medicare Other | Admitting: Nurse Practitioner

## 2021-06-14 ENCOUNTER — Encounter: Payer: Self-pay | Admitting: Nurse Practitioner

## 2021-06-14 VITALS — BP 140/72 | HR 95 | Temp 97.7°F | Wt 201.4 lb

## 2021-06-14 DIAGNOSIS — E1165 Type 2 diabetes mellitus with hyperglycemia: Secondary | ICD-10-CM | POA: Diagnosis not present

## 2021-06-14 DIAGNOSIS — E1122 Type 2 diabetes mellitus with diabetic chronic kidney disease: Secondary | ICD-10-CM

## 2021-06-14 DIAGNOSIS — I10 Essential (primary) hypertension: Secondary | ICD-10-CM | POA: Diagnosis not present

## 2021-06-14 DIAGNOSIS — R7989 Other specified abnormal findings of blood chemistry: Secondary | ICD-10-CM | POA: Diagnosis not present

## 2021-06-14 DIAGNOSIS — Z111 Encounter for screening for respiratory tuberculosis: Secondary | ICD-10-CM

## 2021-06-14 DIAGNOSIS — Z23 Encounter for immunization: Secondary | ICD-10-CM

## 2021-06-14 DIAGNOSIS — E669 Obesity, unspecified: Secondary | ICD-10-CM

## 2021-06-14 DIAGNOSIS — E785 Hyperlipidemia, unspecified: Secondary | ICD-10-CM

## 2021-06-14 DIAGNOSIS — E559 Vitamin D deficiency, unspecified: Secondary | ICD-10-CM | POA: Diagnosis not present

## 2021-06-14 DIAGNOSIS — K5090 Crohn's disease, unspecified, without complications: Secondary | ICD-10-CM

## 2021-06-14 DIAGNOSIS — L4050 Arthropathic psoriasis, unspecified: Secondary | ICD-10-CM

## 2021-06-14 DIAGNOSIS — N182 Chronic kidney disease, stage 2 (mild): Secondary | ICD-10-CM

## 2021-06-14 DIAGNOSIS — G4700 Insomnia, unspecified: Secondary | ICD-10-CM | POA: Diagnosis not present

## 2021-06-14 DIAGNOSIS — Z79899 Other long term (current) drug therapy: Secondary | ICD-10-CM | POA: Diagnosis not present

## 2021-06-14 DIAGNOSIS — E663 Overweight: Secondary | ICD-10-CM

## 2021-06-14 DIAGNOSIS — E1169 Type 2 diabetes mellitus with other specified complication: Secondary | ICD-10-CM

## 2021-06-14 MED ORDER — MOUNJARO 5 MG/0.5ML ~~LOC~~ SOAJ: 5.0000 mg | SUBCUTANEOUS | 3 refills | Status: DC

## 2021-06-15 LAB — CBC WITH DIFFERENTIAL/PLATELET
Absolute Monocytes: 640 cells/uL (ref 200–950)
Basophils Absolute: 73 cells/uL (ref 0–200)
Basophils Relative: 0.9 %
Eosinophils Absolute: 122 cells/uL (ref 15–500)
Eosinophils Relative: 1.5 %
HCT: 47.7 % (ref 38.5–50.0)
Hemoglobin: 16.8 g/dL (ref 13.2–17.1)
Lymphs Abs: 2260 cells/uL (ref 850–3900)
MCH: 32.1 pg (ref 27.0–33.0)
MCHC: 35.2 g/dL (ref 32.0–36.0)
MCV: 91.2 fL (ref 80.0–100.0)
MPV: 10.3 fL (ref 7.5–12.5)
Monocytes Relative: 7.9 %
Neutro Abs: 5006 cells/uL (ref 1500–7800)
Neutrophils Relative %: 61.8 %
Platelets: 347 10*3/uL (ref 140–400)
RBC: 5.23 10*6/uL (ref 4.20–5.80)
RDW: 13.1 % (ref 11.0–15.0)
Total Lymphocyte: 27.9 %
WBC: 8.1 10*3/uL (ref 3.8–10.8)

## 2021-06-15 LAB — HEMOGLOBIN A1C
Hgb A1c MFr Bld: 6.8 % of total Hgb — ABNORMAL HIGH (ref <5.7)
Mean Plasma Glucose: 148 mg/dL
eAG (mmol/L): 8.2 mmol/L

## 2021-06-15 LAB — LIPID PANEL
Cholesterol: 127 mg/dL (ref <200)
HDL: 41 mg/dL (ref >=40)
LDL Cholesterol (Calc): 53 mg/dL (calc)
Non-HDL Cholesterol (Calc): 86 mg/dL (calc) (ref <130)
Total CHOL/HDL Ratio: 3.1 (calc) (ref <5.0)
Triglycerides: 283 mg/dL — ABNORMAL HIGH (ref <150)

## 2021-06-15 LAB — COMPLETE METABOLIC PANEL WITH GFR
AG Ratio: 1.4 (calc) (ref 1.0–2.5)
ALT: 58 U/L — ABNORMAL HIGH (ref 9–46)
AST: 50 U/L — ABNORMAL HIGH (ref 10–35)
Albumin: 4.6 g/dL (ref 3.6–5.1)
Alkaline phosphatase (APISO): 38 U/L (ref 35–144)
BUN: 13 mg/dL (ref 7–25)
CO2: 29 mmol/L (ref 20–32)
Calcium: 10.1 mg/dL (ref 8.6–10.3)
Chloride: 101 mmol/L (ref 98–110)
Creat: 0.92 mg/dL (ref 0.70–1.28)
Globulin: 3.2 g/dL (calc) (ref 1.9–3.7)
Glucose, Bld: 141 mg/dL — ABNORMAL HIGH (ref 65–99)
Potassium: 4.1 mmol/L (ref 3.5–5.3)
Sodium: 137 mmol/L (ref 135–146)
Total Bilirubin: 0.9 mg/dL (ref 0.2–1.2)
Total Protein: 7.8 g/dL (ref 6.1–8.1)
eGFR: 88 mL/min/{1.73_m2} (ref >=60)

## 2021-07-03 ENCOUNTER — Other Ambulatory Visit: Payer: Self-pay | Admitting: Internal Medicine

## 2021-07-11 DIAGNOSIS — Z5181 Encounter for therapeutic drug level monitoring: Secondary | ICD-10-CM | POA: Diagnosis not present

## 2021-07-11 DIAGNOSIS — L409 Psoriasis, unspecified: Secondary | ICD-10-CM | POA: Diagnosis not present

## 2021-07-16 ENCOUNTER — Telehealth: Payer: Self-pay

## 2021-07-16 NOTE — Telephone Encounter (Signed)
Hyperlipidemia/Dyslipidemia Review (HC)  Lipid Panel Date: 06/14/2021 TC: 127 LDL: 53 HDL: 41 TG: 283 Are TC > 200, LDL >100, HDL < 40, TG > 150?: No What recent interventions have been made by any provider to improve the patient's conditions in the last 3 months?: 06/14/21-Dana Mull, NP-Pt. presented for 3 month f/u.   MODIFIED Tirzepatide 2.5 mg Subcutaneous Weekly TO 5 mg Subcutaneous Weekly Any recent hospitalizations or ED visits since last visit with CPP?: No  Adherence rates for STAR metric medications: Tirzepatide 43m/0.69ml / 28DS / 06/02/21 Rosuvastatin 239m/ 90DS / 02/07/21 & 05/12/21 Amlodipine 69m47m 90DS / 12/25/20 & 04/04/21 Metformin 500m29m90DS / 02/07/21 & 05/12/21 Adherence rates for medications indicated for disease state being reviewed: Rosuvastatin 20mg62m0DS / 02/07/21 & 05/12/21 Amlodipine 69mg /10mDS / 12/25/20 & 04/04/21 Does the patient have >5 day gap between last estimated fill dates for any of the above medications?: Yes Reasons for medication gaps: Unknown   Able to connect with the Patient?: Yes Are you having any side effects from your cholesterol medications?: No What diet changes have you made to improve your Cholesterol? ([place]): limiting processed foods, other Details: trying to "portion control" What exercise are you doing to improve your Cholesterol? ([place]): Walking  [facility]: [facility]

## 2021-07-18 DIAGNOSIS — L409 Psoriasis, unspecified: Secondary | ICD-10-CM | POA: Diagnosis not present

## 2021-07-18 DIAGNOSIS — Z5181 Encounter for therapeutic drug level monitoring: Secondary | ICD-10-CM | POA: Diagnosis not present

## 2021-07-19 ENCOUNTER — Other Ambulatory Visit: Payer: Self-pay | Admitting: Nurse Practitioner

## 2021-07-20 ENCOUNTER — Telehealth: Payer: Self-pay

## 2021-07-20 NOTE — Telephone Encounter (Signed)
07/20/21-Called patient for program transfer. Pt. Verbalized agreement and understanding. Encouraged patient to reach out to [place] if he has any new health concerns.  Total time spent: 4 Minutes

## 2021-07-31 ENCOUNTER — Encounter: Payer: Self-pay | Admitting: Nurse Practitioner

## 2021-07-31 ENCOUNTER — Other Ambulatory Visit: Payer: Self-pay | Admitting: Nurse Practitioner

## 2021-07-31 DIAGNOSIS — E1122 Type 2 diabetes mellitus with diabetic chronic kidney disease: Secondary | ICD-10-CM

## 2021-07-31 DIAGNOSIS — N182 Chronic kidney disease, stage 2 (mild): Secondary | ICD-10-CM

## 2021-07-31 MED ORDER — MOUNJARO 7.5 MG/0.5ML ~~LOC~~ SOAJ: 7.5000 mg | SUBCUTANEOUS | 3 refills | Status: DC

## 2021-08-02 ENCOUNTER — Other Ambulatory Visit: Payer: Self-pay | Admitting: Adult Health

## 2021-08-02 DIAGNOSIS — R450 Nervousness: Secondary | ICD-10-CM

## 2021-08-21 ENCOUNTER — Encounter: Payer: Self-pay | Admitting: Nurse Practitioner

## 2021-08-21 ENCOUNTER — Other Ambulatory Visit: Payer: Self-pay | Admitting: Nurse Practitioner

## 2021-08-21 DIAGNOSIS — E782 Mixed hyperlipidemia: Secondary | ICD-10-CM

## 2021-08-21 MED ORDER — ROSUVASTATIN CALCIUM 20 MG PO TABS: ORAL_TABLET | ORAL | 3 refills | Status: DC

## 2021-08-22 ENCOUNTER — Ambulatory Visit (INDEPENDENT_AMBULATORY_CARE_PROVIDER_SITE_OTHER): Payer: Medicare Other | Admitting: Adult Health

## 2021-08-22 ENCOUNTER — Encounter: Payer: Self-pay | Admitting: Adult Health

## 2021-08-22 ENCOUNTER — Other Ambulatory Visit: Payer: Self-pay

## 2021-08-22 VITALS — BP 132/74 | HR 78 | Temp 97.5°F | Wt 191.0 lb

## 2021-08-22 DIAGNOSIS — I10 Essential (primary) hypertension: Secondary | ICD-10-CM

## 2021-08-22 DIAGNOSIS — E785 Hyperlipidemia, unspecified: Secondary | ICD-10-CM

## 2021-08-22 DIAGNOSIS — E1129 Type 2 diabetes mellitus with other diabetic kidney complication: Secondary | ICD-10-CM

## 2021-08-22 DIAGNOSIS — Z1211 Encounter for screening for malignant neoplasm of colon: Secondary | ICD-10-CM

## 2021-08-22 DIAGNOSIS — M810 Age-related osteoporosis without current pathological fracture: Secondary | ICD-10-CM | POA: Diagnosis not present

## 2021-08-22 DIAGNOSIS — Z79899 Other long term (current) drug therapy: Secondary | ICD-10-CM

## 2021-08-22 DIAGNOSIS — E1122 Type 2 diabetes mellitus with diabetic chronic kidney disease: Secondary | ICD-10-CM | POA: Diagnosis not present

## 2021-08-22 DIAGNOSIS — Z0001 Encounter for general adult medical examination with abnormal findings: Secondary | ICD-10-CM | POA: Diagnosis not present

## 2021-08-22 DIAGNOSIS — G4700 Insomnia, unspecified: Secondary | ICD-10-CM

## 2021-08-22 DIAGNOSIS — K760 Fatty (change of) liver, not elsewhere classified: Secondary | ICD-10-CM | POA: Diagnosis not present

## 2021-08-22 DIAGNOSIS — R809 Proteinuria, unspecified: Secondary | ICD-10-CM

## 2021-08-22 DIAGNOSIS — Z860100 Personal history of colon polyps, unspecified: Secondary | ICD-10-CM

## 2021-08-22 DIAGNOSIS — R6889 Other general symptoms and signs: Secondary | ICD-10-CM | POA: Diagnosis not present

## 2021-08-22 DIAGNOSIS — Z0000 Encounter for general adult medical examination without abnormal findings: Secondary | ICD-10-CM

## 2021-08-22 DIAGNOSIS — K5090 Crohn's disease, unspecified, without complications: Secondary | ICD-10-CM | POA: Diagnosis not present

## 2021-08-22 DIAGNOSIS — L4050 Arthropathic psoriasis, unspecified: Secondary | ICD-10-CM

## 2021-08-22 DIAGNOSIS — E1169 Type 2 diabetes mellitus with other specified complication: Secondary | ICD-10-CM | POA: Diagnosis not present

## 2021-08-22 DIAGNOSIS — N182 Chronic kidney disease, stage 2 (mild): Secondary | ICD-10-CM

## 2021-08-22 DIAGNOSIS — E559 Vitamin D deficiency, unspecified: Secondary | ICD-10-CM

## 2021-08-22 DIAGNOSIS — E663 Overweight: Secondary | ICD-10-CM

## 2021-08-22 MED ORDER — LOSARTAN POTASSIUM 50 MG PO TABS: ORAL_TABLET | ORAL | 3 refills | Status: DC

## 2021-08-22 NOTE — Patient Instructions (Addendum)
?Melvin Shelton , ?Thank you for taking time to come for your Medicare Wellness Visit. I appreciate your ongoing commitment to your health goals. Please review the following plan we discussed and let me know if I can assist you in the future.  ? ?These are the goals we discussed: ? Goals   ? ?  Exercise 150 min/wk Moderate Activity   ?  Weight (lb) < 190 lb (86.2 kg)   ? ?  ?  ?This is a list of the screening recommended for you and due dates:  ?Health Maintenance  ?Topic Date Due  ? Eye exam for diabetics  Never done  ? Colon Cancer Screening  05/21/2019  ? COVID-19 Vaccine (4 - Booster for Moderna series) 03/18/2020  ? Zoster (Shingles) Vaccine (1 of 2) 11/22/2021*  ? Hemoglobin A1C  12/12/2021  ? Complete foot exam   01/24/2022  ? Urine Protein Check  01/24/2022  ? Tetanus Vaccine  03/17/2024  ? Pneumonia Vaccine  Completed  ? Flu Shot  Completed  ? Hepatitis C Screening: USPSTF Recommendation to screen - Ages 9-79 yo.  Completed  ? HPV Vaccine  Aged Out  ?*Topic was postponed. The date shown is not the original due date.  ? ? ? ? ?Losartan Tablets ?What is this medication? ?LOSARTAN (loe SAR tan) treats high blood pressure. It may also be used to prevent a stroke in people with heart disease and high blood pressure. It can be used to prevent kidney damage in people with diabetes. It works by relaxing the blood vessels, which helps decrease the amount of work your heart has to do. It belongs to a group of medications called ARBs. ?This medicine may be used for other purposes; ask your health care provider or pharmacist if you have questions. ?COMMON BRAND NAME(S): Cozaar ?What should I tell my care team before I take this medication? ?They need to know if you have any of these conditions: ?Heart failure ?Kidney disease ?Liver disease ?An unusual or allergic reaction to losartan, other medications, foods, dyes, or preservatives ?Pregnant or trying to get pregnant ?Breast-feeding ?How should I use this  medication? ?Take this medication by mouth. Take it as directed on the prescription label at the same time every day. You can take it with or without food. If it upsets your stomach, take it with food. Keep taking it unless your care team tells you to stop. ?Talk to your care team about the use of this medication in children. While it may be prescribed for children as young as 6 for selected conditions, precautions do apply. ?Overdosage: If you think you have taken too much of this medicine contact a poison control center or emergency room at once. ?NOTE: This medicine is only for you. Do not share this medicine with others. ?What if I miss a dose? ?If you miss a dose, take it as soon as you can. If it is almost time for your next dose, take only that dose. Do not take double or extra doses. ?What may interact with this medication? ?Aliskiren ?ACE inhibitors, like enalapril or lisinopril ?Diuretics, especially amiloride, eplerenone, spironolactone, or triamterene ?Lithium ?NSAIDs, medications for pain and inflammation, like ibuprofen or naproxen ?Potassium salts or potassium supplements ?This list may not describe all possible interactions. Give your health care provider a list of all the medicines, herbs, non-prescription drugs, or dietary supplements you use. Also tell them if you smoke, drink alcohol, or use illegal drugs. Some items may interact with your medicine. ?  What should I watch for while using this medication? ?Visit your care team for regular check ups. Check your blood pressure as directed. Ask your care team what your blood pressure should be. Also, find out when you should contact them. ?Do not treat yourself for coughs, colds, or pain while you are using this medication without asking your care team for advice. Some medications may increase your blood pressure. ?Women should inform their care team if they wish to become pregnant or think they might be pregnant. There is a potential for serious side  effects to an unborn child. Talk to your care team for more information. ?You may get drowsy or dizzy. Do not drive, use machinery, or do anything that needs mental alertness until you know how this medication affects you. Do not stand or sit up quickly, especially if you are an older patient. This reduces the risk of dizzy or fainting spells. Alcohol can make you more drowsy and dizzy. Avoid alcoholic drinks. ?Avoid salt substitutes unless you are told otherwise by your care team. ?What side effects may I notice from receiving this medication? ?Side effects that you should report to your care team as soon as possible: ?Allergic reactions--skin rash, itching, hives, swelling of the face, lips, tongue, or throat ?High potassium level--muscle weakness, fast or irregular heartbeat ?Kidney injury--decrease in the amount of urine, swelling of the ankles, hands, or feet ?Low blood pressure--dizziness, feeling faint or lightheaded, blurry vision ?Side effects that usually do not require medical attention (report to your care team if they continue or are bothersome): ?Dizziness ?Headache ?Runny or stuffy nose ?This list may not describe all possible side effects. Call your doctor for medical advice about side effects. You may report side effects to FDA at 1-800-FDA-1088. ?Where should I keep my medication? ?Keep out of the reach of children and pets. ?Store at room temperature between 20 and 25 degrees C (68 and 77 degrees F). Protect from light. Keep the container tightly closed. Get rid of any unused medication after the expiration date. ?To get rid of medications that are no longer needed or have expired: ?Take the medication to a medication take-back program. Check with your pharmacy or law enforcement to find a location. ?If you cannot return the medication, check the label or package insert to see if the medication should be thrown out in the garbage or flushed down the toilet. If you are not sure, ask your care team.  If it is safe to put in the trash, empty the medication out of the container. Mix the medication with cat litter, dirt, coffee grounds, or other unwanted substance. Seal the mixture in a bag or container. Put it in the trash. ?NOTE: This sheet is a summary. It may not cover all possible information. If you have questions about this medicine, talk to your doctor, pharmacist, or health care provider. ?? 2022 Elsevier/Gold Standard (2021-02-06 00:00:00) ? ? ? ? ?

## 2021-08-22 NOTE — Progress Notes (Signed)
MEDICARE ANNUAL WELLNESS VISIT AND FOLLOW UP ? ?Assessment:  ? ?Melvin Shelton was seen today for follow-up and medicare wellness. ? ?Diagnoses and all orders for this visit: ? ?Annual Medicare Wellness Visit ?Due annually  ?Health maintenance reviewed ? ?Essential hypertension ?Stop amlodipine 2.5 mg, start losartan 50 mg - 6 week follow up  ?Monitor blood pressure at home; call if consistently over 130/80 ?Continue DASH diet.   ?Reminder to go to the ER if any CP, SOB, nausea, dizziness, severe HA, changes vision/speech, left arm numbness and tingling and jaw pain. ? ?Mixed hyperlipidemia associated with T2DM ([place]) ?Continue statin for LDL goal <70 ?Continue low cholesterol diet and exercise.  ?Check lipid panel.  ?-     Lipid panel ? ?Type 2 diabetes mellitus with stage 2 chronic kidney disease, without long-term current use of insulin ([place]) ?Education: Reviewed ?ABCs? of diabetes management (respective goals in parentheses):  A1C (<7), blood pressure (<130/80), and cholesterol (LDL <70) ?Eye Exam yearly and Dental Exam every 6 months. ?Dietary recommendations ?Physical Activity recommendations ?Doing well with metformin, mounjaro; he would like to stop metformin if able ?-     Hemoglobin A1c ? ?CKD 2 associated with T2DM ([place]) ?Increase fluids, avoid NSAIDS, monitor sugars, will monitor ?     -     CMP/GFR ? ?Microalbuminuria associated with T2DM ([place]) ?Trending up, stop amlodipine and start losartan after discussion today ?Follow up for BMP/GFR and recheck in 6 weeks ?- microalbumin ? ?Psoriatic arthritis ([place]) ?Followed by Dr. Lyman Speller ?Risankizumab injections Q12 weeks ?Follows with Dr Lyman Speller ? ?Osteoporosis without current pathological fracture, unspecified osteoporosis type ?Continue Vit D and Ca, weight bearing exercises ? ?Vitamin D deficiency ?At goal at recent check; continue to recommend supplementation for goal of 70-100 ?Defer vitamin D level ? ?Hx of colonic polyp ?OVERDUE colonoscopy - GI referral placed   ? ?Overweight -  ?Long discussion about weight loss, diet, and exercise ?Recommended diet heavy in fruits and veggies and low in animal meats, cheeses, and dairy products, appropriate calorie intake ?Discussed appropriate weight for height  ?Follow up at next visit ? ?Primary insomnia ? Discussed good sleep hygiene, decrease stimulation prior to sleep ?Increase day time activity ?Avoid caffeine in evenings ?Continue trazodone with benefit ?  ?Crohn's disease without complication, unspecified gastrointestinal tract location [facility]) ?Doing well at this time ?Previous medication induced.   ?Resolved at last colonoscopy ?Monitor; denies sx at this time ? ?Fatty liver ?Weight loss with low processed carbohydrate diet recommended ?Limit tylenol/alcohol ?Monitor LFT trend and follow up US if trending up ? ?Medication management ?Continued ? ?Orders Placed This Encounter  ?Procedures  ? CBC with Differential/Platelet  ? COMPLETE METABOLIC PANEL WITH GFR  ? Magnesium  ? Lipid panel  ? TSH  ? Hemoglobin A1c  ? Microalbumin / creatinine urine ratio  ? Ambulatory referral to Gastroenterology  ? ?Over 30 minutes of face to face interview, exam, counseling, chart review, and critical decision making was performed. ? ?Future Appointments  ?Date Time Provider [place]  ?10/17/2021  3:00 PM Mull, Townsend Roger, NP [facility] None  ?01/24/2022  3:00 PM Unk Pinto, MD [facility] None  ?08/27/2022  4:00 PM Liane Comber, NP [facility] None  ? ? ?Plan:  ? ?During the course of the visit the patient was educated and counseled about appropriate screening and preventive services including:  ? ?Pneumococcal vaccine                           ?  Influenza vaccine ?Prevnar 13 ?Td vaccine ?Screening electrocardiogram ?Colorectal cancer screening ?Diabetes screening ?Glaucoma screening ?Nutrition counseling  ? ? ?Subjective:  ?Melvin Shelton is a 76 y.o. male who presents for Medicare Annual Wellness Visit and 3 month follow up. has  Hypertension; Psoriatic arthritis ([place]); Hyperlipidemia associated with type 2 diabetes mellitus ([place]); Osteoporosis; Fatty liver; Hx of colonic polyp; Medication management; Vitamin D deficiency; Encounter for Medicare annual wellness exam; Multiple atypical nevi; Crohn disease ([place]); Overweight (BMI 25.0-29.9); T2DM (type 2 diabetes mellitus) ([place]); CKD stage 2 due to type 2 diabetes mellitus ([place]); Insomnia; and Microalbuminuria due to type 2 diabetes mellitus ([place]) on their problem list. ? 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  He is taking Risankizumab injections Q12 weeks, follows with Dr Lyman Speller.  ? ?He is overdue for follow up colonoscopy, last was 05/20/2018 and was recommended for 1 year follow up, overdue due to pandemic, receptive to this ? ?BMI is Body mass index is 28.21 kg/m?., he has been working on diet and exercise. Physically active with his dogs and walking on property.  ?[name].  ?Wt Readings from Last 3 Encounters:  ?08/22/21 191 lb (86.6 kg)  ?06/14/21 201 lb 6.4 oz (91.4 kg)  ?01/24/21 201 lb 9.6 oz (91.4 kg)  ? ?His blood pressure has been controlled at home, today [name] ? He does not workout, is on tour so it makes it difficult.  He denies chest pain, shortness of breath, dizziness. ? ? He is on cholesterol medication, rosuvastatin 71m and fenofibrate 1682mand denies myalgias. His cholesterol is at goal, LDL <70. The cholesterol last visit was:   ?Lab Results  ?Component Value Date  ? CHOL 127 06/14/2021  ? HDL 41 06/14/2021  ? LDUllin3 06/14/2021  ? TRIG 283 (H) 06/14/2021  ? CHOLHDL 3.1 06/14/2021  ? ? He has been working on diet and exercise for DMII with CKD II on metformin and mounjaro 7.5 mg/week, on statin, ASA 81 mg and denies foot ulcerations,  increased appetite, nausea, paresthesia of the feet, polydipsia, polyuria, visual disturbances, vomiting and weight loss. Last A1C in the office was:  ?Lab Results  ?Component Value Date  ? HGBA1C 6.8 (H) 06/14/2021  ? ?CKD II associated with T2DM monitored at this office. Not on ARB, has been on amlodipine 2.5 mg daily for many years. Last GFR:  ?Lab Results  ?Component Value Date  ? GFRNONAA 85 08/22/2020  ? ?Does have microalbuminuria trending up, not currently on ACEi/ARB, receptive to starting-  ?Lab Results  ?Component Value Date  ? MICRALBCREAT 226 (H) 01/24/2021  ? MICRALBCREAT 135 (H) 11/23/2019  ? MICRALBCREAT 138 (H) 09/23/2018  ? ? Patient is on Vitamin D supplement.   ?Lab Results  ?Component Value Date  ? VD25OH 61 01/24/2021  ?   ? ? ?Medication Review: ?Current Outpatient Medications on File Prior to Visit  ?Medication Sig Dispense Refill  ? ALPRAZolam (XANAX) 0.5 MG tablet TAKE 1/2 TO 1 TABLET BY MOUTH EVERY NIGHT AT BEDTIME ONLY IF NEEDED FOR SLEEP, LIMIT TO 5 DAYS A WEEK TO AVOID ADDICTION AND DEMENTIA 30 tablet 0  ? aspirin 81 MG tablet Take 81 mg by mouth daily.    ? Calcium Carbonate (CALCIUM 600 PO) Take 600 mg  by mouth daily.    ? fenofibrate 160 MG tablet TAKE 1 TABLET BY MOUTH DAILY FOR TRIGLYCERIDES( BLOOD FATS) 90 tablet 1  ? Magnesium 500 MG CAPS Take 500 mg by mouth daily.    ? metFORMIN (GLUCOPHAGE-XR) 500 MG 24 hr tablet TAKE 2 TABLETS BY MOUTH TWICE DAILY WITH MEALS FOR DIABETES 360 tablet 3  ? Omega-3 Fatty Acids (FISH OIL) 1200 MG CAPS Take by mouth daily.     ? OVER THE COUNTER MEDICATION Vitamin C. 500 mg. One tablet daily.    ? Risankizumab-rzaa,150 MG Dose, 75 MG/0.83ML PSKT Inject into the skin. Inject every 12 weeks    ? rosuvastatin (CRESTOR) 20 MG tablet TAKE 1 TABLET(20 MG) BY MOUTH AT BEDTIME 90 tablet 3  ? tirzepatide (MOUNJARO) 7.5 MG/0.5ML Pen Inject 7.5 mg into the skin once a week. 2 mL 3  ? traZODone (DESYREL) 150 MG tablet Take  1 tablet  1 hour  before Bedtime  as  needed for Sleep 90 tablet 3  ? VITAMIN D PO Take 10,000 Units by mouth daily.    ? ?No current facility-administered medications on file prior to visit.  ? ? ?Allergies: ?Not on File ? ?Current Problems (verifi

## 2021-08-23 LAB — LIPID PANEL
Cholesterol: 140 mg/dL (ref <200)
HDL: 42 mg/dL (ref >=40)
LDL Cholesterol (Calc): 68 mg/dL (calc)
Non-HDL Cholesterol (Calc): 98 mg/dL (calc) (ref <130)
Total CHOL/HDL Ratio: 3.3 (calc) (ref <5.0)
Triglycerides: 256 mg/dL — ABNORMAL HIGH (ref <150)

## 2021-08-23 LAB — CBC WITH DIFFERENTIAL/PLATELET
Absolute Monocytes: 657 cells/uL (ref 200–950)
Basophils Absolute: 59 cells/uL (ref 0–200)
Basophils Relative: 0.6 %
Eosinophils Absolute: 157 cells/uL (ref 15–500)
Eosinophils Relative: 1.6 %
HCT: 44.2 % (ref 38.5–50.0)
Hemoglobin: 15.7 g/dL (ref 13.2–17.1)
Lymphs Abs: 2136 cells/uL (ref 850–3900)
MCH: 31.8 pg (ref 27.0–33.0)
MCHC: 35.5 g/dL (ref 32.0–36.0)
MCV: 89.7 fL (ref 80.0–100.0)
MPV: 10.7 fL (ref 7.5–12.5)
Monocytes Relative: 6.7 %
Neutro Abs: 6791 cells/uL (ref 1500–7800)
Neutrophils Relative %: 69.3 %
Platelets: 373 10*3/uL (ref 140–400)
RBC: 4.93 10*6/uL (ref 4.20–5.80)
RDW: 13.1 % (ref 11.0–15.0)
Total Lymphocyte: 21.8 %
WBC: 9.8 10*3/uL (ref 3.8–10.8)

## 2021-08-23 LAB — COMPLETE METABOLIC PANEL WITH GFR
AG Ratio: 1.4 (calc) (ref 1.0–2.5)
ALT: 38 U/L (ref 9–46)
AST: 37 U/L — ABNORMAL HIGH (ref 10–35)
Albumin: 4.3 g/dL (ref 3.6–5.1)
Alkaline phosphatase (APISO): 38 U/L (ref 35–144)
BUN: 12 mg/dL (ref 7–25)
CO2: 24 mmol/L (ref 20–32)
Calcium: 9.7 mg/dL (ref 8.6–10.3)
Chloride: 104 mmol/L (ref 98–110)
Creat: 0.97 mg/dL (ref 0.70–1.28)
Globulin: 3 g/dL (calc) (ref 1.9–3.7)
Glucose, Bld: 106 mg/dL — ABNORMAL HIGH (ref 65–99)
Potassium: 4.2 mmol/L (ref 3.5–5.3)
Sodium: 139 mmol/L (ref 135–146)
Total Bilirubin: 1 mg/dL (ref 0.2–1.2)
Total Protein: 7.3 g/dL (ref 6.1–8.1)
eGFR: 82 mL/min/{1.73_m2} (ref >=60)

## 2021-08-23 LAB — MICROALBUMIN / CREATININE URINE RATIO
Creatinine, Urine: 171 mg/dL (ref 20–320)
Microalb Creat Ratio: 133 mcg/mg creat — ABNORMAL HIGH (ref <30)
Microalb, Ur: 22.7 mg/dL

## 2021-08-23 LAB — TSH: TSH: 0.82 mIU/L (ref 0.40–4.50)

## 2021-08-23 LAB — HEMOGLOBIN A1C
Hgb A1c MFr Bld: 5.9 % of total Hgb — ABNORMAL HIGH (ref <5.7)
Mean Plasma Glucose: 123 mg/dL
eAG (mmol/L): 6.8 mmol/L

## 2021-08-23 LAB — MAGNESIUM: Magnesium: 2.2 mg/dL (ref 1.5–2.5)

## 2021-09-05 ENCOUNTER — Encounter: Payer: Self-pay | Admitting: Nurse Practitioner

## 2021-09-13 ENCOUNTER — Other Ambulatory Visit: Payer: Self-pay | Admitting: Nurse Practitioner

## 2021-09-13 ENCOUNTER — Encounter: Payer: Self-pay | Admitting: Nurse Practitioner

## 2021-09-13 DIAGNOSIS — E1122 Type 2 diabetes mellitus with diabetic chronic kidney disease: Secondary | ICD-10-CM

## 2021-09-13 MED ORDER — MOUNJARO 10 MG/0.5ML ~~LOC~~ SOAJ: 10.0000 mg | SUBCUTANEOUS | 3 refills | Status: DC

## 2021-09-27 ENCOUNTER — Other Ambulatory Visit: Payer: Self-pay | Admitting: Adult Health

## 2021-09-27 ENCOUNTER — Telehealth: Payer: Self-pay | Admitting: Nurse Practitioner

## 2021-09-27 ENCOUNTER — Other Ambulatory Visit: Payer: Self-pay | Admitting: Internal Medicine

## 2021-09-27 DIAGNOSIS — R450 Nervousness: Secondary | ICD-10-CM

## 2021-09-27 DIAGNOSIS — E119 Type 2 diabetes mellitus without complications: Secondary | ICD-10-CM | POA: Diagnosis not present

## 2021-09-27 DIAGNOSIS — H5213 Myopia, bilateral: Secondary | ICD-10-CM | POA: Diagnosis not present

## 2021-09-27 DIAGNOSIS — E782 Mixed hyperlipidemia: Secondary | ICD-10-CM

## 2021-09-27 DIAGNOSIS — H52223 Regular astigmatism, bilateral: Secondary | ICD-10-CM | POA: Diagnosis not present

## 2021-09-27 DIAGNOSIS — F5101 Primary insomnia: Secondary | ICD-10-CM

## 2021-09-27 DIAGNOSIS — H524 Presbyopia: Secondary | ICD-10-CM | POA: Diagnosis not present

## 2021-09-27 NOTE — Telephone Encounter (Signed)
Pharmacist called saying that they were concerned due to a conversation she had with Melvin Shelton. She felt that he did not have a good education as to how he should be taking his blood sugars and blood pressures while on his Mounjaro because he said he doesn't take it at all. She wanted to make [place] aware of that but didn't need anything else.  ?

## 2021-09-27 NOTE — Telephone Encounter (Signed)
Please call pt and make sure he does check his blood sugar a couple of times a week

## 2021-10-08 ENCOUNTER — Telehealth: Payer: Self-pay | Admitting: Adult Health

## 2021-10-08 NOTE — Telephone Encounter (Signed)
Pt is wanting information on how to get mounjaro and how to get a discount on it. He said when he came in last week with his wife Dr. Melford Aase told him to ask you about it cause he saw Caryl Pina last.  ?

## 2021-10-16 ENCOUNTER — Telehealth: Payer: Medicare Other | Admitting: Pharmacist

## 2021-10-16 NOTE — Progress Notes (Signed)
3 MONTH FOLLOW UP ?Assessment:  ? ? ?Essential hypertension ?Continue medication; discussed benefit of transition to ACE/ARB, consider at future OV; defer for today per patient  ?Monitor blood pressure at home; call if consistently over 130/80 ?Continue DASH diet.   ?Reminder to go to the ER if any CP, SOB, nausea, dizziness, severe HA, changes vision/speech, left arm numbness and tingling and jaw pain. ? ?Hyperlipidemia associated with T2DM ([place]) ?Continue medications - rosuvastatin, fenofibrate ?Discussed LDL goal <70, increase rosuvastatin if above goal, diet discussed ?Continue low cholesterol diet and exercise.  ? ? ?Type 2 diabetes mellitus with stage 2 chronic kidney disease, without long-term current use of insulin ([place]) ?Now on metformin 2000 mg daily and tolerating ?Continue Mounjaro 10 mg SQ qw ?Discussed dietary and exercise modifications ?- CMP ? ?CKD II associated with T2DM ([place]) ?Has been stable for many years;  ?Increase fluids, avoid NSAIDS, monitor sugars, will monitor ? ?Microalbuminuria associated with T2DM ([place]) ?Amlodipine was stopped and is on Losartan ?Recheck today ?- Microalbumin/creatinine urine ratio ?- CMP ? ?Psoriatic arthritis ([place]) ?Followed by Dr. Lyman Speller ?Risankizumab injections Q12 weeks ? ?Vitamin D deficiency ?At goal at recent check; continue to recommend supplementation for goal of 60-100 ? ? ?Obesity (BMI 30+) ?Long discussion about weight loss, diet, and exercise ?Recommended diet heavy in fruits and veggies and low in animal meats, cheeses, and dairy products, appropriate calorie intake ?Discussed appropriate weight for height, goal of <190 lb set ?Increase fiber, add walking  ?Follow up at next visit ? ?Elevated LFT ?Check CMP ?Limit Tylenol use, focus on weight loss ? ?Primary insomnia  ? Discussed good sleep hygiene, decrease stimulation prior to sleep ?Increase day time activity, Avoid caffeine in evenings ?Continue trazodone with benefit, doing well limiting benzo  ?   ?Crohn's disease without complication, unspecified gastrointestinal tract location [facility]) ?Doing well at this time, continue follow up Dr. Lyman Speller ? ?Medication management ?Continued ? ? ? ? ? ? ? ?Over 30 minutes of exam, counseling, chart review, and critical decision making was performed ? ?Future Appointments  ?Date Time Provider [place]  ?01/24/2022  3:00 PM Unk Pinto, MD [facility] None  ?08/27/2022  4:00 PM Liane Comber, NP [facility] None  ? ? ? ?Subjective:  ?Melvin Shelton is a 76 y.o. male who presents for 3 month follow up for HTN, hyperlipidemia, T2DM with CKD II, and vitamin D Def.  ? 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 He is taking Risankizumab injections Q12 weeks. And follows with Dr Lyman Speller.  ? ?1 week ago his 50 lb puppy hit his right knee and he fell. He was having pain with flexion of knee but is improving. Using CBD balm and is helping. \ ? 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 ? ?He has insomnia, taking trazodone, xanax 0.5 mg, takes 1-2 days per week. Typical week he will do 5 days of Trazodone and 2 days of Xanax ? ?BMI is Body mass index is 27.32 kg/m?., he has been working on diet and exercise, trying to watch portions, but still admits could do better.  ?Planning to restart walking now that weather is improved, pushing water intake  ?Wt Readings from Last 3 Encounters:  ?10/17/21 185 lb (83.9 kg)  ?08/22/21 191 lb (86.6 kg)  ?06/14/21 201  lb 6.4 oz (91.4 kg)  ? ?He does not check BPs at home, today their BP is BP: 128/78 ?BP Readings from Last 3 Encounters:  ?10/17/21 128/78  ?08/22/21 132/74  ?06/14/21 140/72  ?  ? He does not workout. He denies chest pain, shortness of breath, dizziness. ? ? He is on cholesterol medication (rosuvastatin 20 mg daily,  fenofibrate 160 mg daily) and denies myalgias. His cholesterol is not at goal. The cholesterol last visit was:   ?Lab Results  ?Component Value Date  ? CHOL 140 08/22/2021  ? HDL 42 08/22/2021  ? [place] 68 08/22/2021  ? TRIG 256 (H) 08/22/2021  ? CHOLHDL 3.3 08/22/2021  ? ? He has been working on diet and exercise for T2DM with CKD II, lifestyle controlled for several years until Feb 2021 7+ and started on metformin 500 mg BID  and Mounjaro 10 mg SQ qw, and denies increased appetite, nausea, paresthesia of the feet, polydipsia, polyuria, visual disturbances and vomiting. Doesn't have glucometer, prefers to not start checking if possible.  Started Mounjaro and is on the first month- no side effects and has not noticed any decreased appetite ?Last A1C in the office was:  ?Lab Results  ?Component Value Date  ? HGBA1C 5.9 (H) 08/22/2021  ? ? He has CKD II associated with T2DM monitored at this office  ?Lab Results  ?Component Value Date  ? GFRNONAA 85 08/22/2020  ? ?Patient is on Vitamin D supplement.   ?Lab Results  ?Component Value Date  ? VD25OH 61 01/24/2021  ?   ?  ? ?Medication Review: ?Current Outpatient Medications on File Prior to Visit  ?Medication Sig Dispense Refill  ? ALPRAZolam (XANAX) 0.5 MG tablet TAKE 1/2 TO 1 TABLET BY MOUTH EVERY NIGHT AT BEDTIME ONLY IF NEEDED FOR SLEEP, LIMIT TO 5 DAYS A WEEK TO AVOID ADDICTION AND DEMENTIA 30 tablet 0  ? aspirin 81 MG tablet Take 81 mg by mouth daily.    ? Calcium Carbonate (CALCIUM 600 PO) Take 600 mg by mouth daily.    ? fenofibrate 160 MG tablet TAKE 1 TABLET BY MOUTH DAILY FOR TRIGLYCERIDES( BLOOD FATS) 90 tablet 1  ? losartan (COZAAR) 50 MG tablet Take 1 tab daily for blood pressure and diabetes kidney protection. 90 tablet 3  ? Magnesium 500 MG CAPS Take 500 mg by mouth daily.    ? metFORMIN (GLUCOPHAGE-XR) 500 MG 24 hr tablet TAKE 2 TABLETS BY MOUTH TWICE DAILY WITH MEALS FOR DIABETES 360 tablet 3  ? Omega-3 Fatty Acids (FISH OIL) 1200 MG CAPS Take by mouth  daily.     ? OVER THE COUNTER MEDICATION Vitamin C. 500 mg. One tablet daily.    ? Risankizumab-rzaa,150 MG Dose, 75 MG/0.83ML PSKT Inject into the skin. Inject every 12 weeks    ? rosuvastatin (CRESTOR) 20 MG tablet TAKE 1 TABLET(20 MG) BY MOUTH AT BEDTIME 90 tablet 3  ? traZODone (DESYREL) 150 MG tablet TAKE 1 TABLET BY MOUTH EVERY NIGHT 1 HOUR BEFORE BEDTIME AS NEEDED FOR SLEEP 90 tablet 3  ? VITAMIN D PO Take 10,000 Units by mouth daily.    ? ?No current facility-administered medications on file prior to visit.  ? ? ?Allergies: ?Not on File ? ?Current Problems (verified) ?has Hypertension; Psoriatic arthritis ([place]); Hyperlipidemia associated with type 2 diabetes mellitus ([place]); Osteoporosis; Fatty liver; Hx of colonic polyp; Medication management; Vitamin D deficiency; Encounter for Medicare annual wellness exam; Multiple atypical nevi; Crohn disease ([place]); Overweight (BMI 25.0-29.9); T2DM (type  2 diabetes mellitus) ([place]); CKD stage 2 due to type 2 diabetes mellitus ([place]); Insomnia; and Microalbuminuria due to type 2 diabetes mellitus ([place]) on their problem list. ? ? ?Surgical: ?He  has a past surgical history that includes Colonoscopy; Polypectomy; and Tonsillectomy. ?Family ?His family history includes Diabetes in his father; Heart disease in his father; Hypertension in his father; Lymphoma in his mother; Prostate cancer in his father. ?Social history  ?He reports that he has never smoked. He has never used smokeless tobacco. He reports current alcohol use. He reports that he does not use drugs. ? ? ?Review of Systems  ?Constitutional:  Negative for malaise/fatigue and weight loss.  ?HENT:  Negative for hearing loss and tinnitus.   ?Eyes:  Negative for blurred vision and double vision.  ?Respiratory:  Negative for cough, shortness of breath and wheezing.   ?Cardiovascular:  Negative for chest pain, palpitations, orthopnea, claudication and leg swelling.  ?Gastrointestinal:  Negative for abdominal pain, blood  in stool, constipation, diarrhea, heartburn, melena, nausea and vomiting.  ?Genitourinary: Negative.   ?Musculoskeletal:  Positive for joint pain (right knee). Negative for myalgias.  ?Skin:  Negative

## 2021-10-17 ENCOUNTER — Ambulatory Visit (INDEPENDENT_AMBULATORY_CARE_PROVIDER_SITE_OTHER): Payer: Medicare Other | Admitting: Nurse Practitioner

## 2021-10-17 ENCOUNTER — Encounter: Payer: Self-pay | Admitting: Nurse Practitioner

## 2021-10-17 VITALS — BP 128/78 | HR 80 | Temp 96.8°F | Wt 185.0 lb

## 2021-10-17 DIAGNOSIS — R809 Proteinuria, unspecified: Secondary | ICD-10-CM | POA: Diagnosis not present

## 2021-10-17 DIAGNOSIS — E1169 Type 2 diabetes mellitus with other specified complication: Secondary | ICD-10-CM | POA: Diagnosis not present

## 2021-10-17 DIAGNOSIS — L4050 Arthropathic psoriasis, unspecified: Secondary | ICD-10-CM | POA: Diagnosis not present

## 2021-10-17 DIAGNOSIS — R7989 Other specified abnormal findings of blood chemistry: Secondary | ICD-10-CM

## 2021-10-17 DIAGNOSIS — I10 Essential (primary) hypertension: Secondary | ICD-10-CM | POA: Diagnosis not present

## 2021-10-17 DIAGNOSIS — N182 Chronic kidney disease, stage 2 (mild): Secondary | ICD-10-CM

## 2021-10-17 DIAGNOSIS — K5090 Crohn's disease, unspecified, without complications: Secondary | ICD-10-CM

## 2021-10-17 DIAGNOSIS — G4700 Insomnia, unspecified: Secondary | ICD-10-CM | POA: Diagnosis not present

## 2021-10-17 DIAGNOSIS — E1122 Type 2 diabetes mellitus with diabetic chronic kidney disease: Secondary | ICD-10-CM | POA: Diagnosis not present

## 2021-10-17 DIAGNOSIS — E559 Vitamin D deficiency, unspecified: Secondary | ICD-10-CM | POA: Diagnosis not present

## 2021-10-17 DIAGNOSIS — E669 Obesity, unspecified: Secondary | ICD-10-CM

## 2021-10-17 DIAGNOSIS — E1129 Type 2 diabetes mellitus with other diabetic kidney complication: Secondary | ICD-10-CM | POA: Diagnosis not present

## 2021-10-17 DIAGNOSIS — M810 Age-related osteoporosis without current pathological fracture: Secondary | ICD-10-CM | POA: Diagnosis not present

## 2021-10-17 DIAGNOSIS — Z79899 Other long term (current) drug therapy: Secondary | ICD-10-CM | POA: Diagnosis not present

## 2021-10-17 DIAGNOSIS — E785 Hyperlipidemia, unspecified: Secondary | ICD-10-CM

## 2021-10-17 MED ORDER — MOUNJARO 10 MG/0.5ML ~~LOC~~ SOAJ: 10.0000 mg | SUBCUTANEOUS | 3 refills | Status: DC

## 2021-10-17 MED ORDER — MOUNJARO 10 MG/0.5ML ~~LOC~~ SOAJ: 10.0000 mg | SUBCUTANEOUS | 2 refills | Status: DC

## 2021-10-19 LAB — MICROALBUMIN / CREATININE URINE RATIO
Creatinine, Urine: 120 mg/dL (ref 20–320)
Microalb Creat Ratio: 104 mcg/mg creat — ABNORMAL HIGH (ref <30)
Microalb, Ur: 12.5 mg/dL

## 2021-10-19 LAB — COMPLETE METABOLIC PANEL WITH GFR

## 2021-10-19 LAB — EXTRA URINE SPECIMEN

## 2021-10-30 ENCOUNTER — Encounter: Payer: Self-pay | Admitting: Adult Health

## 2021-11-10 ENCOUNTER — Encounter: Payer: Self-pay | Admitting: Nurse Practitioner

## 2021-11-12 ENCOUNTER — Other Ambulatory Visit: Payer: Self-pay | Admitting: Nurse Practitioner

## 2021-11-12 DIAGNOSIS — R450 Nervousness: Secondary | ICD-10-CM

## 2022-01-23 ENCOUNTER — Encounter: Payer: Self-pay | Admitting: Internal Medicine

## 2022-01-23 NOTE — Progress Notes (Unsigned)
Annual  Screening/Preventative Visit  & Comprehensive Evaluation & Examination  Future Appointments  Date Time Provider Department  01/24/2022                cpe  3:00 PM Unk Pinto, MD [facility]  08/27/2022               wellness  4:00 PM Darrol Jump, NP [facility]  01/29/2023                cpe  3:00 PM Unk Pinto, MD [facility]             This very nice 76 y.o. MWM presents for a Screening /Preventative Visit & comprehensive evaluation and management of multiple medical co-morbidities.  Patient has been followed for HTN, HLD, T2_NIDDM  and Vitamin D Deficiency.                                                     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  He is taking Risankizumab injections Orson Ape) Q12weeks and follows with his dermatologist  - Dr Lyman Speller.        HTN predates circa  2013. Patient's BP has been controlled at home.  Today's BP is  at goal  - 134/70 . Patient denies any cardiac symptoms as chest pain, palpitations, shortness of breath, dizziness or ankle swelling.       Patient's hyperlipidemia is controlled with diet and medications. Patient denies myalgias or other medication SE's. Last lipids were at goal for Cholesterol, but Trig's' are elevated :  Lab Results  Component Value Date   CHOL 140 08/22/2021   HDL 42 08/22/2021   LDLCALC 68 08/22/2021   TRIG 256 (H) 08/22/2021   CHOLHDL 3.3 08/22/2021           Patient has hx/o T2_NIDDM (A1c  6.5% /2012) and  in Feb 2021 , he was started on Metformin.   Patient denies reactive hypoglycemic symptoms, visual blurring, diabetic polys or paresthesias. Last A1c was not at goal:   Lab Results  Component Value Date   HGBA1C 5.9 (H) 08/22/2021          Finally, patient has history of Vitamin D Deficiency of    and last vitamin D was at goal :   Lab  Results  Component Value Date   VD25OH 61 01/24/2021     Current Outpatient Medications on File Prior to Visit  Medication Sig   ALPRAZolam (XANAX) 0.5 MG tablet TAKE 1/2 TO 1 TAB EVERY NIGHT AT BEDTIME    aspirin 81 MG tablet Take  daily.   Calcium 600  mg Take daily.   fenofibrate 160 MG tablet TAKE 1 TABLET DAILY    losartan (COZAAR) 50 MG tablet Take 1 tab daily   Magnesium 500 MG CAPS Take daily.   metFORMIN -XR 500 MG  TAKE 2 TABLETS  TWICE DAILY WITH MEALS    Omega-3 FISH OIL 1200 MG CAPS Take  daily.    OTC Vitamin C. 500 mg. One tablet daily.   Risankizumab-rzaa,150 MG Dose, 75 MG/0.83ML  Inject into the skin. Inject every 12 weeks   rosuvastatin (CRESTOR) 20 MG tablet  TAKE 1 TABLET AT BEDTIME   tirzepatide (MOUNJARO) 10 MG/0.5ML Pen Inject 10 mg into the skin once a week.   traZODone (DESYREL) 150 MG tablet TAKE 1 TABLET  EVERY NIGHT    VITAMIN D  Take 10,000 Units by mouth daily.     Past Medical History:  Diagnosis Date   Arthritis    Crohn's colitis ([place])    Elevated LFTs    Hx of colonic polyp    Hyperlipidemia    Hypertension    Hypogonadism male    Osteoporosis    Psoriatic arthritis ([place])    Renal glycosuria ([place])      Health Maintenance  Topic Date Due   OPHTHALMOLOGY EXAM  Never done   Zoster Vaccines- Shingrix (1 of 2) Never done   COVID-19 Vaccine (4 - Moderna risk series) 03/18/2020   INFLUENZA VACCINE  01/01/2022   FOOT EXAM  01/24/2022   HEMOGLOBIN A1C  02/22/2022   TETANUS/TDAP  03/17/2024   Pneumonia Vaccine 52+ Years old  Completed   Hepatitis C Screening  Completed   HPV VACCINES  Aged Out     Immunization History  Administered Date(s) Administered   DT  03/17/2014   Influenza, High Dose  06/14/2021   Moderna Sars-Covid-2 Vacc 07/16/2019, 08/13/2019, 01/22/2020   PPD Test 07/15/2016, 01/22/2017   Pneumococcal -13 09/04/2016   Pneumococcal -23 12/10/2017   Pneumococcal-23 06/04/2003    Last Colon - 05/20/2018 - Dr Loletha Carrow -  Crohn's / Ileitis - active                     - recc treat & f/u 1 year  - Overdue - Patient aware  Past Surgical History:  Procedure Laterality Date   COLONOSCOPY     POLYPECTOMY     TONSILLECTOMY     age 21-5      Family History  Problem Relation Age of Onset   Lymphoma Mother    Heart disease Father    Hypertension Father    Diabetes Father    Prostate cancer Father    Colon cancer Neg Hx    Esophageal cancer Neg Hx    Liver cancer Neg Hx    Pancreatic cancer Neg Hx    Rectal cancer Neg Hx    Stomach cancer Neg Hx    Colon polyps Neg Hx     Social History   Socioeconomic History   Marital status: Married    Spouse name: Olin Hauser  Occupational History   Jazz musician  Tobacco Use   Smoking status: Never   Smokeless tobacco: Never  Vaping Use   Vaping Use: Never used  Substance and Sexual Activity   Alcohol use: Yes    Comment: ocassional   Drug use: No   Sexual activity: Not on file    ROS Constitutional: Denies fever, chills, weight loss/gain, headaches, insomnia,  night sweats or change in appetite. Does c/o fatigue. Eyes: Denies redness, blurred vision, diplopia, discharge, itchy or watery eyes.  ENT: Denies discharge, congestion, post nasal drip, epistaxis, sore throat, earache, hearing loss, dental pain, Tinnitus, Vertigo, Sinus pain or snoring.  Cardio: Denies chest pain, palpitations, irregular heartbeat, syncope, dyspnea, diaphoresis, orthopnea, PND, claudication or edema Respiratory: denies cough, dyspnea, DOE, pleurisy, hoarseness, laryngitis or wheezing.  Gastrointestinal: Denies dysphagia, heartburn, reflux, water brash, pain, cramps, nausea, vomiting, bloating, diarrhea, constipation, hematemesis, melena, hematochezia, jaundice or hemorrhoids Genitourinary: Denies dysuria, frequency, urgency, nocturia, hesitancy, discharge, hematuria or flank pain Musculoskeletal: Denies arthralgia, myalgia,  stiffness, Jt. Swelling, pain, limp or strain/sprain.  Denies Falls. Skin: Denies puritis, rash, hives, warts, acne, eczema or change in skin lesion Neuro: No weakness, tremor, incoordination, spasms, paresthesia or pain Psychiatric: Denies confusion, memory loss or sensory loss. Denies Depression. Endocrine: Denies change in weight, skin, hair change, nocturia, and paresthesia, diabetic polys, visual blurring or hyper / hypo glycemic episodes.  Heme/Lymph: No excessive bleeding, bruising or enlarged lymph nodes.   Physical Exam  BP 134/70   Pulse 80   Temp (!) 97.5 F (36.4 C)   Resp 16   Ht 5' 9"  (1.753 m)   Wt 178 lb 6.4 oz (80.9 kg)   SpO2 97%   BMI 26.35 kg/m   General Appearance: Well nourished and well groomed and in no apparent distress.  Eyes: PERRLA, EOMs, conjunctiva no swelling or erythema, normal fundi and vessels. Sinuses: No frontal/maxillary tenderness ENT/Mouth: EACs patent / TMs  nl. Nares clear without erythema, swelling, mucoid exudates. Oral hygiene is good. No erythema, swelling, or exudate. Tongue normal, non-obstructing. Tonsils not swollen or erythematous. Hearing normal.  Neck: Supple, thyroid not palpable. No bruits, nodes or JVD. Respiratory: Respiratory effort normal.  BS equal and clear bilateral without rales, rhonci, wheezing or stridor. Cardio: Heart sounds are normal with regular rate and rhythm and no murmurs, rubs or gallops. Peripheral pulses are normal and equal bilaterally without edema. No aortic or femoral bruits. Chest: symmetric with normal excursions and percussion.  Abdomen: Soft, with Nl bowel sounds. Nontender, no guarding, rebound, hernias, masses, or organomegaly.  Lymphatics: Non tender without lymphadenopathy.  Musculoskeletal: Full ROM all peripheral extremities, joint stability, 5/5 strength, and normal gait. Skin: Warm and dry without rashes, lesions, cyanosis, clubbing or  ecchymosis.  Neuro: Cranial nerves intact, reflexes equal bilaterally. Normal muscle tone, no cerebellar  symptoms. Sensation intact.  Pysch: Alert and oriented X 3 with normal affect, insight and judgment appropriate.   Assessment and Plan  1. Annual Preventative/Screening Exam    2. Essential hypertension  - EKG 12-Lead - [place], RETROPERITNL ABD,  LTD - Urinalysis, Routine w reflex microscopic - Microalbumin / creatinine urine ratio - CBC with Differential/Platelet - COMPLETE METABOLIC PANEL WITH GFR - Magnesium - TSH  3. Hyperlipidemia associated with type 2 diabetes mellitus ([place])  - EKG 12-Lead - [place], RETROPERITNL ABD,  LTD - Lipid panel - TSH  4. Type 2 diabetes mellitus with stage 2 chronic kidney  disease, without long-term current use of insulin (HCC)  - EKG 12-Lead - [place], RETROPERITNL ABD,  LTD - Hemoglobin A1c - Insulin, random  5. Vitamin D deficiency  - VITAMIN D 25 Hydroxy   6. Crohn's disease without complication ([place])   7. Psoriatic arthritis ([place])   8. BPH with obstruction/lower urinary tract symptoms  - PSA  9. Screening for colorectal cancer  - POC Hemoccult Bld/Stl   10. Prostate cancer screening  - PSA  11. Screening for heart disease  - EKG 12-Lead  12. FHx: heart disease  - EKG 12-Lead - [place], RETROPERITNL ABD,  LTD  13. Former smoker  - EKG 12-Lead - [place], RETROPERITNL ABD,  LTD  14. Screening for AAA (aortic abdominal aneurysm)  - [place], RETROPERITNL ABD,  LTD  15. Medication management  - Urinalysis, Routine w reflex microscopic - Microalbumin / creatinine urine ratio - CBC with Differential/Platelet - COMPLETE METABOLIC PANEL WITH GFR - Magnesium - Lipid panel - TSH - Hemoglobin A1c - Insulin, random - VITAMIN D 25 Hydroxy  Patient was counseled in prudent diet, weight control to achieve/maintain BMI less than 25, BP monitoring, regular exercise and medications as discussed.  Discussed med effects and SE's. Routine screening labs and tests as requested with regular follow-up as recommended. Over 40 minutes  of exam, counseling, chart review and high complex critical decision making was performed   Kirtland Bouchard, MD

## 2022-01-23 NOTE — Patient Instructions (Signed)

## 2022-01-24 ENCOUNTER — Ambulatory Visit (INDEPENDENT_AMBULATORY_CARE_PROVIDER_SITE_OTHER): Payer: Medicare Other | Admitting: Internal Medicine

## 2022-01-24 ENCOUNTER — Encounter: Payer: Self-pay | Admitting: Internal Medicine

## 2022-01-24 VITALS — BP 134/70 | HR 80 | Temp 97.5°F | Resp 16 | Ht 69.0 in | Wt 178.4 lb

## 2022-01-24 DIAGNOSIS — Z136 Encounter for screening for cardiovascular disorders: Secondary | ICD-10-CM

## 2022-01-24 DIAGNOSIS — Z87891 Personal history of nicotine dependence: Secondary | ICD-10-CM

## 2022-01-24 DIAGNOSIS — Z8249 Family history of ischemic heart disease and other diseases of the circulatory system: Secondary | ICD-10-CM | POA: Diagnosis not present

## 2022-01-24 DIAGNOSIS — L4050 Arthropathic psoriasis, unspecified: Secondary | ICD-10-CM

## 2022-01-24 DIAGNOSIS — Z0000 Encounter for general adult medical examination without abnormal findings: Secondary | ICD-10-CM

## 2022-01-24 DIAGNOSIS — E559 Vitamin D deficiency, unspecified: Secondary | ICD-10-CM

## 2022-01-24 DIAGNOSIS — E1169 Type 2 diabetes mellitus with other specified complication: Secondary | ICD-10-CM

## 2022-01-24 DIAGNOSIS — I10 Essential (primary) hypertension: Secondary | ICD-10-CM | POA: Diagnosis not present

## 2022-01-24 DIAGNOSIS — N138 Other obstructive and reflux uropathy: Secondary | ICD-10-CM

## 2022-01-24 DIAGNOSIS — N401 Enlarged prostate with lower urinary tract symptoms: Secondary | ICD-10-CM

## 2022-01-24 DIAGNOSIS — K5090 Crohn's disease, unspecified, without complications: Secondary | ICD-10-CM

## 2022-01-24 DIAGNOSIS — E1122 Type 2 diabetes mellitus with diabetic chronic kidney disease: Secondary | ICD-10-CM

## 2022-01-24 DIAGNOSIS — Z1211 Encounter for screening for malignant neoplasm of colon: Secondary | ICD-10-CM

## 2022-01-24 DIAGNOSIS — I700 Atherosclerosis of aorta: Secondary | ICD-10-CM | POA: Diagnosis not present

## 2022-01-24 DIAGNOSIS — Z125 Encounter for screening for malignant neoplasm of prostate: Secondary | ICD-10-CM

## 2022-01-24 DIAGNOSIS — Z79899 Other long term (current) drug therapy: Secondary | ICD-10-CM

## 2022-01-24 DIAGNOSIS — N182 Chronic kidney disease, stage 2 (mild): Secondary | ICD-10-CM | POA: Diagnosis not present

## 2022-01-24 DIAGNOSIS — Z0001 Encounter for general adult medical examination with abnormal findings: Secondary | ICD-10-CM

## 2022-01-24 NOTE — Progress Notes (Signed)
Aorta Scan < 3 cm

## 2022-01-25 LAB — LIPID PANEL
Cholesterol: 126 mg/dL (ref <200)
HDL: 44 mg/dL (ref >=40)
LDL Cholesterol (Calc): 58 mg/dL (calc)
Non-HDL Cholesterol (Calc): 82 mg/dL (calc) (ref <130)
Total CHOL/HDL Ratio: 2.9 (calc) (ref <5.0)
Triglycerides: 154 mg/dL — ABNORMAL HIGH (ref <150)

## 2022-01-25 LAB — URINALYSIS, ROUTINE W REFLEX MICROSCOPIC
Bilirubin Urine: NEGATIVE
Glucose, UA: NEGATIVE
Hgb urine dipstick: NEGATIVE
Hyaline Cast: NONE SEEN /LPF
Nitrite: NEGATIVE
Specific Gravity, Urine: 1.02 (ref 1.001–1.035)
Squamous Epithelial / HPF: NONE SEEN /HPF (ref <=5)
pH: 7.5 (ref 5.0–8.0)

## 2022-01-25 LAB — CBC WITH DIFFERENTIAL/PLATELET
Absolute Monocytes: 482 cells/uL (ref 200–950)
Basophils Absolute: 73 cells/uL (ref 0–200)
Basophils Relative: 1 %
Eosinophils Absolute: 131 cells/uL (ref 15–500)
Eosinophils Relative: 1.8 %
HCT: 44.6 % (ref 38.5–50.0)
Hemoglobin: 15.6 g/dL (ref 13.2–17.1)
Lymphs Abs: 2402 cells/uL (ref 850–3900)
MCH: 32.4 pg (ref 27.0–33.0)
MCHC: 35 g/dL (ref 32.0–36.0)
MCV: 92.7 fL (ref 80.0–100.0)
MPV: 9.9 fL (ref 7.5–12.5)
Monocytes Relative: 6.6 %
Neutro Abs: 4212 cells/uL (ref 1500–7800)
Neutrophils Relative %: 57.7 %
Platelets: 357 10*3/uL (ref 140–400)
RBC: 4.81 10*6/uL (ref 4.20–5.80)
RDW: 13.3 % (ref 11.0–15.0)
Total Lymphocyte: 32.9 %
WBC: 7.3 10*3/uL (ref 3.8–10.8)

## 2022-01-25 LAB — COMPLETE METABOLIC PANEL WITH GFR
AG Ratio: 1.5 (calc) (ref 1.0–2.5)
ALT: 29 U/L (ref 9–46)
AST: 33 U/L (ref 10–35)
Albumin: 4.3 g/dL (ref 3.6–5.1)
Alkaline phosphatase (APISO): 31 U/L — ABNORMAL LOW (ref 35–144)
BUN: 13 mg/dL (ref 7–25)
CO2: 21 mmol/L (ref 20–32)
Calcium: 9.9 mg/dL (ref 8.6–10.3)
Chloride: 104 mmol/L (ref 98–110)
Creat: 1.01 mg/dL (ref 0.70–1.28)
Globulin: 2.9 g/dL (calc) (ref 1.9–3.7)
Glucose, Bld: 79 mg/dL (ref 65–99)
Potassium: 4.1 mmol/L (ref 3.5–5.3)
Sodium: 137 mmol/L (ref 135–146)
Total Bilirubin: 0.8 mg/dL (ref 0.2–1.2)
Total Protein: 7.2 g/dL (ref 6.1–8.1)
eGFR: 78 mL/min/{1.73_m2} (ref >=60)

## 2022-01-25 LAB — MICROALBUMIN / CREATININE URINE RATIO
Creatinine, Urine: 102 mg/dL (ref 20–320)
Microalb Creat Ratio: 74 mcg/mg creat — ABNORMAL HIGH (ref <30)
Microalb, Ur: 7.5 mg/dL

## 2022-01-25 LAB — HEMOGLOBIN A1C
Hgb A1c MFr Bld: 5.3 % of total Hgb (ref <5.7)
Mean Plasma Glucose: 105 mg/dL
eAG (mmol/L): 5.8 mmol/L

## 2022-01-25 LAB — MAGNESIUM: Magnesium: 1.9 mg/dL (ref 1.5–2.5)

## 2022-01-25 LAB — VITAMIN D 25 HYDROXY (VIT D DEFICIENCY, FRACTURES): Vit D, 25-Hydroxy: 71 ng/mL (ref 30–100)

## 2022-01-25 LAB — INSULIN, RANDOM: Insulin: 8.9 u[IU]/mL

## 2022-01-25 LAB — PSA: PSA: 3.74 ng/mL (ref <=4.00)

## 2022-01-25 LAB — TSH: TSH: 1.4 mIU/L (ref 0.40–4.50)

## 2022-01-29 ENCOUNTER — Other Ambulatory Visit: Payer: Self-pay | Admitting: Nurse Practitioner

## 2022-01-29 ENCOUNTER — Encounter: Payer: Self-pay | Admitting: Internal Medicine

## 2022-01-29 DIAGNOSIS — E1122 Type 2 diabetes mellitus with diabetic chronic kidney disease: Secondary | ICD-10-CM

## 2022-01-29 MED ORDER — MOUNJARO 12.5 MG/0.5ML ~~LOC~~ SOAJ: 12.5000 mg | SUBCUTANEOUS | 3 refills | Status: DC

## 2022-01-31 ENCOUNTER — Other Ambulatory Visit: Payer: Self-pay | Admitting: Nurse Practitioner

## 2022-01-31 DIAGNOSIS — R450 Nervousness: Secondary | ICD-10-CM

## 2022-03-09 ENCOUNTER — Other Ambulatory Visit: Payer: Self-pay | Admitting: Nurse Practitioner

## 2022-03-09 DIAGNOSIS — E782 Mixed hyperlipidemia: Secondary | ICD-10-CM

## 2022-04-03 ENCOUNTER — Other Ambulatory Visit: Payer: Self-pay | Admitting: Nurse Practitioner

## 2022-04-03 DIAGNOSIS — R450 Nervousness: Secondary | ICD-10-CM

## 2022-04-03 MED ORDER — ALPRAZOLAM 0.5 MG PO TABS: ORAL_TABLET | ORAL | 0 refills | Status: DC

## 2022-04-12 ENCOUNTER — Encounter: Payer: Self-pay | Admitting: Internal Medicine

## 2022-04-13 ENCOUNTER — Other Ambulatory Visit: Payer: Self-pay | Admitting: Internal Medicine

## 2022-04-13 DIAGNOSIS — E782 Mixed hyperlipidemia: Secondary | ICD-10-CM

## 2022-04-13 MED ORDER — FENOFIBRATE 160 MG PO TABS
ORAL_TABLET | ORAL | 3 refills | Status: DC
Start: 2022-04-13 — End: 2022-09-24

## 2022-05-02 ENCOUNTER — Ambulatory Visit (INDEPENDENT_AMBULATORY_CARE_PROVIDER_SITE_OTHER): Payer: Medicare Other | Admitting: Internal Medicine

## 2022-05-02 ENCOUNTER — Encounter: Payer: Self-pay | Admitting: Internal Medicine

## 2022-05-02 VITALS — BP 136/60 | HR 70 | Temp 97.1°F | Ht 69.0 in | Wt 180.8 lb

## 2022-05-02 DIAGNOSIS — I10 Essential (primary) hypertension: Secondary | ICD-10-CM

## 2022-05-02 DIAGNOSIS — E785 Hyperlipidemia, unspecified: Secondary | ICD-10-CM | POA: Diagnosis not present

## 2022-05-02 DIAGNOSIS — Z79899 Other long term (current) drug therapy: Secondary | ICD-10-CM | POA: Diagnosis not present

## 2022-05-02 DIAGNOSIS — E1122 Type 2 diabetes mellitus with diabetic chronic kidney disease: Secondary | ICD-10-CM | POA: Diagnosis not present

## 2022-05-02 DIAGNOSIS — E1169 Type 2 diabetes mellitus with other specified complication: Secondary | ICD-10-CM

## 2022-05-02 DIAGNOSIS — N182 Chronic kidney disease, stage 2 (mild): Secondary | ICD-10-CM | POA: Diagnosis not present

## 2022-05-02 DIAGNOSIS — K5090 Crohn's disease, unspecified, without complications: Secondary | ICD-10-CM

## 2022-05-02 DIAGNOSIS — E559 Vitamin D deficiency, unspecified: Secondary | ICD-10-CM

## 2022-05-02 DIAGNOSIS — L4050 Arthropathic psoriasis, unspecified: Secondary | ICD-10-CM | POA: Diagnosis not present

## 2022-05-02 NOTE — Progress Notes (Signed)
Patient ID: Melvin Shelton, male   DOB: 07-28-1945, 76 y.o.   MRN: [id number]       Future Appointments  Date Time Provider Department  05/02/2022  2:30 PM Unk Pinto, MD [facility]  08/27/2022  4:00 PM Darrol Jump, NP [facility]  01/29/2023  3:00 PM Unk Pinto, MD [facility]    History of Present Illness:       This very nice 76 y.o. MWM presents for 3  month follow up with HTN, HLD, T2_NIDDM, Psoriatic Arthritis and Vitamin D Deficiency.    [[copied: Patient has long standing Guttate Psoriasis & Psoriatic Arthritis in remission on Cosentyx, but 07/2017, he  had Colonoscopy (Dr Loletha Carrow)  with  Bx (+) Crohn's in the TI, so Cosentyx was discontinued.  He is taking Risankizumab injections Q12weeks and follows with his dermatologist  - Dr Feldman.]]         Patient is treated for HTN (2013) & BP has been controlled at home. Today's BP is at goal - 136/60 . Patient has had no complaints of any cardiac type chest pain, palpitations, dyspnea Vertell Limber /PND, dizziness, claudication or dependent edema.        Hyperlipidemia is controlled with diet & Rosuvastatin. Patient denies myalgias or other med SE's. Last Lipids were at goal except elevated Trig's:  Lab Results  Component Value Date   CHOL 126 01/24/2022   HDL 44 01/24/2022   LDLCALC 58 01/24/2022   TRIG 154 (H) 01/24/2022   CHOLHDL 2.9 01/24/2022     Also, the patient has history of T2_NIDDM (A1c  6.5% /2012) w/CKD2  (GFR 80) and he managed with diet until required starting Metformin in Feb 2021.  Patient has had no symptoms of reactive hypoglycemia, diabetic polys, paresthesias or visual blurring.  Last A1c was not at goal:  Lab Results  Component Value Date   HGBA1C 5.3 01/24/2022         Further, the patient also has history of Vitamin D Deficiency and supplements vitamin D without any suspected side-effects. Last vitamin D was at goal:  Lab Results  Component Value Date   VD25OH 71 01/24/2022         Current Outpatient Medications  Medication Instructions   ALPRAZolam (XANAX) 0.5 MG tablet TAKE 1/2 TO 1 TABLET AT BEDTIME ONLY AS NEEDED    Aspirin  81 mg Daily   CALCIUM 600 Daily   VITAMIN D  5000 u  Daily    fenofibrate 160 MG tablet Take  1 tablet Daily f   losartan (COZAAR) 50 MG tablet Take 1 tab daily for blood pressure and diabetes kidney protection.   Magnesium 500 mg, Daily   metFORMIN -XR) 500 MG  TAKE 2 TABLETS  TWICE DAILY WITH MEALS    Mounjaro   12.5 mg Weekly   Omega-3 FISH OIL 1200 MG CAPS Daily   Vitamin C. 500 mg One tablet daily.    Risankizumab-rzaa,  150 MG , 75 MG/0.83ML   Inject every 12 weeks   rosuvastatin  20 MG tablet TAKE 1 TABLET(20 MG) BY MOUTH AT BEDTIME   traZODone 150 MG tablet TAKE 1 TABLET BEFORE BEDTIME      No Known Allergies  PMHx:   Past Medical History:  Diagnosis Date   Arthritis    Crohn's colitis ([place])    Elevated LFTs    Hx of colonic polyp    Hyperlipidemia    Hypertension    Hypogonadism male    Osteoporosis    Psoriatic  arthritis ([place])    Renal glycosuria ([place])     Immunization History  Administered Date(s) Administered   DT (Pediatric) 03/17/2014   Moderna Sars-Covid-2 Vaccination 08/13/2019, 01/22/2020   PPD Test 07/15/2016, 01/22/2017   Pneumococcal Conjugate-13 09/04/2016   Pneumococcal Polysaccharide-23 12/10/2017   Pneumococcal-Unspecified 06/04/2003    Past Surgical History:  Procedure Laterality Date   COLONOSCOPY     POLYPECTOMY     TONSILLECTOMY     age 60-5     FHx:    Reviewed / unchanged  SHx:    Reviewed / unchanged   Systems Review:  Constitutional: Denies fever, chills, wt changes, headaches, insomnia, fatigue, night sweats, change in appetite. Eyes: Denies redness, blurred vision, diplopia, discharge, itchy, watery eyes.  ENT: Denies discharge, congestion, post nasal drip, epistaxis, sore throat, earache, hearing loss, dental pain, tinnitus, vertigo, sinus pain, snoring.  CV: Denies  chest pain, palpitations, irregular heartbeat, syncope, dyspnea, diaphoresis, orthopnea, PND, claudication or edema. Respiratory: denies cough, dyspnea, DOE, pleurisy, hoarseness, laryngitis, wheezing.  Gastrointestinal: Denies dysphagia, odynophagia, heartburn, reflux, water brash, abdominal pain or cramps, nausea, vomiting, bloating, diarrhea, constipation, hematemesis, melena, hematochezia  or hemorrhoids. Genitourinary: Denies dysuria, frequency, urgency, nocturia, hesitancy, discharge, hematuria or flank pain. Musculoskeletal: Denies arthralgias, myalgias, stiffness, jt. swelling, pain, limping or strain/sprain.  Skin: Denies pruritus, rash, hives, warts, acne, eczema or change in skin lesion(s). Neuro: No weakness, tremor, incoordination, spasms, paresthesia or pain. Psychiatric: Denies confusion, memory loss or sensory loss. Endo: Denies change in weight, skin or hair change.  Heme/Lymph: No excessive bleeding, bruising or enlarged lymph nodes.  Physical Exam  BP 136/60   Pulse 70   Temp (!) 97.1 F (36.2 C)   Wt 180 lb 12.8 oz (82 kg)   SpO2 98%   BMI 26.70 kg/m   Appears  well nourished, well groomed  and in no distress.  Eyes: PERRLA, EOMs, conjunctiva no swelling or erythema. Sinuses: No frontal/maxillary tenderness ENT/Mouth: EAC's clear, TM's nl w/o erythema, bulging. Nares clear w/o erythema, swelling, exudates. Oropharynx clear without erythema or exudates. Oral hygiene is good. Tongue normal, non obstructing. Hearing intact.  Neck: Supple. Thyroid not palpable. Car 2+/2+ without bruits, nodes or JVD. Chest: Respirations nl with BS clear & equal w/o rales, rhonchi, wheezing or stridor.  Cor: Heart sounds normal w/ regular rate and rhythm without sig. murmurs, gallops, clicks or rubs. Peripheral pulses normal and equal  without edema.  Abdomen: Soft & bowel sounds normal. Non-tender w/o guarding, rebound, hernias, masses or organomegaly.  Lymphatics: Unremarkable.   Musculoskeletal: Full ROM all peripheral extremities, joint stability, 5/5 strength and normal gait.  Skin: Warm, dry without exposed rashes, lesions or ecchymosis apparent.  Neuro: Cranial nerves intact, reflexes equal bilaterally. Sensory-motor testing grossly intact. Tendon reflexes grossly intact.  Pysch: Alert & oriented x 3.  Insight and judgement nl & appropriate. No ideations.  Assessment and Plan:  1. Essential hypertension  - Continue medication, monitor blood pressure at home.  - Continue DASH diet.  Reminder to go to the ER if any CP,  SOB, nausea, dizziness, severe HA, changes vision/speech.  - CBC with Differential/Platelet - COMPLETE METABOLIC PANEL WITH GFR - Magnesium - TSH  2. Hyperlipidemia associated with type 2 diabetes mellitus ([place])  - Continue diet/meds, exercise,& lifestyle modifications.  - Continue monitor periodic cholesterol/liver & renal functions   - Lipid panel - TSH  3. Type 2 diabetes mellitus with stage 2 chronic kidney  disease, without long-term current use of insulin (HCC)  -  Continue diet, exercise  - Lifestyle modifications.  - Monitor appropriate labs.  - Hemoglobin A1c - Insulin, random  4. Vitamin D deficiency  - Continue supplementation.  - VITAMIN D 25 Hydroxy   5. Psoriatic arthritis ([place])   6. Crohn's disease without complication ([place])   7. Medication management  - CBC with Differential/Platelet - COMPLETE METABOLIC PANEL WITH GFR - Magnesium - Lipid panel - TSH - Hemoglobin A1c - Insulin, random - VITAMIN D 25 Hydroxy        Discussed  regular exercise, BP monitoring, weight control to achieve/maintain BMI less than 25 and discussed med and SE's. Recommended labs to assess and monitor clinical status with further disposition pending results of labs.  I discussed the assessment and treatment plan with the patient. The patient was provided an opportunity to ask questions and all were answered. The patient  agreed with the plan and demonstrated an understanding of the instructions.  I provided over 30 minutes of exam, counseling, chart review and  complex critical decision making.         The patient was advised to call back or seek an in-person evaluation if the symptoms worsen or if the condition fails to improve as anticipated.   Kirtland Bouchard, MD.

## 2022-05-02 NOTE — Patient Instructions (Signed)
Due to recent changes in healthcare laws, you may see the results of your imaging and laboratory studies on MyChart before your provider has had a chance to review them.  We understand that in some cases there may be results that are confusing or concerning to you. Not all laboratory results come back in the same time frame and the provider may be waiting for multiple results in order to interpret others.  Please give [place] 48 hours in order for your provider to thoroughly review all the results before contacting the office for clarification of your results.  ++++++++++++++++++++++++++  Vit D  & Vit C 1,000 mg   are recommended to help protect  against the Covid-19 and other Corona viruses.    Also it's recommended  to take  Zinc 50 mg  to help  protect against the Covid-19   and best place to get  is also on [email or web address]  and don't pay more than 6-8 cents /pill !   +++++++++++++++++++++++++++++++++++++++ Recommend Adult Low Dose Aspirin or  coated  Aspirin 81 mg daily  To reduce risk of Colon Cancer 40 %,  Skin Cancer 26 % ,  Melanoma 46%  and  Pancreatic cancer 60% +++++++++++++++++++++++++++++++++++++++++ Vitamin D goal  is between 70-100.  Please make sure that you are taking your Vitamin D as directed.  It is very important as a natural anti-inflammatory  helping hair, skin, and nails, as well as reducing stroke and heart attack risk.  It helps your bones and helps with mood. It also decreases numerous cancer risks so please take it as directed.  Low Vit D is associated with a 200-300% higher risk for CANCER  and 200-300% higher risk for HEART   ATTACK  &  STROKE.   .....................................Marland Kitchen It is also associated with higher death rate at younger ages,  autoimmune diseases like Rheumatoid arthritis, Lupus, Multiple Sclerosis.    Also many other serious conditions, like depression, Alzheimer's Dementia, infertility, muscle aches, fatigue, fibromyalgia - just to name  a few. +++++++++++++++++++++++++++++++++++++++++ Recommend the book "The END of DIETING" by Dr Excell Seltzer  & the book "The END of DIABETES " by Dr Excell Seltzer At [email or web address] - get book & Audio CD's    Being diabetic has a  300% increased risk for heart attack, stroke, cancer, and alzheimer- type vascular dementia. It is very important that you work harder with diet by avoiding all foods that are white. Avoid white rice (brown & wild rice is OK), white potatoes (sweetpotatoes in moderation is OK), White bread or wheat bread or anything made out of white flour like bagels, donuts, rolls, buns, biscuits, cakes, pastries, cookies, pizza crust, and pasta (made from white flour & egg whites) - vegetarian pasta or spinach or wheat pasta is OK. Multigrain breads like Arnold's or [employer], or multigrain sandwich thins or flatbreads.  Diet, exercise and weight loss can reverse and cure diabetes in the early stages.  Diet, exercise and weight loss is very important in the control and prevention of complications of diabetes which affects every system in your body, ie. Brain - dementia/stroke, eyes - glaucoma/blindness, heart - heart attack/heart failure, kidneys - dialysis, stomach - gastric paralysis, intestines - malabsorption, nerves - severe painful neuritis, circulation - gangrene & loss of a leg(s), and finally cancer and Alzheimers.    I recommend avoid fried & greasy foods,  sweets/candy, white rice (brown or wild rice or Quinoa is OK), white potatoes (sweet potatoes are OK) - anything  made from white flour - bagels, doughnuts, rolls, buns, biscuits,white and wheat breads, pizza crust and traditional pasta made of white flour & egg white(vegetarian pasta or spinach or wheat pasta is OK).  Multi-grain bread is OK - like multi-grain flat bread or sandwich thins. Avoid alcohol in excess. Exercise is also important.    Eat all the vegetables you want - avoid meat, especially red meat and dairy - especially  cheese.  Cheese is the most concentrated form of trans-fats which is the worst thing to clog up our arteries. Veggie cheese is OK which can be found in the fresh produce section at [employer] or [employer] or [employer]  +++++++++++++++++++++++++++++++++++++++ DASH Eating Plan  DASH stands for "Dietary Approaches to Stop Hypertension."   The DASH eating plan is a healthy eating plan that has been shown to reduce high blood pressure (hypertension). Additional health benefits may include reducing the risk of type 2 diabetes mellitus, heart disease, and stroke. The DASH eating plan may also help with weight loss. WHAT DO I NEED TO KNOW ABOUT THE DASH EATING PLAN? For the DASH eating plan, you will follow these general guidelines: Choose foods with a percent daily value for sodium of less than 5% (as listed on the food label). Use salt-free seasonings or herbs instead of table salt or sea salt. Check with your health care provider or pharmacist before using salt substitutes. Eat lower-sodium products, often labeled as "lower sodium" or "no salt added." Eat fresh foods. Eat more vegetables, fruits, and low-fat dairy products. Choose whole grains. Look for the word "whole" as the first word in the ingredient list. Choose fish  Limit sweets, desserts, sugars, and sugary drinks. Choose heart-healthy fats. Eat veggie cheese  Eat more home-cooked food and less restaurant, buffet, and fast food. Limit fried foods. Cook foods using methods other than frying. Limit canned vegetables. If you do use them, rinse them well to decrease the sodium. When eating at a restaurant, ask that your food be prepared with less salt, or no salt if possible.                      WHAT FOODS CAN I EAT? Read Dr Fara Olden Fuhrman's books on The End of Dieting & The End of Diabetes  Grains Whole grain or whole wheat bread. Brown rice. Whole grain or whole wheat pasta. Quinoa, bulgur, and whole grain cereals. Low-sodium  cereals. Corn or whole wheat flour tortillas. Whole grain cornbread. Whole grain crackers. Low-sodium crackers.  Vegetables Fresh or frozen vegetables (raw, steamed, roasted, or grilled). Low-sodium or reduced-sodium tomato and vegetable juices. Low-sodium or reduced-sodium tomato sauce and paste. Low-sodium or reduced-sodium canned vegetables.   Fruits All fresh, canned (in natural juice), or frozen fruits.  Protein Products  All fish and seafood.  Dried beans, peas, or lentils. Unsalted nuts and seeds. Unsalted canned beans.  Dairy Low-fat dairy products, such as skim or 1% milk, 2% or reduced-fat cheeses, low-fat ricotta or cottage cheese, or plain low-fat yogurt. Low-sodium or reduced-sodium cheeses.  Fats and Oils Tub margarines without trans fats. Light or reduced-fat mayonnaise and salad dressings (reduced sodium). Avocado. Safflower, olive, or canola oils. Natural peanut or almond butter.  Other Unsalted popcorn and pretzels. The items listed above may not be a complete list of recommended foods or beverages. Contact your dietitian for more options.  +++++++++++++++  WHAT FOODS ARE NOT RECOMMENDED? Grains/ White flour or wheat flour White bread. White pasta. White rice. Refined  cornbread. Bagels and croissants. Crackers that contain trans fat.  Vegetables  Creamed or fried vegetables. Vegetables in a . Regular canned vegetables. Regular canned tomato sauce and paste. Regular tomato and vegetable juices.  Fruits Dried fruits. Canned fruit in light or heavy syrup. Fruit juice.  Meat and Other Protein Products Meat in general - RED meat & White meat.  Fatty cuts of meat. Ribs, chicken wings, all processed meats as bacon, sausage, bologna, salami, fatback, hot dogs, bratwurst and packaged luncheon meats.  Dairy Whole or 2% milk, cream, half-and-half, and cream cheese. Whole-fat or sweetened yogurt. Full-fat cheeses or blue cheese. Non-dairy creamers and whipped toppings.  Processed cheese, cheese spreads, or cheese curds.  Condiments Onion and garlic salt, seasoned salt, table salt, and sea salt. Canned and packaged gravies. Worcestershire sauce. Tartar sauce. Barbecue sauce. Teriyaki sauce. Soy sauce, including reduced sodium. Steak sauce. Fish sauce. Oyster sauce. Cocktail sauce. Horseradish. Ketchup and mustard. Meat flavorings and tenderizers. Bouillon cubes. Hot sauce. Tabasco sauce. Marinades. Taco seasonings. Relishes.  Fats and Oils Butter, stick margarine, lard, shortening and bacon fat. Coconut, palm kernel, or palm oils. Regular salad dressings.  Pickles and olives. Salted popcorn and pretzels.  The items listed above may not be a complete list of foods and beverages to avoid.

## 2022-05-03 LAB — COMPLETE METABOLIC PANEL WITH GFR
AG Ratio: 1.6 (calc) (ref 1.0–2.5)
ALT: 18 U/L (ref 9–46)
AST: 16 U/L (ref 10–35)
Albumin: 4.3 g/dL (ref 3.6–5.1)
Alkaline phosphatase (APISO): 31 U/L — ABNORMAL LOW (ref 35–144)
BUN: 22 mg/dL (ref 7–25)
CO2: 25 mmol/L (ref 20–32)
Calcium: 9.9 mg/dL (ref 8.6–10.3)
Chloride: 102 mmol/L (ref 98–110)
Creat: 0.95 mg/dL (ref 0.70–1.28)
Globulin: 2.7 g/dL (calc) (ref 1.9–3.7)
Glucose, Bld: 110 mg/dL — ABNORMAL HIGH (ref 65–99)
Potassium: 4 mmol/L (ref 3.5–5.3)
Sodium: 138 mmol/L (ref 135–146)
Total Bilirubin: 0.5 mg/dL (ref 0.2–1.2)
Total Protein: 7 g/dL (ref 6.1–8.1)
eGFR: 84 mL/min/{1.73_m2} (ref >=60)

## 2022-05-03 LAB — CBC WITH DIFFERENTIAL/PLATELET
Absolute Monocytes: 566 cells/uL (ref 200–950)
Basophils Absolute: 61 cells/uL (ref 0–200)
Basophils Relative: 0.6 %
Eosinophils Absolute: 131 cells/uL (ref 15–500)
Eosinophils Relative: 1.3 %
HCT: 43 % (ref 38.5–50.0)
Hemoglobin: 15.2 g/dL (ref 13.2–17.1)
Lymphs Abs: 4060 cells/uL — ABNORMAL HIGH (ref 850–3900)
MCH: 32.8 pg (ref 27.0–33.0)
MCHC: 35.3 g/dL (ref 32.0–36.0)
MCV: 92.9 fL (ref 80.0–100.0)
MPV: 10.5 fL (ref 7.5–12.5)
Monocytes Relative: 5.6 %
Neutro Abs: 5282 cells/uL (ref 1500–7800)
Neutrophils Relative %: 52.3 %
Platelets: 339 10*3/uL (ref 140–400)
RBC: 4.63 10*6/uL (ref 4.20–5.80)
RDW: 13.1 % (ref 11.0–15.0)
Total Lymphocyte: 40.2 %
WBC: 10.1 10*3/uL (ref 3.8–10.8)

## 2022-05-03 LAB — VITAMIN D 25 HYDROXY (VIT D DEFICIENCY, FRACTURES): Vit D, 25-Hydroxy: 60 ng/mL (ref 30–100)

## 2022-05-03 LAB — LIPID PANEL
Cholesterol: 123 mg/dL (ref <200)
HDL: 57 mg/dL (ref >=40)
LDL Cholesterol (Calc): 40 mg/dL (calc)
Non-HDL Cholesterol (Calc): 66 mg/dL (calc) (ref <130)
Total CHOL/HDL Ratio: 2.2 (calc) (ref <5.0)
Triglycerides: 181 mg/dL — ABNORMAL HIGH (ref <150)

## 2022-05-03 LAB — HEMOGLOBIN A1C
Hgb A1c MFr Bld: 5.4 % of total Hgb (ref <5.7)
Mean Plasma Glucose: 108 mg/dL
eAG (mmol/L): 6 mmol/L

## 2022-05-03 LAB — TSH: TSH: 2.12 mIU/L (ref 0.40–4.50)

## 2022-05-03 LAB — INSULIN, RANDOM: Insulin: 47.7 u[IU]/mL — ABNORMAL HIGH

## 2022-05-03 LAB — MAGNESIUM: Magnesium: 2.1 mg/dL (ref 1.5–2.5)

## 2022-05-04 ENCOUNTER — Encounter: Payer: Self-pay | Admitting: Internal Medicine

## 2022-05-04 NOTE — Progress Notes (Signed)
-    Total  Chol = 123  - Excellent            (  Ideal  or  Goal is less than 180  !  )  & -  Bad / Dangerous LDL  Chol =  40  - also Excellent             (  Ideal  or  Goal is less than 70  !  )    - Very low risk for Heart Attack  / Stroke <><><><><><><><><><><><><><><><><><><><><><><><><><><><><><><><><>  -  A1c - Normal - No Diabetes  - Great !  <><><><><><><><><><><><><><><><><><><><><><><><><><><><><><><><><>  -  Vitamin D = 60 - Great   - Vitamin D goal is between 70-100.   - Please make sure that you are taking your Vitamin D as directed.   - It is very important as a natural anti-inflammatory and helping the  immune system protect against viral infections, like the Covid-19    helping hair, skin, and nails, as well as reducing stroke and  heart attack risk.   - It helps your bones and helps with mood.  - It also decreases numerous cancer risks so please  take it as directed.   - Low Vit D is associated with a 200-300% higher risk for  CANCER   and 200-300% higher risk for HEART   ATTACK  &  STROKE.    - It is also associated with higher death rate at younger ages,   autoimmune diseases like Rheumatoid arthritis, Lupus,  Multiple Sclerosis.     - Also many other serious conditions, like depression, Alzheimer's  Dementia, infertility, muscle aches, fatigue, fibromyalgia   - just to name a few. <><><><><><><><><><><><><><><><><><><><><><><><><><><><><><><><><> <><><><><><><><><><><><><><><><><><><><><><><><><><><><><><><><><>  -  All Else - CBC - Kidneys - Electrolytes - Liver - Magnesium & Thyroid    - all  Normal / OK  <><><><><><><><><><><><><><><><><><><><><><><><><><><><><><><><><> <><><><><><><><><><><><><><><><><><><><><><><><><><><><><><><><><>

## 2022-05-20 DIAGNOSIS — Z79899 Other long term (current) drug therapy: Secondary | ICD-10-CM | POA: Diagnosis not present

## 2022-05-20 DIAGNOSIS — L4050 Arthropathic psoriasis, unspecified: Secondary | ICD-10-CM | POA: Diagnosis not present

## 2022-05-20 DIAGNOSIS — Z5181 Encounter for therapeutic drug level monitoring: Secondary | ICD-10-CM | POA: Diagnosis not present

## 2022-05-20 DIAGNOSIS — L409 Psoriasis, unspecified: Secondary | ICD-10-CM | POA: Diagnosis not present

## 2022-05-21 ENCOUNTER — Other Ambulatory Visit: Payer: Self-pay | Admitting: Internal Medicine

## 2022-05-21 ENCOUNTER — Encounter: Payer: Self-pay | Admitting: Internal Medicine

## 2022-05-21 DIAGNOSIS — E1122 Type 2 diabetes mellitus with diabetic chronic kidney disease: Secondary | ICD-10-CM

## 2022-05-21 MED ORDER — TIRZEPATIDE 15 MG/0.5ML ~~LOC~~ SOAJ: SUBCUTANEOUS | 3 refills | Status: DC

## 2022-05-24 ENCOUNTER — Other Ambulatory Visit: Payer: Self-pay | Admitting: Nurse Practitioner

## 2022-05-24 DIAGNOSIS — R450 Nervousness: Secondary | ICD-10-CM

## 2022-05-29 ENCOUNTER — Encounter: Payer: Self-pay | Admitting: Internal Medicine

## 2022-05-29 DIAGNOSIS — R450 Nervousness: Secondary | ICD-10-CM

## 2022-05-29 MED ORDER — ALPRAZOLAM 0.5 MG PO TABS: ORAL_TABLET | ORAL | 0 refills | Status: DC

## 2022-06-02 ENCOUNTER — Other Ambulatory Visit: Payer: Self-pay | Admitting: Nurse Practitioner

## 2022-06-03 ENCOUNTER — Other Ambulatory Visit: Payer: Self-pay | Admitting: Internal Medicine

## 2022-06-03 DIAGNOSIS — R809 Proteinuria, unspecified: Secondary | ICD-10-CM

## 2022-06-03 DIAGNOSIS — E1129 Type 2 diabetes mellitus with other diabetic kidney complication: Secondary | ICD-10-CM

## 2022-06-03 DIAGNOSIS — I10 Essential (primary) hypertension: Secondary | ICD-10-CM

## 2022-06-03 MED ORDER — LOSARTAN POTASSIUM 50 MG PO TABS: ORAL_TABLET | ORAL | 3 refills | Status: DC

## 2022-06-06 ENCOUNTER — Other Ambulatory Visit: Payer: Self-pay | Admitting: Internal Medicine

## 2022-06-06 ENCOUNTER — Encounter: Payer: Self-pay | Admitting: Internal Medicine

## 2022-06-06 DIAGNOSIS — E1122 Type 2 diabetes mellitus with diabetic chronic kidney disease: Secondary | ICD-10-CM

## 2022-06-06 MED ORDER — TIRZEPATIDE 15 MG/0.5ML ~~LOC~~ SOAJ: SUBCUTANEOUS | 3 refills | Status: DC

## 2022-06-12 ENCOUNTER — Other Ambulatory Visit: Payer: Self-pay | Admitting: Internal Medicine

## 2022-06-12 ENCOUNTER — Encounter: Payer: Self-pay | Admitting: Internal Medicine

## 2022-06-12 MED ORDER — DEXAMETHASONE 4 MG PO TABS: ORAL_TABLET | ORAL | 2 refills | Status: DC

## 2022-07-31 ENCOUNTER — Other Ambulatory Visit: Payer: Self-pay | Admitting: Nurse Practitioner

## 2022-07-31 DIAGNOSIS — R450 Nervousness: Secondary | ICD-10-CM

## 2022-08-05 ENCOUNTER — Other Ambulatory Visit: Payer: Self-pay | Admitting: Nurse Practitioner

## 2022-08-05 ENCOUNTER — Other Ambulatory Visit: Payer: Self-pay | Admitting: Internal Medicine

## 2022-08-05 ENCOUNTER — Encounter: Payer: Self-pay | Admitting: Internal Medicine

## 2022-08-05 DIAGNOSIS — E1122 Type 2 diabetes mellitus with diabetic chronic kidney disease: Secondary | ICD-10-CM

## 2022-08-05 MED ORDER — TIRZEPATIDE 12.5 MG/0.5ML ~~LOC~~ SOAJ: SUBCUTANEOUS | 12 refills | Status: DC

## 2022-08-22 ENCOUNTER — Other Ambulatory Visit: Payer: Self-pay | Admitting: Nurse Practitioner

## 2022-08-22 DIAGNOSIS — F5101 Primary insomnia: Secondary | ICD-10-CM

## 2022-08-27 ENCOUNTER — Ambulatory Visit: Payer: Medicare Other | Admitting: Nurse Practitioner

## 2022-08-27 ENCOUNTER — Other Ambulatory Visit: Payer: Self-pay | Admitting: Internal Medicine

## 2022-08-27 ENCOUNTER — Encounter: Payer: Self-pay | Admitting: Internal Medicine

## 2022-08-27 MED ORDER — AMOXICILLIN 500 MG PO CAPS: ORAL_CAPSULE | ORAL | 0 refills | Status: DC

## 2022-09-09 ENCOUNTER — Ambulatory Visit: Payer: Medicare Other | Admitting: Nurse Practitioner

## 2022-09-16 ENCOUNTER — Ambulatory Visit (INDEPENDENT_AMBULATORY_CARE_PROVIDER_SITE_OTHER): Payer: Medicare Other | Admitting: Nurse Practitioner

## 2022-09-16 ENCOUNTER — Encounter: Payer: Self-pay | Admitting: Nurse Practitioner

## 2022-09-16 VITALS — BP 130/80 | HR 84 | Temp 97.8°F | Resp 17 | Ht 69.0 in | Wt 185.6 lb

## 2022-09-16 DIAGNOSIS — E559 Vitamin D deficiency, unspecified: Secondary | ICD-10-CM | POA: Diagnosis not present

## 2022-09-16 DIAGNOSIS — K760 Fatty (change of) liver, not elsewhere classified: Secondary | ICD-10-CM | POA: Diagnosis not present

## 2022-09-16 DIAGNOSIS — M810 Age-related osteoporosis without current pathological fracture: Secondary | ICD-10-CM | POA: Diagnosis not present

## 2022-09-16 DIAGNOSIS — K5090 Crohn's disease, unspecified, without complications: Secondary | ICD-10-CM

## 2022-09-16 DIAGNOSIS — I10 Essential (primary) hypertension: Secondary | ICD-10-CM | POA: Diagnosis not present

## 2022-09-16 DIAGNOSIS — E1122 Type 2 diabetes mellitus with diabetic chronic kidney disease: Secondary | ICD-10-CM | POA: Diagnosis not present

## 2022-09-16 DIAGNOSIS — Z86010 Personal history of colonic polyps: Secondary | ICD-10-CM | POA: Diagnosis not present

## 2022-09-16 DIAGNOSIS — R6889 Other general symptoms and signs: Secondary | ICD-10-CM | POA: Diagnosis not present

## 2022-09-16 DIAGNOSIS — Z1211 Encounter for screening for malignant neoplasm of colon: Secondary | ICD-10-CM

## 2022-09-16 DIAGNOSIS — Z79899 Other long term (current) drug therapy: Secondary | ICD-10-CM

## 2022-09-16 DIAGNOSIS — G4700 Insomnia, unspecified: Secondary | ICD-10-CM

## 2022-09-16 DIAGNOSIS — N182 Chronic kidney disease, stage 2 (mild): Secondary | ICD-10-CM

## 2022-09-16 DIAGNOSIS — E663 Overweight: Secondary | ICD-10-CM

## 2022-09-16 DIAGNOSIS — E785 Hyperlipidemia, unspecified: Secondary | ICD-10-CM | POA: Diagnosis not present

## 2022-09-16 DIAGNOSIS — L4050 Arthropathic psoriasis, unspecified: Secondary | ICD-10-CM

## 2022-09-16 DIAGNOSIS — E1169 Type 2 diabetes mellitus with other specified complication: Secondary | ICD-10-CM | POA: Diagnosis not present

## 2022-09-16 DIAGNOSIS — E1129 Type 2 diabetes mellitus with other diabetic kidney complication: Secondary | ICD-10-CM | POA: Diagnosis not present

## 2022-09-16 DIAGNOSIS — Z0001 Encounter for general adult medical examination with abnormal findings: Secondary | ICD-10-CM

## 2022-09-16 DIAGNOSIS — Z0000 Encounter for general adult medical examination without abnormal findings: Secondary | ICD-10-CM

## 2022-09-16 DIAGNOSIS — R809 Proteinuria, unspecified: Secondary | ICD-10-CM

## 2022-09-16 NOTE — Progress Notes (Signed)
MEDICARE ANNUAL WELLNESS VISIT AND FOLLOW UP  Assessment:   Aleksi was seen today for follow-up and medicare wellness.  Diagnoses and all orders for this visit:  Annual Medicare Wellness Visit Due annually  Health maintenance reviewed  Essential hypertension Discussed DASH (Dietary Approaches to Stop Hypertension) DASH diet is lower in sodium than a typical American diet. Cut back on foods that are high in saturated fat, cholesterol, and trans fats. Eat more whole-grain foods, fish, poultry, and nuts Remain active and exercise as tolerated daily.  Monitor BP at home-Call if greater than 130/80.  Check CMP/CBC   Mixed hyperlipidemia associated with T2DM (HCC) Discussed lifestyle modifications. Recommended diet heavy in fruits and veggies, omega 3's. Decrease consumption of animal meats, cheeses, and dairy products. Remain active and exercise as tolerated. Continue to monitor. Check lipids/TSH  Type 2 diabetes mellitus with stage 2 chronic kidney disease, without long-term current use of insulin [facility]) Education: Reviewed 'ABCs' of diabetes management  Discussed goals to be met and/or maintained include A1C (<7) Blood pressure (<130/80) Cholesterol (LDL <70) Continue Eye Exam yearly  Continue Dental Exam Q6 mo Discussed dietary recommendations Discussed Physical Activity recommendations Foot exam UTD Check A1C  CKD 2 associated with T2DM (HCC) Discussed how what you eat and drink can aide in kidney protection. Stay well hydrated. Avoid high salt foods. Avoid NSAIDS. Keep BP and BG well controlled.   Take medications as prescribed. Remain active and exercise as tolerated daily. Maintain weight.  Continue to monitor. Check CMP/GFR/Microablumin  Microalbuminuria associated with T2DM (HCC) Continue to monitor  Psoriatic arthritis (HCC) Followed by Dr. Gibson Ramp Risankizumab injections Q12 weeks  Osteoporosis without current pathological fracture, unspecified  osteoporosis type Patients with severe mobility impairment should be referred for physical therapy. Advised on fall prevention measures including proper lighting in all rooms, removal of area rugs and floor clutter, use of walking devices as deemed appropriate, avoidance of uneven walking surfaces. Smoking cessation and moderate alcohol consumption if applicable Consume 800 to 1000 IU of vitamin D daily with a goal vitamin D serum value of 30 ng/mL or higher. Aim for 1000 to 1200 mg of elemental calcium daily through supplements and/or dietary sources.  Vitamin D deficiency Continue supplement for goal of 60-100 Monitor Vitamin D levels  Hx of colonic polyp Overdue Order placed  Overweight  Discussed appropriate BMI Diet modification. Physical activity. Encouraged/praised to build confidence.   Primary insomnia Discussed good sleep hygiene. Establish bed and wake times. Sleep restriction-only sleep estimated hrs sleep. Bed only for sex and sleep, only sleep when sleepy, out of bed if anxious (stimulus control). Reviewed relaxation techniques, mindful meditations. Expected sleep duration. Addressed worries about not sleeping.     Crohn's disease without complication, unspecified gastrointestinal tract location [facility]) Doing well at this time Previous medication induced.   Resolved at last colonoscopy Monitor; denies sx at this time  Fatty liver Weight loss with low processed carbohydrate diet recommended Limit tylenol/alcohol Monitor LFT trend and follow up US if trending up  Medication management All medications discussed and reviewed in full. All questions and concerns regarding medications addressed.    Screening for colon cancer Refer to GI  Orders Placed This Encounter  Procedures   CBC with Differential/Platelet   COMPLETE METABOLIC PANEL WITH GFR   Lipid panel   Hemoglobin A1c   VITAMIN D 25 Hydroxy (Vit-D Deficiency, Fractures)   Ambulatory referral to  Gastroenterology    Referral Priority:   Routine    Referral Type:  Consultation    Referral Reason:   Specialty Services Required    Number of Visits Requested:   1    Notify office for further evaluation and treatment, questions or concerns if any reported s/s fail to improve.   The patient was advised to call back or seek an in-person evaluation if any symptoms worsen or if the condition fails to improve as anticipated.   Further disposition pending results of labs. Discussed med's effects and SE's.    I discussed the assessment and treatment plan with the patient. The patient was provided an opportunity to ask questions and all were answered. The patient agreed with the plan and demonstrated an understanding of the instructions.  Discussed med's effects and SE's. Screening labs and tests as requested with regular follow-up as recommended.  I provided 35 minutes of face-to-face time during this encounter including counseling, chart review, and critical decision making was preformed.  Today's Plan of Care is based on a patient-centered health care approach known as shared decision making - the decisions, tests and treatments allow for patient preferences and values to be balanced with clinical evidence.     Future Appointments  Date Time Provider Department Center  12/16/2022  4:00 PM Lucky Cowboy, MD [facility] None  02/27/2023  3:00 PM Lucky Cowboy, MD [facility] None  08/26/2023  4:00 PM Adela Glimpse, NP [facility] None    Plan:   During the course of the visit the patient was educated and counseled about appropriate screening and preventive services including:   Pneumococcal vaccine                           Influenza vaccine Prevnar 13 Td vaccine Screening electrocardiogram Colorectal cancer screening Diabetes screening Glaucoma screening Nutrition counseling    Subjective:  Melvin Shelton is a 77 y.o. male who presents for Medicare Annual Wellness  Visit and 3 month follow up. has Hypertension; Psoriatic arthritis; Hyperlipidemia associated with type 2 diabetes mellitus; Osteoporosis; Fatty liver; Hx of colonic polyp; Medication management; Vitamin D deficiency; Encounter for Medicare annual wellness exam; Multiple atypical nevi; Crohn disease; Overweight (BMI 25.0-29.9); T2DM (type 2 diabetes mellitus); CKD stage 2 due to type 2 diabetes mellitus; Insomnia; and Microalbuminuria due to type 2 diabetes mellitus on their problem list.  Overall he reports feeling well today.  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 Continues to play in concerts and making music.  [name] Patient has long standing [place] Psoriasis & Psoriatic Arthritis in remission on Cosentyx, but 07/2017 had Colonoscopy by Dr Myrtie Neither with  Bx (+) Crohn's in the TI, so Cosentyx has been discontinued.  He is taking Risankizumab injections Q12 weeks, follows with Dr Gibson Ramp.   He is overdue for follow up colonoscopy, last was 05/20/2018 and was recommended for 1 year follow up, overdue.  BMI is Body mass index is 27.41 kg/m., he has been working on diet and exercise.  Wt Readings from Last 3 Encounters:  09/16/22 185 lb 9.6 oz (84.2 kg)  05/02/22 180 lb 12.8 oz (82 kg)  01/24/22 178 lb 6.4 oz (80.9 kg)   His blood pressure has been controlled at home, today their BP is BP: 130/80  He does not workout, is on tour so it makes it difficult.  He denies chest pain, shortness of breath, dizziness.   He is on cholesterol medication, rosuvastatin  and fenofibrate  and denies myalgias. His  cholesterol is at goal, LDL <70. The cholesterol last visit was:   Lab Results  Component Value Date   CHOL 141 09/16/2022   HDL 41 09/16/2022   LDLCALC 68 09/16/2022   TRIG 228 (H) 09/16/2022   CHOLHDL 3.4 09/16/2022    He has been working on diet and exercise for DMII with CKD II on metformin and mounjaro 7.5 mg/week, on statin, ASA 81  mg and denies foot ulcerations, increased appetite, nausea, paresthesia of the feet, polydipsia, polyuria, visual disturbances, vomiting and weight loss. Last A1C in the office was:  Lab Results  Component Value Date   HGBA1C 5.6 09/16/2022   CKD II associated with T2DM monitored at this office. Not on ARB, has been on amlodipine 2.5 mg daily for many years. Last GFR:  Lab Results  Component Value Date   GFRNONAA 85 08/22/2020   Does have microalbuminuria trending up, not currently on ACEi/ARB, receptive to starting-  Lab Results  Component Value Date   MICRALBCREAT 74 (H) 01/24/2022   MICRALBCREAT 104 (H) 10/17/2021   MICRALBCREAT 133 (H) 08/22/2021    Patient is on Vitamin D supplement.   Lab Results  Component Value Date   VD25OH 69 09/16/2022     Has hx of fatty liver Lab Results  Component Value Date   ALT 21 09/16/2022   AST 23 09/16/2022   ALKPHOS 42 12/10/2016   BILITOT 0.8 09/16/2022     Medication Review: Current Outpatient Medications on File Prior to Visit  Medication Sig Dispense Refill   ALPRAZolam (XANAX) 0.5 MG tablet TAKE 1/2 TO 1 TABLET BY MOUTH EVERY NIGHT AT BEDTIME ONLY AS NEEDED FOR SLEEP(LIMIT TO 5 DAYS PER WEEK TO AVOID ADDICTION AND DEMENTIA) 30 tablet 0   amoxicillin (AMOXIL) 500 MG capsule Take  1 capsule  3 x /day  for Dental Infection 30 capsule 0   aspirin 81 MG tablet Take 81 mg by mouth daily.     Calcium Carbonate (CALCIUM 600 PO) Take 600 mg by mouth daily.     Cholecalciferol (VITAMIN D) 125 MCG (5000 UT) CAPS Take by mouth.     fenofibrate 160 MG tablet Take  1 tablet Daily for Triglycerides (Blood Fats)                                  /                                          TAKE                                             BY                               MOUTH 90 tablet 3   losartan (COZAAR) 50 MG tablet Take  1 tablet  Daily  for BP & Diabetic  Kidney protection 90 tablet 3   Magnesium 500 MG CAPS Take 500 mg by mouth daily.      metFORMIN (GLUCOPHAGE-XR) 500 MG 24 hr tablet Take  2 tablets  2 x /day  with Meals  for Diabetes                                                               /  TAKE                                                      BY                                           MOUTH 360 tablet 3   Omega-3 Fatty Acids (FISH OIL) 1200 MG CAPS Take by mouth daily.      OVER THE COUNTER MEDICATION Vitamin C. 500 mg. One tablet daily.     Risankizumab-rzaa,150 MG Dose, 75 MG/0.83ML PSKT Inject into the skin. Inject every 12 weeks     tirzepatide (MOUNJARO) 12.5 MG/0.5ML Pen Inject  1 pen (12.5 mg)  into Skin  every 7 days  for Diabetes  ( e11.29) 2 mL 12   traZODone (DESYREL) 150 MG tablet TAKE 1 TABLET BY MOUTH EVERY NIGHT 1 HOUR BEFORE BEDTIME AS NEEDED FOR SLEEP 90 tablet 3   No current facility-administered medications on file prior to visit.    Allergies: No Known Allergies  Current Problems (verified) has Hypertension; Psoriatic arthritis; Hyperlipidemia associated with type 2 diabetes mellitus; Osteoporosis; Fatty liver; Hx of colonic polyp; Medication management; Vitamin D deficiency; Encounter for Medicare annual wellness exam; Multiple atypical nevi; Crohn disease; Overweight (BMI 25.0-29.9); T2DM (type 2 diabetes mellitus); CKD stage 2 due to type 2 diabetes mellitus; Insomnia; and Microalbuminuria due to type 2 diabetes mellitus on their problem list.  Screening Tests Immunization History  Administered Date(s) Administered   DT (Pediatric) 03/17/2014   Fluad Quad(high Dose 65+) 03/03/2022   Influenza, High Dose Seasonal PF 06/14/2021   Moderna Sars-Covid-2 Vaccination 07/16/2019, 08/13/2019, 01/22/2020   PPD Test 06/13/2014, 07/19/2015, 07/15/2016, 01/22/2017   Pfizer Covid-19 Vaccine Bivalent Booster 14yrs & up 03/03/2022   Pneumococcal Conjugate-13 09/04/2016   Pneumococcal Polysaccharide-23 12/10/2017   Pneumococcal-Unspecified  06/04/2003   Health Maintenance  Topic Date Due   OPHTHALMOLOGY EXAM  Never done   Zoster Vaccines- Shingrix (1 of 2) Never done   COLONOSCOPY (Pts 45-53yrs Insurance coverage will need to be confirmed)  05/21/2019   FOOT EXAM  01/24/2022   COVID-19 Vaccine (5 - 2023-24 season) 04/28/2022   INFLUENZA VACCINE  01/02/2023   Diabetic kidney evaluation - Urine ACR  01/25/2023   HEMOGLOBIN A1C  03/18/2023   Diabetic kidney evaluation - eGFR measurement  09/16/2023   Medicare Annual Wellness (AWV)  09/16/2023   DTaP/Tdap/Td (2 - Tdap) 03/17/2024   Pneumonia Vaccine 28+ Years old  Completed   Hepatitis C Screening  Completed   HPV VACCINES  Aged Out   Last colonoscopy: 2019 (after positive cologard). Dr. Myrtie Neither, 1 year recall recommended, OVERDUE  Influenza: declines Shingles: declines  Names of Other Physician/Practitioners you currently use: 1. [facility] here for primary care 2. Has not gotten checked lately, eye doctor, last visit 2020, states will schedule  3. Does not see, dentist, last visit 2023   Patient Care Team: Lucky Cowboy, MD as PCP - General (Internal Medicine) Louis Meckel, MD (Inactive) as Consulting Physician (Gastroenterology) Vernia Buff, MD as Consulting Physician Charlett Nose, [facility] (Inactive) as Pharmacist (Pharmacist)  Surgical: He  has a past surgical history that includes Colonoscopy; Polypectomy; and Tonsillectomy.  Family His family history includes Diabetes in his father; Heart disease in his father; Hypertension in his father; Lymphoma in his mother; Prostate cancer in his father. Social history  He reports that he has never smoked. He has never used smokeless tobacco. He reports current alcohol use. He reports that he does not use drugs.  MEDICARE WELLNESS OBJECTIVES: Physical activity:   Cardiac risk factors:   Depression/mood screen:      09/16/2022    3:53 PM  Depression screen PHQ 2/9   Decreased Interest 0  Down, Depressed, Hopeless 0  PHQ - 2 Score 0    ADLs:     09/16/2022    3:47 PM 05/04/2022   11:12 PM  In your present state of health, do you have any difficulty performing the following activities:  Hearing? 0 0  Vision? 0 0  Difficulty concentrating or making decisions? 0 0  Walking or climbing stairs? 0 0  Dressing or bathing? 0 0  Doing errands, shopping? 0 0  Preparing Food and eating ? N   Using the Toilet? N   In the past six months, have you accidently leaked urine? N   Do you have problems with loss of bowel control? N   Managing your Medications? N   Managing your Finances? N   Housekeeping or managing your Housekeeping? N      Cognitive Testing  Alert? Yes  Normal Appearance?Yes  Oriented to person? Yes  Place? Yes   Time? Yes  Recall of three objects?  Yes  Can perform simple calculations? Yes  Displays appropriate judgment?Yes  Can read the correct time from a watch face?Yes  EOL planning:   No- declines papers Discussed with patient  Objective:   Today's Vitals   09/16/22 1507  BP: 130/80  Pulse: 84  Resp: 17  Temp: 97.8 F (36.6 C)  SpO2: 97%  Weight: 185 lb 9.6 oz (84.2 kg)  Height: 5\' 9"  (1.753 m)    Body mass index is 27.41 kg/m.  Wt Readings from Last 3 Encounters:  09/16/22 185 lb 9.6 oz (84.2 kg)  05/02/22 180 lb 12.8 oz (82 kg)  01/24/22 178 lb 6.4 oz (80.9 kg)   General appearance: alert, no distress, WD/WN, male HEENT: normocephalic, sclerae anicteric, TMs pearly, nares patent, no discharge or erythema, pharynx normal Oral cavity: MMM, no lesions Neck: supple, no lymphadenopathy, no thyromegaly, no masses Heart: RRR, normal S1, S2, no murmurs Lungs: CTA bilaterally, no wheezes, rhonchi, or rales Abdomen: +BS, obese soft, non tender, non distended, no masses, no hepatomegaly, no splenomegaly Musculoskeletal: nontender, no swelling; obvious bony joint deformity/enlargement of DIP joints of bilateral hands  and CMP joints without effusion or heat.  Extremities: no edema, no cyanosis, no clubbing Pulses: 2+ symmetric, upper and lower extremities, normal cap refill Neurological: alert, oriented x 3, CN2-12 intact, strength normal upper extremities and lower extremities, sensation normal throughout, DTRs 2+ throughout, no cerebellar signs, gait normal Psychiatric: normal affect, behavior normal, pleasant   Medicare Attestation I have personally reviewed: The patient's medical and social history Their use of alcohol, tobacco or illicit drugs Their current medications and supplements The patient's functional ability including ADLs,fall risks, home safety risks, cognitive, and hearing and visual impairment Diet and physical activities Evidence for depression or mood disorders  The patient's weight, height, BMI, and visual acuity have been recorded in the chart.  I have made referrals, counseling, and provided education to the patient based on review of the above and I  have provided the patient with a written personalized care plan for preventive services.     Adela Glimpse, NP   08/22/2020

## 2022-09-17 LAB — LIPID PANEL
Cholesterol: 141 mg/dL (ref <200)
HDL: 41 mg/dL (ref >=40)
LDL Cholesterol (Calc): 68 mg/dL (calc)
Non-HDL Cholesterol (Calc): 100 mg/dL (calc) (ref <130)
Total CHOL/HDL Ratio: 3.4 (calc) (ref <5.0)
Triglycerides: 228 mg/dL — ABNORMAL HIGH (ref <150)

## 2022-09-17 LAB — CBC WITH DIFFERENTIAL/PLATELET
Absolute Monocytes: 617 cells/uL (ref 200–950)
Basophils Absolute: 59 cells/uL (ref 0–200)
Basophils Relative: 0.6 %
Eosinophils Absolute: 88 cells/uL (ref 15–500)
Eosinophils Relative: 0.9 %
HCT: 41.6 % (ref 38.5–50.0)
Hemoglobin: 14.6 g/dL (ref 13.2–17.1)
Lymphs Abs: 2636 cells/uL (ref 850–3900)
MCH: 32.1 pg (ref 27.0–33.0)
MCHC: 35.1 g/dL (ref 32.0–36.0)
MCV: 91.4 fL (ref 80.0–100.0)
MPV: 10.6 fL (ref 7.5–12.5)
Monocytes Relative: 6.3 %
Neutro Abs: 6399 cells/uL (ref 1500–7800)
Neutrophils Relative %: 65.3 %
Platelets: 372 10*3/uL (ref 140–400)
RBC: 4.55 10*6/uL (ref 4.20–5.80)
RDW: 13 % (ref 11.0–15.0)
Total Lymphocyte: 26.9 %
WBC: 9.8 10*3/uL (ref 3.8–10.8)

## 2022-09-17 LAB — COMPLETE METABOLIC PANEL WITH GFR
AG Ratio: 1.5 (calc) (ref 1.0–2.5)
ALT: 21 U/L (ref 9–46)
AST: 23 U/L (ref 10–35)
Albumin: 4.3 g/dL (ref 3.6–5.1)
Alkaline phosphatase (APISO): 27 U/L — ABNORMAL LOW (ref 35–144)
BUN: 12 mg/dL (ref 7–25)
CO2: 22 mmol/L (ref 20–32)
Calcium: 9.7 mg/dL (ref 8.6–10.3)
Chloride: 106 mmol/L (ref 98–110)
Creat: 0.92 mg/dL (ref 0.70–1.28)
Globulin: 2.8 g/dL (calc) (ref 1.9–3.7)
Glucose, Bld: 91 mg/dL (ref 65–99)
Potassium: 4.1 mmol/L (ref 3.5–5.3)
Sodium: 139 mmol/L (ref 135–146)
Total Bilirubin: 0.8 mg/dL (ref 0.2–1.2)
Total Protein: 7.1 g/dL (ref 6.1–8.1)
eGFR: 87 mL/min/{1.73_m2} (ref >=60)

## 2022-09-17 LAB — HEMOGLOBIN A1C
Hgb A1c MFr Bld: 5.6 % of total Hgb (ref <5.7)
Mean Plasma Glucose: 114 mg/dL
eAG (mmol/L): 6.3 mmol/L

## 2022-09-17 LAB — VITAMIN D 25 HYDROXY (VIT D DEFICIENCY, FRACTURES): Vit D, 25-Hydroxy: 69 ng/mL (ref 30–100)

## 2022-09-18 ENCOUNTER — Encounter: Payer: Self-pay | Admitting: Nurse Practitioner

## 2022-09-18 DIAGNOSIS — E782 Mixed hyperlipidemia: Secondary | ICD-10-CM

## 2022-09-18 MED ORDER — ROSUVASTATIN CALCIUM 20 MG PO TABS: ORAL_TABLET | ORAL | 3 refills | Status: AC

## 2022-09-22 NOTE — Patient Instructions (Signed)

## 2022-09-24 ENCOUNTER — Encounter: Payer: Self-pay | Admitting: Nurse Practitioner

## 2022-09-24 DIAGNOSIS — E782 Mixed hyperlipidemia: Secondary | ICD-10-CM

## 2022-09-24 MED ORDER — FENOFIBRATE 160 MG PO TABS
ORAL_TABLET | ORAL | 3 refills | Status: AC
Start: 2022-09-24 — End: ?

## 2022-09-30 ENCOUNTER — Other Ambulatory Visit: Payer: Self-pay | Admitting: Nurse Practitioner

## 2022-09-30 DIAGNOSIS — R450 Nervousness: Secondary | ICD-10-CM

## 2022-11-18 ENCOUNTER — Other Ambulatory Visit: Payer: Self-pay | Admitting: Nurse Practitioner

## 2022-11-18 DIAGNOSIS — R450 Nervousness: Secondary | ICD-10-CM

## 2022-11-18 MED ORDER — MOUNJARO 7.5 MG/0.5ML ~~LOC~~ SOAJ: 7.5000 mg | SUBCUTANEOUS | 2 refills | Status: AC

## 2022-11-27 ENCOUNTER — Ambulatory Visit: Payer: Medicare Other | Admitting: Internal Medicine

## 2022-12-16 ENCOUNTER — Encounter: Payer: Self-pay | Admitting: Internal Medicine

## 2022-12-16 ENCOUNTER — Ambulatory Visit: Payer: Medicare Other | Admitting: Internal Medicine

## 2022-12-16 NOTE — Patient Instructions (Signed)

## 2022-12-16 NOTE — Progress Notes (Signed)
C  A  N  C  E L  L  E  D  After[facility]'t                                       Future Appointments  Date Time Provider Department  12/16/2022                            ov  4:00 PM Lucky Cowboy, MD [facility]  02/27/2023                            cpe  3:00 PM Lucky Cowboy, MD [facility]  08/26/2023                            wellness   4:00 PM Adela Glimpse, NP [facility]     History of Present Illness:       This very nice 77 y.o. MWM presents for 3  month follow up with HTN, HLD, T2_NIDDM, Psoriatic Arthritis and Vitamin D Deficiency.    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  He is taking Risankizumab injections Q12weeks and follows with his dermatologist  - Dr Feldman.]]         Patient is treated for HTN (2013) & BP has been controlled at home. Today's BP is at goal -                           .  Patient has had no complaints of any cardiac type chest pain, palpitations, dyspnea Pollyann Kennedy /PND, dizziness, claudication or dependent edema.        Hyperlipidemia is controlled with diet & Rosuvastatin. Patient denies myalgias or other med SE's. Last Lipids were at goal except elevated Trig's:  Lab Results  Component Value  Date   CHOL 141 09/16/2022   HDL 41 09/16/2022   LDLCALC 68 09/16/2022   TRIG 228 (H) 09/16/2022   CHOLHDL 3.4 09/16/2022     Also, the patient has history of T2_NIDDM (A1c  6.5% /2012) w/CKD2  (GFR 87 ) and he managed with diet until required starting Metformin in Feb 2021.  Patient has had no symptoms of reactive hypoglycemia, diabetic polys, paresthesias or visual blurring.  Last A1c was  at goal:  Lab Results  Component Value Date   HGBA1C 5.6 09/16/2022         Further, the patient also has history of Vitamin D Deficiency and supplements vitamin D without any suspected side-effects. Last vitamin D was at goal:  Lab Results  Component Value Date   VD25OH 69 09/16/2022       Current Outpatient Medications  Medication Instructions   ALPRAZolam (XANAX) 0.5 MG tablet TAKE 1/2 TO 1 TABLET AT BEDTIME ONLY AS NEEDED    Aspirin  81 mg Daily   CALCIUM 600 Daily   VITAMIN D  5000 u  Daily    fenofibrate 160 MG tablet Take  1 tablet Daily f   losartan (COZAAR) 50 MG tablet Take 1 tab daily for blood pressure and diabetes kidney protection.   Magnesium 500 mg, Daily   metFORMIN -XR) 500 MG  TAKE 2 TABLETS  TWICE DAILY WITH MEALS    Mounjaro   12.5 mg Weekly   Omega-3 FISH OIL 1200 MG CAPS Daily   Vitamin C. 500 mg One tablet daily.    Risankizumab-rzaa,  150 MG , 75 MG/0.83ML   Inject every 12 weeks   rosuvastatin  20 MG tablet TAKE 1 TABLET(20 MG) BY MOUTH AT BEDTIME   traZODone 150 MG tablet TAKE 1 TABLET BEFORE BEDTIME      No Known Allergies  PMHx:   Past Medical History:  Diagnosis Date   Arthritis    Crohn's colitis (HCC)    Elevated LFTs    Hx of colonic polyp    Hyperlipidemia    Hypertension    Hypogonadism male    Osteoporosis    Psoriatic arthritis (HCC)    Renal glycosuria (HCC)     Immunization History  Administered Date(s) Administered   DT (Pediatric) 03/17/2014   Moderna Sars-Covid-2 Vaccination 08/13/2019, 01/22/2020   PPD Test 07/15/2016,  01/22/2017   Pneumococcal Conjugate-13 09/04/2016   Pneumococcal Polysaccharide-23 12/10/2017   Pneumococcal-Unspecified 06/04/2003    Past Surgical History:  Procedure Laterality Date   COLONOSCOPY     POLYPECTOMY     TONSILLECTOMY     age 11-5     FHx:    Reviewed / unchanged  SHx:    Reviewed / unchanged   Systems Review:  Constitutional: Denies fever, chills, wt changes, headaches, insomnia, fatigue, night sweats, change in appetite. Eyes: Denies redness, blurred vision, diplopia, discharge, itchy, watery eyes.  ENT: Denies discharge, congestion, post nasal drip, epistaxis, sore throat, earache, hearing loss, dental pain, tinnitus, vertigo, sinus pain, snoring.  CV: Denies chest pain, palpitations, irregular heartbeat, syncope, dyspnea, diaphoresis, orthopnea, PND, claudication or edema. Respiratory: denies cough, dyspnea, DOE, pleurisy, hoarseness, laryngitis, wheezing.  Gastrointestinal: Denies dysphagia, odynophagia, heartburn, reflux, water brash, abdominal pain or cramps, nausea, vomiting, bloating, diarrhea, constipation, hematemesis, melena, hematochezia  or hemorrhoids. Genitourinary: Denies dysuria, frequency, urgency, nocturia, hesitancy, discharge, hematuria or flank pain. Musculoskeletal: Denies arthralgias, myalgias, stiffness, jt. swelling, pain, limping or strain/sprain.  Skin: Denies pruritus, rash, hives, warts, acne, eczema or change in skin lesion(s). Neuro: No weakness, tremor, incoordination, spasms, paresthesia or pain. Psychiatric: Denies confusion, memory loss or sensory loss. Endo: Denies change in weight, skin or hair change.  Heme/Lymph: No excessive bleeding, bruising or enlarged lymph nodes.  Physical Exam  There were no vitals taken for this visit.  Appears  well nourished, well groomed  and in no distress.  Eyes: PERRLA, EOMs, conjunctiva no swelling or erythema. Sinuses: No frontal/maxillary tenderness ENT/Mouth: EAC's clear, TM's nl w/o  erythema, bulging. Nares clear w/o erythema, swelling, exudates. Oropharynx clear without erythema or exudates. Oral hygiene is good. Tongue normal, non obstructing. Hearing intact.  Neck: Supple. Thyroid not palpable. Car 2+/2+ without bruits, nodes or JVD. Chest: Respirations nl with BS clear & equal w/o rales, rhonchi, wheezing or stridor.  Cor: Heart sounds normal w/ regular rate and rhythm without sig. murmurs, gallops, clicks or rubs. Peripheral pulses normal and equal  without edema.  Abdomen: Soft & bowel sounds normal. Non-tender w/o guarding, rebound, hernias, masses or organomegaly.  Lymphatics: Unremarkable.  Musculoskeletal: Full ROM all peripheral extremities, joint stability, 5/5 strength and normal gait.  Skin: Warm, dry without exposed rashes, lesions or ecchymosis apparent.  Neuro: Cranial nerves intact, reflexes equal bilaterally. Sensory-motor testing grossly intact. Tendon reflexes grossly intact.  Pysch: Alert & oriented x 3.  Insight and judgement nl & appropriate. No ideations.  Assessment and Plan:  1. Essential hypertension  - Continue medication, monitor blood pressure at home.  - Continue DASH diet.  Reminder to go to the ER if any CP,  SOB, nausea, dizziness, severe HA, changes vision/speech.  - CBC with Differential/Platelet - COMPLETE METABOLIC PANEL WITH GFR - Magnesium - TSH  2. Hyperlipidemia associated with type 2 diabetes mellitus (HCC)  - Continue diet/meds, exercise,& lifestyle modifications.  - Continue monitor periodic cholesterol/liver & renal functions   - Lipid panel - TSH  3. Type 2 diabetes mellitus with stage 2 chronic kidney  disease, without long-term current use of insulin (HCC)  - Continue diet, exercise  - Lifestyle modifications.  - Monitor appropriate labs.  - Hemoglobin A1c - Insulin, random  4. Vitamin D deficiency  - Continue supplementation.  - VITAMIN D 25 Hydroxy   5. Psoriatic arthritis (HCC)   6. Crohn's  disease without complication (HCC)   7. Medication management  - CBC with Differential/Platelet - COMPLETE METABOLIC PANEL WITH GFR - Magnesium - Lipid panel - TSH - Hemoglobin A1c - Insulin, random - VITAMIN D 25 Hydroxy        Discussed  regular exercise, BP monitoring, weight control to achieve/maintain BMI less than 25 and discussed med and SE's. Recommended labs to assess and monitor clinical status with further disposition pending results of labs.  I discussed the assessment and treatment plan with the patient. The patient was provided an opportunity to ask questions and all were answered. The patient agreed with the plan and demonstrated an understanding of the instructions.  I provided over 30 minutes of exam, counseling, chart review and  complex critical decision making.         The patient was advised to call back or seek an in-person evaluation if  the symptoms worsen or if the condition fails to improve as anticipated.   Marinus Maw, MD.

## 2022-12-29 ENCOUNTER — Encounter: Payer: Self-pay | Admitting: Internal Medicine

## 2022-12-29 NOTE — Patient Instructions (Signed)

## 2022-12-29 NOTE — Progress Notes (Unsigned)
Future Appointments  Date Time Provider Department  12/29/2022                            ov  4:00 PM Lucky Cowboy, MD [facility]  02/27/2023                            cpe  3:00 PM Lucky Cowboy, MD [facility]  08/26/2023                            wellness   4:00 PM Adela Glimpse, NP [facility]    History of Present Illness:       This very nice 77 y.o. MWM presents for 3  month follow up with HTN, HLD, T2_NIDDM, Psoriatic Arthritis and Vitamin D Deficiency.    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  He is taking Risankizumab injections Q12weeks and follows with his dermatologist  - Dr Feldman.]]         Patient is treated for HTN (2013) & BP has been controlled at home. Today's BP is at goal -  134/86 . Patient has had no complaints of any cardiac type chest pain, palpitations, dyspnea Pollyann Kennedy /PND, dizziness, claudication or dependent edema.        Hyperlipidemia is controlled with diet & Rosuvastatin. Patient denies myalgias or other med SE's. 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  Patient has had no symptoms of reactive hypoglycemia, diabetic polys, paresthesias or visual blurring.  Last A1c was  at goal:  Lab Results  Component Value Date   HGBA1C 5.6 09/16/2022         Further, the patient also has history of Vitamin D Deficiency and supplements vitamin D without any suspected side-effects. Last vitamin D was at goal:  Lab Results  Component Value Date   VD25OH 69  09/16/2022       Current Outpatient Medications  Medication Instructions   ALPRAZolam (XANAX) 0.5 MG tablet TAKE 1/2 TO 1 TABLET AT BEDTIME ONLY AS NEEDED    Aspirin  81 mg Daily   CALCIUM 600 Daily   VITAMIN D  5000 u  Daily    fenofibrate 160 MG tablet Take  1 tablet Daily f   losartan (COZAAR) 50 MG tablet Take 1 tab daily for blood pressure and diabetes kidney protection.   Magnesium 500 mg, Daily   metFORMIN -XR 500 MG  TAKE 2 TABLETS  TWICE DAILY WITH MEALS    Mounjaro   12.5 mg Weekly   Omega-3 FISH OIL 1200 MG  Daily   Vitamin C. 500 mg One tablet daily.    Risankizumab-rzaa,  150 MG , 75 MG/0.83ML   Inject every 12 weeks   rosuvastatin  20 MG tablet TAKE 1 TABLET AT BEDTIME   traZODone 150 MG tablet TAKE  1 TABLET BEFORE BEDTIME     No Known Allergies   PMHx:   Past Medical History:  Diagnosis Date   Arthritis    Crohn's colitis (HCC)    Elevated LFTs    Hx of colonic polyp    Hyperlipidemia    Hypertension    Hypogonadism male    Osteoporosis    Psoriatic arthritis (HCC)    Renal glycosuria (HCC)     Immunization History  Administered Date(s) Administered   DT (Pediatric) 03/17/2014   Moderna Sars-Covid-2 Vaccination 08/13/2019, 01/22/2020   PPD Test 07/15/2016, 01/22/2017   Pneumococcal Conjugate-13 09/04/2016   Pneumococcal Polysaccharide-23 12/10/2017   Pneumococcal-Unspecified 06/04/2003    Past Surgical History:  Procedure Laterality Date   COLONOSCOPY     POLYPECTOMY     TONSILLECTOMY     age [age]     FHx:    Reviewed / unchanged  SHx:    Reviewed / unchanged   Systems Review:  Constitutional: Denies fever, chills, wt changes, headaches, insomnia, fatigue, night sweats, change in appetite. Eyes: Denies redness, blurred vision, diplopia, discharge, itchy, watery eyes.  ENT: Denies discharge, congestion, post nasal drip, epistaxis, sore throat, earache, hearing loss, dental pain, tinnitus, vertigo, sinus pain, snoring.  CV: Denies chest  pain, palpitations, irregular heartbeat, syncope, dyspnea, diaphoresis, orthopnea, PND, claudication or edema. Respiratory: denies cough, dyspnea, DOE, pleurisy, hoarseness, laryngitis, wheezing.  Gastrointestinal: Denies dysphagia, odynophagia, heartburn, reflux, water brash, abdominal pain or cramps, nausea, vomiting, bloating, diarrhea, constipation, hematemesis, melena, hematochezia  or hemorrhoids. Genitourinary: Denies dysuria, frequency, urgency, nocturia, hesitancy, discharge, hematuria or flank pain. Musculoskeletal: Denies arthralgias, myalgias, stiffness, jt. swelling, pain, limping or strain/sprain.  Skin: Denies pruritus, rash, hives, warts, acne, eczema or change in skin lesion(s). Neuro: No weakness, tremor, incoordination, spasms, paresthesia or pain. Psychiatric: Denies confusion, memory loss or sensory loss. Endo: Denies change in weight, skin or hair change.  Heme/Lymph: No excessive bleeding, bruising or enlarged lymph nodes.  Physical Exam  BP 134/86   Pulse 83   Temp 97.8 F (36.6 C)   Ht 5\' 9"  (1.753 m)   Wt 178 lb 6.4 oz (80.9 kg)   SpO2 98%   BMI 26.35 kg/m   Appears  well nourished, well groomed  and in no distress.  Eyes: PERRLA, EOMs, conjunctiva no swelling or erythema. Sinuses: No frontal/maxillary tenderness ENT/Mouth: EAC's clear, TM's nl w/o erythema, bulging. Nares clear w/o erythema, swelling, exudates. Oropharynx clear without erythema or exudates. Oral hygiene is good. Tongue normal, non obstructing. Hearing intact.  Neck: Supple. Thyroid not palpable. Car 2+/2+ without bruits, nodes or JVD. Chest: Respirations nl with BS clear & equal w/o rales, rhonchi, wheezing or stridor.  Cor: Heart sounds normal w/ regular rate and rhythm without sig. murmurs, gallops, clicks or rubs. Peripheral pulses normal and equal  without edema.  Abdomen: Soft & bowel sounds normal. Non-tender w/o guarding, rebound, hernias, masses or organomegaly.  Lymphatics:  Unremarkable.  Musculoskeletal: Full ROM all peripheral extremities, joint stability, 5/5 strength and normal gait.  Skin: Warm, dry without exposed rashes, lesions or ecchymosis apparent.  Neuro: Cranial nerves intact, reflexes equal bilaterally. Sensory-motor testing grossly intact. Tendon reflexes grossly intact.  Pysch: Alert & oriented x 3.  Insight and judgement nl & appropriate. No ideations.  Assessment and Plan:  1. Essential hypertension  - Continue medication, monitor blood pressure at home.  - Continue DASH diet.  Reminder to go to the ER if any CP,  SOB, nausea, dizziness, severe HA, changes  vision/speech.  - CBC with Differential/Platelet - COMPLETE METABOLIC PANEL WITH GFR - Magnesium - TSH  2. Hyperlipidemia associated with type 2 diabetes mellitus (HCC)  - Continue diet/meds, exercise & lifestyle modifications.  - Continue monitor periodic cholesterol/liver & renal functions   - Lipid panel - TSH  3. Type 2 diabetes mellitus with stage 2 chronic kidney  disease, without long-term current use of insulin (HCC)  - Continue diet, exercise  - Lifestyle modifications.  - Monitor appropriate labs.  - Hemoglobin A1c - Insulin, random  4. Vitamin D deficiency  - Continue supplementation.  - VITAMIN D 25 Hydroxy   5. Psoriatic arthritis (HCC)   6. Crohn's disease without complication (HCC)   7. Medication management  - CBC with Differential/Platelet - COMPLETE METABOLIC PANEL WITH GFR - Magnesium - Lipid panel - TSH - Hemoglobin A1c - Insulin, random - VITAMIN D 25 Hydroxy        Discussed  regular exercise, BP monitoring, weight control to achieve/maintain BMI less than 25 and discussed med and SE's. Recommended labs to assess and monitor clinical status with further disposition pending results of labs.  I discussed the assessment and treatment plan with the patient. The patient was provided an opportunity to ask questions and all were answered. The  patient agreed with the plan and demonstrated an understanding of the instructions.  I provided over 30 minutes of exam, counseling, chart review and  complex critical decision making.         The patient was advised to call back or seek an in-person evaluation if the symptoms worsen or if the condition fails to improve as anticipated.   Marinus Maw, MD.

## 2022-12-31 ENCOUNTER — Encounter: Payer: Self-pay | Admitting: Internal Medicine

## 2022-12-31 ENCOUNTER — Ambulatory Visit (INDEPENDENT_AMBULATORY_CARE_PROVIDER_SITE_OTHER): Payer: Medicare Other | Admitting: Internal Medicine

## 2022-12-31 VITALS — BP 134/86 | HR 83 | Temp 97.8°F | Ht 69.0 in | Wt 178.4 lb

## 2022-12-31 DIAGNOSIS — E1169 Type 2 diabetes mellitus with other specified complication: Secondary | ICD-10-CM | POA: Diagnosis not present

## 2022-12-31 DIAGNOSIS — I10 Essential (primary) hypertension: Secondary | ICD-10-CM

## 2022-12-31 DIAGNOSIS — Z79899 Other long term (current) drug therapy: Secondary | ICD-10-CM

## 2022-12-31 DIAGNOSIS — E1122 Type 2 diabetes mellitus with diabetic chronic kidney disease: Secondary | ICD-10-CM | POA: Diagnosis not present

## 2022-12-31 DIAGNOSIS — L4050 Arthropathic psoriasis, unspecified: Secondary | ICD-10-CM

## 2022-12-31 DIAGNOSIS — E559 Vitamin D deficiency, unspecified: Secondary | ICD-10-CM | POA: Diagnosis not present

## 2022-12-31 DIAGNOSIS — N182 Chronic kidney disease, stage 2 (mild): Secondary | ICD-10-CM

## 2022-12-31 DIAGNOSIS — K5090 Crohn's disease, unspecified, without complications: Secondary | ICD-10-CM | POA: Diagnosis not present

## 2022-12-31 DIAGNOSIS — E785 Hyperlipidemia, unspecified: Secondary | ICD-10-CM | POA: Diagnosis not present

## 2023-01-01 NOTE — Progress Notes (Signed)
^<^<^<^<^<^<^<^<^<^<^<^<^<^<^<^<^<^<^<^<^<^<^<^<^<^<^<^<^<^<^<^<^<^<^<^<^ ^>^>^>^>^>^>^>^>^>^>^>>^>^>^>^>^>^>^>^>^>^>^>^>^>^>^>^>^>^>^>^>^>^>^>^>^>  -  Test results slightly outside the reference range are not unusual. If there is anything important, I will review this with you,  otherwise it is considered normal test values.  If you have further questions,  please do not hesitate to contact me at the office or via My Chart.   ^<^<^<^<^<^<^<^<^<^<^<^<^<^<^<^<^<^<^<^<^<^<^<^<^<^<^<^<^<^<^<^<^<^<^<^<^ ^>^>^>^>^>^>^>^>^>^>^>^>^>^>^>^>^>^>^>^>^>^>^>^>^>^>^>^>^>^>^>^>^>^>^>^>^  -  CBC shows WBC is slightly elevated,                                          so if you develop signs of infection, please  contact me   ^>^>^>^>^>^>^>^>^>^>^>^>^>^>^>^>^>^>^>^>^>^>^>^>^>^>^>^>^>^>^>^>^>^>^>^>^  - Chol = 132   &  LDL = 43  -    Both   Excellent   - Very low risk for Heart Attack  / Stroke  ^>^>^>^>^>^>^>^>^>^>^>^>^>^>^>^>^>^>^>^>^>^>^>^>^>^>^>^>^>^>^>^>^>^>^>^>^ ^>^>^>^>^>^>^>^>^>^>^>^>^>^>^>^>^>^>^>^>^>^>^>^>^>^>^>^>^>^>^>^>^>^>^>^>^  -  But Triglycerides (  =  231 ) or fats in blood are too high                 (   Ideal or  Goal is less than 150  !  )    - Recommend avoid fried & greasy foods,  sweets / candy,   - Avoid white rice  (brown or wild rice or Quinoa is OK),   - Avoid white potatoes  (sweet potatoes are OK)   - Avoid anything made from white flour  - bagels, doughnuts, rolls, buns, biscuits, white and   wheat breads, pizza crust and traditional  pasta made of white flour & egg white  - (vegetarian pasta or spinach or wheat pasta is OK).    - Multi-grain bread is OK - like multi-grain flat bread or  sandwich thins.   - Avoid alcohol in excess.   - Exercise is also important. ^>^>^>^>^>^>^>^>^>^>^>^>^>^>^>^>^>^>^>^>^>^>^>^>^>^>^>^>^>^>^>^>^>^>^>^>^ ^>^>^>^>^>^>^>^>^>^>^>^>^>^>^>^>^>^>^>^>^>^>^>^>^>^>^>^>^>^>^>^>^>^>^>^>^  -   Vitamin D = 60   - Great  !      Please  keep dose same                                      ^>^>^>^>^>^>^>^>^>^>^>^>^>^>^>^>^>^>^>^>^>^>^>^>^>^>^>^>^>^>^>^>^>^>^>^>^  - All Else - CBC - Kidneys - Electrolytes - Liver - Magnesium & Thyroid    - all  Normal / OK ^>^>^>^>^>^>^>^>^>^>^>^>^>^>^>^>^>^>^>^>^>^>^>^>^>^>^>^>^>^>^>^>^>^>^>^>^ ^>^>^>^>^>^>^>^>^>^>^>^>^>^>^>^>^>^>^>^>^>^>^>^>^>^>^>^>^>^>^>^>^>^>^>^>^

## 2023-01-23 ENCOUNTER — Encounter: Payer: Self-pay | Admitting: Internal Medicine

## 2023-01-24 ENCOUNTER — Other Ambulatory Visit: Payer: Self-pay | Admitting: Internal Medicine

## 2023-01-24 DIAGNOSIS — M549 Dorsalgia, unspecified: Secondary | ICD-10-CM

## 2023-01-24 MED ORDER — AMOXICILLIN 500 MG PO CAPS: ORAL_CAPSULE | ORAL | 3 refills | Status: DC

## 2023-01-24 MED ORDER — GABAPENTIN 300 MG PO CAPS
ORAL_CAPSULE | ORAL | 0 refills | Status: DC
Start: 2023-01-24 — End: 2023-02-21

## 2023-01-24 MED ORDER — DEXAMETHASONE 4 MG PO TABS
ORAL_TABLET | ORAL | 0 refills | Status: DC
Start: 2023-01-24 — End: 2023-02-21

## 2023-01-29 ENCOUNTER — Encounter: Payer: Medicare Other | Admitting: Internal Medicine

## 2023-02-21 ENCOUNTER — Encounter: Payer: Self-pay | Admitting: Internal Medicine

## 2023-02-21 ENCOUNTER — Ambulatory Visit (INDEPENDENT_AMBULATORY_CARE_PROVIDER_SITE_OTHER): Payer: Medicare Other | Admitting: Internal Medicine

## 2023-02-21 DIAGNOSIS — M549 Dorsalgia, unspecified: Secondary | ICD-10-CM | POA: Diagnosis not present

## 2023-02-21 MED ORDER — GABAPENTIN 300 MG PO CAPS
ORAL_CAPSULE | ORAL | 1 refills | Status: AC
Start: 2023-02-21 — End: ?

## 2023-02-21 MED ORDER — DEXAMETHASONE 4 MG PO TABS
ORAL_TABLET | ORAL | 0 refills | Status: DC
Start: 2023-02-21 — End: 2023-04-08

## 2023-02-21 NOTE — Progress Notes (Unsigned)
Future Appointments  Date Time Provider Department  02/21/2023 10:30 AM Lucky Cowboy, MD [facility]  04/08/2023  3:30 PM Lucky Cowboy, MD [facility]  07/10/2023  3:30 PM Adela Glimpse, NP [facility]    History of Present Illness:        This very nice 77 y.o. MWM  with HTN, HLD, T2_NIDDM, Psoriatic Arthritis and Vitamin D Deficiency presents with c/o         Current Outpatient Medications on File Prior to Visit  Medication Sig   ALPRAZolam (XANAX) 0.5 MG tablet TAKE 1/2 TO 1 TABLET BY MOUTH EVERY NIGHT AT BEDTIME AS NEEDED FOR SLEEP. USE ONLY 5 DAYS A WEEK   amoxicillin (AMOXIL) 500 MG capsule Take  1 capsule  3 x /day  for Dental Infection   aspirin 81 MG tablet Take 81 mg by mouth daily.   Calcium Carbonate (CALCIUM 600 PO) Take 600 mg by mouth daily.   Cholecalciferol (VITAMIN D) 125 MCG (5000 UT) CAPS Take by mouth.   dexamethasone (DECADRON) 4 MG tablet Take 1 tab 3 x day - 3 days, then 2 x day - 3 days, then 1 tab daily   fenofibrate 160 MG tablet Take  1 tablet Daily for Triglycerides (Blood Fats)                                  /                                          TAKE                                             BY                               MOUTH   gabapentin (NEURONTIN) 300 MG capsule Take   1 capsule   3 x / day  for Pain   losartan (COZAAR) 50 MG tablet Take  1 tablet  Daily  for BP & Diabetic  Kidney protection   Magnesium 500 MG CAPS Take 500 mg by mouth daily.   metFORMIN (GLUCOPHAGE-XR) 500 MG 24 hr tablet Take  2 tablets  2 x /day  with Meals  for Diabetes                                                               /                                                             TAKE  BY                                           MOUTH   Omega-3 Fatty Acids (FISH OIL) 1200 MG CAPS Take by mouth daily.    OVER THE COUNTER MEDICATION Vitamin C. 500 mg. One tablet daily.   Risankizumab-rzaa,150 MG  Dose, 75 MG/0.83ML PSKT Inject into the skin. Inject every 12 weeks   rosuvastatin (CRESTOR) 20 MG tablet TAKE 1 TABLET(20 MG) BY MOUTH AT BEDTIME   tirzepatide (MOUNJARO) 7.5 MG/0.5ML Pen Inject 7.5 mg into the skin once a week. (Patient taking differently: Inject 15 mg into the skin once a week.)   traZODone (DESYREL) 150 MG tablet TAKE 1 TABLET BY MOUTH EVERY NIGHT 1 HOUR BEFORE BEDTIME AS NEEDED FOR SLEEP   No current facility-administered medications on file prior to visit.    No Known Allergies   Problem list He has Hypertension; Psoriatic arthritis (HCC); Hyperlipidemia associated with type 2 diabetes mellitus (HCC); Osteoporosis; Fatty liver; Hx of colonic polyp; Medication management; Vitamin D deficiency; Encounter for Medicare annual wellness exam; Multiple atypical nevi; Crohn disease (HCC); Overweight (BMI 25.0-29.9); T2DM (type 2 diabetes mellitus) (HCC); CKD stage 2 due to type 2 diabetes mellitus (HCC); Insomnia; and Microalbuminuria due to type 2 diabetes mellitus (HCC) on their problem list.   Observations/Objective:  There were no vitals taken for this visit.  HEENT - WNL. Neck - supple.  Chest - Clear equal BS. Cor - Nl HS. RRR w/o sig MGR. PP 1(+). No edema. MS- FROM w/o deformities.  Gait Nl. Neuro -  Nl w/o focal abnormalities.   Assessment and Plan:      Follow Up Instructions:        I discussed the assessment and treatment plan with the patient. The patient was provided an opportunity to ask questions and all were answered. The patient agreed with the plan and demonstrated an understanding of the instructions.       The patient was advised to call back or seek an in-person evaluation if the symptoms worsen or if the condition fails to improve as anticipated.    Marinus Maw, MD

## 2023-02-27 ENCOUNTER — Encounter: Payer: Medicare Other | Admitting: Internal Medicine

## 2023-03-03 ENCOUNTER — Ambulatory Visit (INDEPENDENT_AMBULATORY_CARE_PROVIDER_SITE_OTHER): Payer: Medicare Other | Admitting: Internal Medicine

## 2023-03-03 ENCOUNTER — Ambulatory Visit: Payer: Medicare Other | Admitting: Internal Medicine

## 2023-03-03 ENCOUNTER — Encounter: Payer: Self-pay | Admitting: Internal Medicine

## 2023-03-03 VITALS — BP 138/68 | HR 70 | Temp 97.9°F | Resp 16 | Ht 69.0 in | Wt 159.2 lb

## 2023-03-03 DIAGNOSIS — R5381 Other malaise: Secondary | ICD-10-CM

## 2023-03-03 DIAGNOSIS — R531 Weakness: Secondary | ICD-10-CM | POA: Diagnosis not present

## 2023-03-03 NOTE — Progress Notes (Signed)
Future Appointments  Date Time Provider Department  04/08/2023                 3 mo mo  3:30 PM Lucky Cowboy, MD [facility]  07/10/2023                  6 mo Sudie Grumbling Joan Flores  3:30 PM Adela Glimpse, NP [facility]    History of Present Illness:            This very nice 77 y.o. MWM with HTN, HLD, T2_NIDDM, Psoriatic Arthritis and Vitamin D Deficiency   presents for follow up  to90 discuss recent flare-up of sciatica & subsequent improvement.   Now report feeling deconditioned with generalized loss of strength & stamina. Denies ay CP or dyspnea.  Systems review is negative.    Current Outpatient Medications on File Prior to Visit  Medication Sig   ALPRAZolam (XANAX) 0.5 MG tablet TAKE 1/2 TO 1 TABLET  EVERY NIGHT AT BEDTIME AS NEEDED FOR SLEEP. USE ONLY 5 DAYS A WEEK   aspirin 81 MG tablet Take 81 mg  daily.   Calcium Carbonate (CALCIUM 600 PO) Take 600 mg daily.   Cholecalciferol (VITAMIN D) 125 MCG (5000 UT) CAPS Take by mouth.   dexamethasone (DECADRON) 4 MG tablet Take 1 tab 3 x day - 3 days, then 2 x day - 3 days, then 1 tab daily   fenofibrate 160 MG tablet Take  1 tablet Daily    gabapentin (NEURONTIN) 300 MG capsule Take   1 capsule   3 x / day  for Pain   losartan (COZAAR) 50 MG tablet Take  1 tablet  Daily     Magnesium 500 MG CAPS Take 500 mg  daily.   metFORMIN -XR 500 MG  Take  2 tablets  2 x /day     Omega-3 FISH OIL) 1200 MG CAPS Take b daily.    Vitamin C  500 mg . One tablet daily.   Risankizumab-rzaa,150 MG Dose, 75 MG/0.83ML PSKT Inject into the skin. Inject every 12 weeks   rosuvastatin (CRESTOR) 20 MG tablet TAKE 1 TABLET AT BEDTIME   traZODone (DESYREL) 150 MG tablet TAKE 1 TABLET  EVERY NIGHT 1 HOUR BEFORE BEDTIME AS NEEDED FOR SLEEP   amoxicillin (AMOXIL) 500 MG capsule Take  1 capsule  3 x /day  for Dental Infection (Patient not taking: Reported on 02/21/2023)   tirzepatide (MOUNJARO) 7.5 MG/0.5ML Pen Inject 7.5 mg into the skin once a week. (Patient not  taking: Reported on 03/03/2023)    No Known Allergies   Problem list He has Hypertension; Psoriatic arthritis (HCC); Hyperlipidemia associated with type 2 diabetes mellitus (HCC); Osteoporosis; Fatty liver; Hx of colonic polyp; Medication management; Vitamin D deficiency; Encounter for Medicare annual wellness exam; Multiple atypical nevi; Crohn disease (HCC); Overweight (BMI 25.0-29.9); T2DM (type 2 diabetes mellitus) (HCC); CKD stage 2 due to type 2 diabetes mellitus (HCC); Insomnia; and Microalbuminuria due to type 2 diabetes mellitus (HCC) on their problem list.   Observations/Objective:  BP 138/68   Pulse 70   Temp 97.9 F (36.6 C)   Resp 16   Ht 5\' 9"  (1.753 m)   Wt 159 lb 3.2 oz (72.2 kg)   SpO2 98%   BMI 23.51 kg/m   HEENT - WNL. Neck - supple.  Chest - Clear equal BS. Cor - Nl HS. RRR w/o sig MGR. PP 1(+). No edema. MS- FROM w/o deformities.  Gait Nl.  Generalized decrease in muscle power , tone & bulk.  Neuro -  Nl w/o focal abnormalities.   Assessment and Plan:   1. Weakness generalized   2. Physical deconditioning  Discussed graduated exercise reginen with a "peddler" and small weights 2  & 3 lbs for reps .    Follow Up Instructions:        I discussed the assessment and treatment plan with the patient. The patient was provided an opportunity to ask questions and all were answered. The patient agreed with the plan and demonstrated an understanding of the instructions.       The patient was advised to call back or seek an in-person evaluation if the symptoms worsen or if the condition fails to improve as anticipated.    Marinus Maw, MD

## 2023-03-11 ENCOUNTER — Other Ambulatory Visit: Payer: Self-pay | Admitting: Internal Medicine

## 2023-04-02 ENCOUNTER — Encounter: Payer: Self-pay | Admitting: Internal Medicine

## 2023-04-03 ENCOUNTER — Other Ambulatory Visit: Payer: Self-pay | Admitting: Nurse Practitioner

## 2023-04-03 DIAGNOSIS — I10 Essential (primary) hypertension: Secondary | ICD-10-CM

## 2023-04-03 DIAGNOSIS — E1129 Type 2 diabetes mellitus with other diabetic kidney complication: Secondary | ICD-10-CM

## 2023-04-03 MED ORDER — LOSARTAN POTASSIUM 50 MG PO TABS
ORAL_TABLET | ORAL | 3 refills | Status: AC
Start: 2023-04-03 — End: ?

## 2023-04-08 ENCOUNTER — Ambulatory Visit (INDEPENDENT_AMBULATORY_CARE_PROVIDER_SITE_OTHER): Payer: Medicare Other | Admitting: Internal Medicine

## 2023-04-08 ENCOUNTER — Encounter: Payer: Self-pay | Admitting: Internal Medicine

## 2023-04-08 VITALS — BP 104/60 | HR 93 | Temp 97.9°F | Resp 16 | Ht 69.0 in | Wt 160.2 lb

## 2023-04-08 DIAGNOSIS — E559 Vitamin D deficiency, unspecified: Secondary | ICD-10-CM

## 2023-04-08 DIAGNOSIS — Z79899 Other long term (current) drug therapy: Secondary | ICD-10-CM | POA: Diagnosis not present

## 2023-04-08 DIAGNOSIS — L4050 Arthropathic psoriasis, unspecified: Secondary | ICD-10-CM | POA: Diagnosis not present

## 2023-04-08 DIAGNOSIS — K5090 Crohn's disease, unspecified, without complications: Secondary | ICD-10-CM

## 2023-04-08 DIAGNOSIS — E785 Hyperlipidemia, unspecified: Secondary | ICD-10-CM

## 2023-04-08 DIAGNOSIS — N182 Chronic kidney disease, stage 2 (mild): Secondary | ICD-10-CM | POA: Diagnosis not present

## 2023-04-08 DIAGNOSIS — E1122 Type 2 diabetes mellitus with diabetic chronic kidney disease: Secondary | ICD-10-CM

## 2023-04-08 DIAGNOSIS — I10 Essential (primary) hypertension: Secondary | ICD-10-CM

## 2023-04-08 DIAGNOSIS — E1169 Type 2 diabetes mellitus with other specified complication: Secondary | ICD-10-CM

## 2023-04-08 NOTE — Progress Notes (Signed)
Future Appointments  Date Time Provider Department  04/08/2023                     3 mo ov   3:30 PM Lucky Cowboy, MD [facility]  07/10/2023                     6 mo ov -wellness   3:30 PM Adela Glimpse, NP [facility]    History of Present Illness:       This very nice 77 y.o. MWM presents for 3  month follow up with HTN, HLD, T2_NIDDM, Psoriatic Arthritis and Vitamin D Deficiency.    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  He is taking Risankizumab injections Q12weeks and follows with his dermatologist  - Dr Feldman.]]         Patient is treated for HTN (2013) & BP has been controlled at home. Today's BP is at goal -  104/60  . Patient has had no complaints of any cardiac type chest pain, palpitations, dyspnea Pollyann Kennedy /PND, dizziness, claudication or dependent edema.        Hyperlipidemia is controlled with diet & Rosuvastatin. Patient denies myalgias or other med SE's. Last Lipids were at goal except elevated Trig's:  Lab Results  Component Value Date   CHOL 132 12/31/2022   HDL 60 12/31/2022   LDLCALC 43 12/31/2022   TRIG 231 (H) 12/31/2022   CHOLHDL 2.2 12/31/2022     Also, the patient has history of T2_NIDDM (A1c  6.5% /2012) w/CKD2  (GFR 87 ) and he managed with diet until required starting Metformin in Feb 2021.  Patient has had no symptoms of reactive hypoglycemia, diabetic polys, paresthesias or visual blurring.  Last A1c was  at goal:  Lab Results  Component Value Date   HGBA1C 5.6 12/31/2022         Further, the patient also has history of Vitamin D Deficiency and supplements vitamin D without any suspected side-effects. Last vitamin D was at goal:  Lab Results  Component Value Date   VD25OH 60 12/31/2022       Current Outpatient Medications  Medication Instructions    ALPRAZolam (XANAX) 0.5 MG tablet TAKE 1/2 TO 1 TABLET AT BEDTIME ONLY AS NEEDED    Aspirin  81 mg Daily   CALCIUM 600 Daily   VITAMIN D  5000 u  Daily    fenofibrate 160 MG tablet Take  1 tablet Daily f   losartan (COZAAR) 50 MG tablet Take 1 tab daily for blood pressure and diabetes kidney protection.   Magnesium 500 mg, Daily   metFORMIN -XR) 500 MG  TAKE 2 TABLETS  TWICE DAILY WITH MEALS    Mounjaro   12.5 mg Weekly   Omega-3 FISH OIL 1200 MG CAPS Daily   Vitamin C. 500 mg One tablet daily.    Risankizumab-rzaa,  150 MG , 75 MG/0.83ML   Inject every 12 weeks   rosuvastatin  20 MG tablet TAKE 1 TABLET(20 MG) BY MOUTH AT BEDTIME   traZODone 150 MG tablet TAKE 1 TABLET BEFORE BEDTIME      No Known Allergies  PMHx:   Past Medical History:  Diagnosis Date   Arthritis    Crohn's colitis (HCC)    Elevated LFTs    Hx of  colonic polyp    Hyperlipidemia    Hypertension    Hypogonadism male    Osteoporosis    Psoriatic arthritis (HCC)    Renal glycosuria (HCC)      Immunization History  Administered Date(s) Administered   DT 03/17/2014   Moderna Sars-Covid-2 Vacc 08/13/2019, 01/22/2020   PPD Test 07/15/2016, 01/22/2017   Pneumococcal -13 09/04/2016   Pneumococcal -23 12/10/2017   Pneumococcal-23 06/04/2003     Past Surgical History:  Procedure Laterality Date   COLONOSCOPY     POLYPECTOMY     TONSILLECTOMY     age [age]      FHx:    Reviewed / unchanged   SHx:    Reviewed / unchanged    Systems Review:  Constitutional: Denies fever, chills, wt changes, headaches, insomnia, fatigue, night sweats, change in appetite. Eyes: Denies redness, blurred vision, diplopia, discharge, itchy, watery eyes.  ENT: Denies discharge, congestion, post nasal drip, epistaxis, sore throat, earache, hearing loss, dental pain, tinnitus, vertigo, sinus pain, snoring.  CV: Denies chest pain, palpitations, irregular heartbeat, syncope, dyspnea, diaphoresis, orthopnea, PND, claudication  or edema. Respiratory: denies cough, dyspnea, DOE, pleurisy, hoarseness, laryngitis, wheezing.  Gastrointestinal: Denies dysphagia, odynophagia, heartburn, reflux, water brash, abdominal pain or cramps, nausea, vomiting, bloating, diarrhea, constipation, hematemesis, melena, hematochezia  or hemorrhoids. Genitourinary: Denies dysuria, frequency, urgency, nocturia, hesitancy, discharge, hematuria or flank pain. Musculoskeletal: Denies arthralgias, myalgias, stiffness, jt. swelling, pain, limping or strain/sprain.  Skin: Denies pruritus, rash, hives, warts, acne, eczema or change in skin lesion(s). Neuro: No weakness, tremor, incoordination, spasms, paresthesia or pain. Psychiatric: Denies confusion, memory loss or sensory loss. Endo: Denies change in weight, skin or hair change.  Heme/Lymph: No excessive bleeding, bruising or enlarged lymph nodes.  Physical Exam  BP 104/60   Pulse 93   Temp 97.9 F (36.6 C)   Resp 16   Ht 5\' 9"  (1.753 m)   Wt 160 lb 3.2 oz (72.7 kg)   SpO2 96%   BMI 23.66 kg/m   Appears  well nourished, well groomed  and in no distress.  Eyes: PERRLA, EOMs, conjunctiva no swelling or erythema. Sinuses: No frontal/maxillary tenderness ENT/Mouth: EAC's clear, TM's nl w/o erythema, bulging. Nares clear w/o erythema, swelling, exudates. Oropharynx clear without erythema or exudates. Oral hygiene is good. Tongue normal, non obstructing. Hearing intact.  Neck: Supple. Thyroid not palpable. Car 2+/2+ without bruits, nodes or JVD. Chest: Respirations nl with BS clear & equal w/o rales, rhonchi, wheezing or stridor.  Cor: Heart sounds normal w/ regular rate and rhythm without sig. murmurs, gallops, clicks or rubs. Peripheral pulses normal and equal  without edema.  Abdomen: Soft & bowel sounds normal. Non-tender w/o guarding, rebound, hernias, masses or organomegaly.  Lymphatics: Unremarkable.  Musculoskeletal: Full ROM all peripheral extremities, joint stability, 5/5  strength and normal gait.  Skin: Warm, dry without exposed rashes, lesions or ecchymosis apparent.  Neuro: Cranial nerves intact, reflexes equal bilaterally. Sensory-motor testing grossly intact. Tendon reflexes grossly intact.  Pysch: Alert & oriented x 3.  Insight and judgement nl & appropriate. No ideations.  Assessment and Plan:  1. Essential hypertension  - Continue medication, monitor blood pressure at home.  - Continue DASH diet.  Reminder to go to the ER if any CP,  SOB, nausea, dizziness, severe HA, changes vision/speech.  - CBC with Differential/Platelet - COMPLETE METABOLIC PANEL WITH GFR - Magnesium - TSH   2. Hyperlipidemia associated with type 2 diabetes mellitus (HCC)  - Continue diet/meds, exercise,& lifestyle  modifications.  - Continue monitor periodic cholesterol/liver & renal functions   - Lipid panel - TSH   3. Type 2 diabetes mellitus with stage 2 chronic kidney  disease, without long-term current use of insulin (HCC)  - Continue diet, exercise  - Lifestyle modifications.  - Monitor appropriate labs.  - Hemoglobin A1c - Insulin, random   4. Vitamin D deficiency  - Continue supplementation.  - VITAMIN D 25 Hydroxy    5. Psoriatic arthritis (HCC)   6. Crohn's disease without complication (HCC)    7. Medication management  - CBC with Differential/Platelet - COMPLETE METABOLIC PANEL WITH GFR - Magnesium - Lipid panel - TSH - Hemoglobin A1c - Insulin, random - VITAMIN D 25 Hydroxy          Discussed  regular exercise, BP monitoring, weight control to achieve/maintain BMI less than 25 and discussed med and SE's. Recommended labs to assess and monitor clinical status with further disposition pending results of labs.  I discussed the assessment and treatment plan with the patient. The patient was provided an opportunity to ask questions and all were answered. The patient agreed with the plan and demonstrated an understanding of the  instructions.  I provided over 30 minutes of exam, counseling, chart review and  complex critical decision making.         The patient was advised to call back or seek an in-person evaluation if the symptoms worsen or if the condition fails to improve as anticipated.   Marinus Maw, MD.

## 2023-04-08 NOTE — Patient Instructions (Signed)

## 2023-04-08 NOTE — Addendum Note (Signed)
Addended by: Lucky Cowboy on: 04/08/2023 10:45 PM   Modules accepted: Orders

## 2023-04-09 LAB — TSH: TSH: 0.64 m[IU]/L (ref 0.40–4.50)

## 2023-04-09 LAB — CBC WITH DIFFERENTIAL/PLATELET
Absolute Lymphocytes: 2477 {cells}/uL (ref 850–3900)
Absolute Monocytes: 720 {cells}/uL (ref 200–950)
Basophils Absolute: 38 {cells}/uL (ref 0–200)
Basophils Relative: 0.4 %
Eosinophils Absolute: 67 {cells}/uL (ref 15–500)
Eosinophils Relative: 0.7 %
HCT: 40 % (ref 38.5–50.0)
Hemoglobin: 13.8 g/dL (ref 13.2–17.1)
MCH: 32 pg (ref 27.0–33.0)
MCHC: 34.5 g/dL (ref 32.0–36.0)
MCV: 92.8 fL (ref 80.0–100.0)
MPV: 10.5 fL (ref 7.5–12.5)
Monocytes Relative: 7.5 %
Neutro Abs: 6298 {cells}/uL (ref 1500–7800)
Neutrophils Relative %: 65.6 %
Platelets: 420 10*3/uL — ABNORMAL HIGH (ref 140–400)
RBC: 4.31 10*6/uL (ref 4.20–5.80)
RDW: 12.7 % (ref 11.0–15.0)
Total Lymphocyte: 25.8 %
WBC: 9.6 10*3/uL (ref 3.8–10.8)

## 2023-04-09 LAB — COMPLETE METABOLIC PANEL WITH GFR
AG Ratio: 1.5 (calc) (ref 1.0–2.5)
ALT: 10 U/L (ref 9–46)
AST: 15 U/L (ref 10–35)
Albumin: 4.3 g/dL (ref 3.6–5.1)
Alkaline phosphatase (APISO): 33 U/L — ABNORMAL LOW (ref 35–144)
BUN/Creatinine Ratio: 31 (calc) — ABNORMAL HIGH (ref 6–22)
BUN: 29 mg/dL — ABNORMAL HIGH (ref 7–25)
CO2: 28 mmol/L (ref 20–32)
Calcium: 11.6 mg/dL — ABNORMAL HIGH (ref 8.6–10.3)
Chloride: 104 mmol/L (ref 98–110)
Creat: 0.95 mg/dL (ref 0.70–1.28)
Globulin: 2.8 g/dL (ref 1.9–3.7)
Glucose, Bld: 99 mg/dL (ref 65–99)
Potassium: 4.3 mmol/L (ref 3.5–5.3)
Sodium: 141 mmol/L (ref 135–146)
Total Bilirubin: 0.4 mg/dL (ref 0.2–1.2)
Total Protein: 7.1 g/dL (ref 6.1–8.1)
eGFR: 83 mL/min/{1.73_m2} (ref >=60)

## 2023-04-09 LAB — LIPID PANEL
Cholesterol: 140 mg/dL (ref <200)
HDL: 46 mg/dL (ref >=40)
LDL Cholesterol (Calc): 68 mg/dL
Non-HDL Cholesterol (Calc): 94 mg/dL (ref <130)
Total CHOL/HDL Ratio: 3 (calc) (ref <5.0)
Triglycerides: 179 mg/dL — ABNORMAL HIGH (ref <150)

## 2023-04-09 LAB — HEMOGLOBIN A1C
Hgb A1c MFr Bld: 5.3 %{Hb} (ref <5.7)
Mean Plasma Glucose: 105 mg/dL
eAG (mmol/L): 5.8 mmol/L

## 2023-04-09 LAB — INSULIN, RANDOM: Insulin: 13.8 u[IU]/mL

## 2023-04-09 LAB — MAGNESIUM: Magnesium: 2 mg/dL (ref 1.5–2.5)

## 2023-04-09 LAB — VITAMIN D 25 HYDROXY (VIT D DEFICIENCY, FRACTURES): Vit D, 25-Hydroxy: 77 ng/mL (ref 30–100)

## 2023-04-09 NOTE — Progress Notes (Signed)
<>*<>*<>*<>*<>*<>*<>*<>*<>*<>*<>*<>*<>*<>*<>*<>*<>*<>*<>*<>*<>*<>*<>*<>*<> <>*<>*<>*<>*<>*<>*<>*<>*<>*<>*<>*<>*<>*<>*<>*<>*<>*<>*<>*<>*<>*<>*<>*<>*<>  -  Test results slightly outside the reference range are not unusual. If there is anything important, I will review this with you,  otherwise it is considered normal test values.  If you have further questions,  please do not hesitate to contact me at the office or via My Chart.   <>*<>*<>*<>*<>*<>*<>*<>*<>*<>*<>*<>*<>*<>*<>*<>*<>*<>*<>*<>*<>*<>*<>*<>*<> <>*<>*<>*<>*<>*<>*<>*<>*<>*<>*<>*<>*<>*<>*<>*<>*<>*<>*<>*<>*<>*<>*<>*<>*<>  -  Glucose  Normal  - Kidney functions  - Normal  - Calcium slightly elevated,                           so avoid large amounts of calcium antacid as is found in "Tums"  <>*<>*<>*<>*<>*<>*<>*<>*<>*<>*<>*<>*<>*<>*<>*<>*<>*<>*<>*<>*<>*<>*<>*<>*<> <>*<>*<>*<>*<>*<>*<>*<>*<>*<>*<>*<>*<>*<>*<>*<>*<>*<>*<>*<>*<>*<>*<>*<>*<>  -  Chol = 140  -  Excellent   - Very low risk for Heart Attack  / Stroke   <>*<>*<>*<>*<>*<>*<>*<>*<>*<>*<>*<>*<>*<>*<>*<>*<>*<>*<>*<>*<>*<>*<>*<>*<> <>*<>*<>*<>*<>*<>*<>*<>*<>*<>*<>*<>*<>*<>*<>*<>*<>*<>*<>*<>*<>*<>*<>*<>*<  -  A1c - Normal - No Diabetes  - Great    <>*<>*<>*<>*<>*<>*<>*<>*<>*<>*<>*<>*<>*<>*<>*<>*<>*<>*<>*<>*<>*<>*<>*<>*<> <>*<>*<>*<>*<>*<>*<>*<>*<>*<>*<>*<>*<>*<>*<>*<>*<>*<>*<>*<>*<>*<>*<>*<>*<>  -  Vitamin D = 77 - Excellent  <>*<>*<>*<>*<>*<>*<>*<>*<>*<>*<>*<>*<>*<>*<>*<>*<>*<>*<>*<>*<>*<>*<>*<>*<> <>*<>*<>*<>*<>*<>*<>*<>*<>*<>*<>*<>*<>*<>*<>*<>*<>*<>*<>*<>*<>*<>*<>*<>*<>  -  All Else - CBC - Kidneys - Electrolytes - Liver - Magnesium & Thyroid    - all  Normal / OK  <>*<>*<>*<>*<>*<>*<>*<>*<>*<>*<>*<>*<>*<>*<>*<>*<>*<>*<>*<>*<>*<>*<>*<>*<> <>*<>*<>*<>*<>*<>*<>*<>*<>*<>*<>*<>*<>*<>*<>*<>*<>*<>*<>*<>*<>*<>*<>*<>*<>

## 2023-05-26 DIAGNOSIS — L4050 Arthropathic psoriasis, unspecified: Secondary | ICD-10-CM | POA: Diagnosis not present

## 2023-05-26 DIAGNOSIS — L409 Psoriasis, unspecified: Secondary | ICD-10-CM | POA: Diagnosis not present

## 2023-05-26 DIAGNOSIS — Z5181 Encounter for therapeutic drug level monitoring: Secondary | ICD-10-CM | POA: Diagnosis not present

## 2023-06-11 ENCOUNTER — Other Ambulatory Visit: Payer: Self-pay | Admitting: Nurse Practitioner

## 2023-06-11 DIAGNOSIS — F5101 Primary insomnia: Secondary | ICD-10-CM

## 2023-06-25 ENCOUNTER — Other Ambulatory Visit: Payer: Self-pay | Admitting: Internal Medicine

## 2023-07-10 ENCOUNTER — Ambulatory Visit: Payer: Medicare Other | Admitting: Nurse Practitioner

## 2023-07-18 DIAGNOSIS — K5090 Crohn's disease, unspecified, without complications: Secondary | ICD-10-CM | POA: Diagnosis not present

## 2023-07-18 DIAGNOSIS — E559 Vitamin D deficiency, unspecified: Secondary | ICD-10-CM | POA: Diagnosis not present

## 2023-07-18 DIAGNOSIS — Z1159 Encounter for screening for other viral diseases: Secondary | ICD-10-CM | POA: Diagnosis not present

## 2023-07-18 DIAGNOSIS — L409 Psoriasis, unspecified: Secondary | ICD-10-CM | POA: Diagnosis not present

## 2023-07-18 DIAGNOSIS — Z7984 Long term (current) use of oral hypoglycemic drugs: Secondary | ICD-10-CM | POA: Diagnosis not present

## 2023-07-18 DIAGNOSIS — E119 Type 2 diabetes mellitus without complications: Secondary | ICD-10-CM | POA: Diagnosis not present

## 2023-07-18 DIAGNOSIS — L4050 Arthropathic psoriasis, unspecified: Secondary | ICD-10-CM | POA: Diagnosis not present

## 2023-07-29 ENCOUNTER — Ambulatory Visit: Payer: Medicare Other | Admitting: Nurse Practitioner

## 2023-08-26 ENCOUNTER — Ambulatory Visit: Payer: Medicare Other | Admitting: Nurse Practitioner

## 2023-11-04 ENCOUNTER — Encounter: Payer: Medicare Other | Admitting: Internal Medicine

## 2023-12-19 ENCOUNTER — Encounter: Payer: Self-pay | Admitting: Advanced Practice Midwife
